# Patient Record
Sex: Male | Born: 1950 | Race: White | Hispanic: No | Smoking: Former smoker
Health system: Southern US, Community
[De-identification: ages and names within clinical notes are randomized; demographics above are authoritative.]

## PROBLEM LIST (undated history)

## (undated) DIAGNOSIS — F419 Anxiety disorder, unspecified: Secondary | ICD-10-CM

## (undated) DIAGNOSIS — N183 Chronic kidney disease, stage 3 unspecified: Secondary | ICD-10-CM

## (undated) DIAGNOSIS — IMO0001 Reserved for inherently not codable concepts without codable children: Secondary | ICD-10-CM

## (undated) DIAGNOSIS — I739 Peripheral vascular disease, unspecified: Secondary | ICD-10-CM

## (undated) DIAGNOSIS — Z95810 Presence of automatic (implantable) cardiac defibrillator: Secondary | ICD-10-CM

## (undated) DIAGNOSIS — I509 Heart failure, unspecified: Secondary | ICD-10-CM

## (undated) DIAGNOSIS — G629 Polyneuropathy, unspecified: Secondary | ICD-10-CM

## (undated) DIAGNOSIS — I2510 Atherosclerotic heart disease of native coronary artery without angina pectoris: Secondary | ICD-10-CM

## (undated) DIAGNOSIS — Z7901 Long term (current) use of anticoagulants: Secondary | ICD-10-CM

## (undated) DIAGNOSIS — I10 Essential (primary) hypertension: Secondary | ICD-10-CM

## (undated) DIAGNOSIS — E119 Type 2 diabetes mellitus without complications: Secondary | ICD-10-CM

## (undated) DIAGNOSIS — I4821 Permanent atrial fibrillation: Secondary | ICD-10-CM

## (undated) DIAGNOSIS — I447 Left bundle-branch block, unspecified: Secondary | ICD-10-CM

## (undated) DIAGNOSIS — N289 Disorder of kidney and ureter, unspecified: Secondary | ICD-10-CM

## (undated) DIAGNOSIS — I429 Cardiomyopathy, unspecified: Secondary | ICD-10-CM

## (undated) HISTORY — DX: Presence of automatic (implantable) cardiac defibrillator: Z95.810

## (undated) HISTORY — DX: Polyneuropathy, unspecified: G62.9

---

## 1995-02-20 DIAGNOSIS — I739 Peripheral vascular disease, unspecified: Secondary | ICD-10-CM

## 1995-02-20 HISTORY — DX: Peripheral vascular disease, unspecified: I73.9

## 1996-03-02 HISTORY — PX: CORONARY ARTERY BYPASS GRAFT: SHX141

## 1996-06-19 HISTORY — PX: OTHER SURGICAL HISTORY: SHX169

## 1996-06-30 HISTORY — PX: OTHER SURGICAL HISTORY: SHX169

## 1997-12-06 ENCOUNTER — Ambulatory Visit (HOSPITAL_COMMUNITY): Admission: RE | Admit: 1997-12-06 | Discharge: 1997-12-06 | Payer: Self-pay | Admitting: Family Medicine

## 1998-01-10 ENCOUNTER — Ambulatory Visit (HOSPITAL_COMMUNITY): Admission: RE | Admit: 1998-01-10 | Discharge: 1998-01-10 | Payer: Self-pay | Admitting: Family Medicine

## 1998-03-04 ENCOUNTER — Ambulatory Visit (HOSPITAL_COMMUNITY): Admission: RE | Admit: 1998-03-04 | Discharge: 1998-03-04 | Payer: Self-pay | Admitting: Family Medicine

## 1998-04-04 ENCOUNTER — Encounter (HOSPITAL_COMMUNITY): Admission: RE | Admit: 1998-04-04 | Discharge: 1998-07-03 | Payer: Self-pay | Admitting: Family Medicine

## 2002-08-19 ENCOUNTER — Ambulatory Visit (HOSPITAL_COMMUNITY): Admission: RE | Admit: 2002-08-19 | Discharge: 2002-08-19 | Payer: Self-pay | Admitting: *Deleted

## 2004-08-02 ENCOUNTER — Ambulatory Visit: Payer: Self-pay | Admitting: Oncology

## 2004-11-22 ENCOUNTER — Ambulatory Visit: Payer: Self-pay | Admitting: Oncology

## 2005-05-23 ENCOUNTER — Ambulatory Visit: Payer: Self-pay | Admitting: Oncology

## 2006-04-13 ENCOUNTER — Emergency Department (HOSPITAL_COMMUNITY): Admission: EM | Admit: 2006-04-13 | Discharge: 2006-04-14 | Payer: Self-pay | Admitting: Emergency Medicine

## 2007-06-27 ENCOUNTER — Inpatient Hospital Stay (HOSPITAL_COMMUNITY): Admission: AD | Admit: 2007-06-27 | Discharge: 2007-07-02 | Payer: Self-pay | Admitting: Cardiovascular Disease

## 2007-06-30 ENCOUNTER — Encounter (INDEPENDENT_AMBULATORY_CARE_PROVIDER_SITE_OTHER): Payer: Self-pay | Admitting: Cardiovascular Disease

## 2007-07-02 ENCOUNTER — Ambulatory Visit: Payer: Self-pay | Admitting: Psychiatry

## 2007-10-21 ENCOUNTER — Ambulatory Visit (HOSPITAL_COMMUNITY): Admission: RE | Admit: 2007-10-21 | Discharge: 2007-10-21 | Payer: Self-pay | Admitting: Cardiovascular Disease

## 2010-07-04 NOTE — Consult Note (Signed)
NAME:  Maxwell Fitzgerald, Maxwell Fitzgerald                 ACCOUNT NO.:  [id number]   MEDICAL RECORD NO.:  [id number]          PATIENT TYPE:  INP   LOCATION:  3734                         FACILITY:  [facility]   PHYSICIAN:  Anselm Jungling, MD  DATE OF BIRTH:  11-Jun-1950   DATE OF CONSULTATION:  07/02/2007  DATE OF DISCHARGE:  07/02/2007                                 CONSULTATION   IDENTIFYING DATA AND REASON FOR REFERRAL:  The patient is a 60 year old  divorced white male, currently under the care of Dr. Alanda Amass here at  [facility].  He was admitted due to atrial fibrillation with  rapid ventricular response.  Psychiatric consultation is requested  because of concerns about depression and possible suicidal ideation.   HISTORY OF PRESENTING PROBLEMS:  The following information is obtained  from his caregivers here, the [facility] chart and the patient himself,  who appears to be a reliable historian.   The patient has had numerous psychosocial stressors recently in addition  to his medical history, which includes adult onset diabetes mellitus,  coronary artery disease, past coronary artery bypass graft,  hyperlipidemia and the above referenced atrial fibrillation.  He and his  wife recently separated, but then reunited just a short while ago.  She  has had medical problems of her own and requires a significant amount of  caretaking on his part.  Together, they have two adult children and one  grandchild.  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  He has no health  insurance.   His primary care physician recently prescribed Ativan for him to help  him sleep, but at this time the patient is of the opinion that he  probably warrants treatment with an antidepressant medication.  There is  a positive family history of depression.  He has never been treated with  an antidepressant in the past.   With respect to the concerns  about suicidal ideation, the patient  explains, in quite a lot of detail, how 2-3 weeks ago he was feeling  much more down and hopeless about his overall situation, and he did  indeed make some comments at least twice to different persons he is  close to, indicating that he had had thoughts of taking a whole bunch  of pills and ending it all.  He states, however, that that feeling was  short-lived, and since then, he has had more reasons to feel hopeful and  positive about his situation.  He states that yesterday he started to  feel good again physically, and enjoyed walking around the hospital,  and visiting the solarium.  He has had positive contacts on the phone  with his daughter in [place], who is a health care professional and who  has been very supportive.  He states I have always had a positive  outlook on life.  He goes on to quote [employer], a statement  about the measure of a man being best gauged at the times they are  facing adversity.  He also  discusses the dire and adverse consequences  to his children and his wife in the event of suicide, and his love for  his 78-year-old grandchild.  He indicates that he does want to reach out  and get help for the stresses and challenges that he has facing him.   MENTAL STATUS AND OBSERVATIONS:  The patient is a well-nourished,  normally-developed adult male who was awake, alert and fully oriented.  He is very pleasant and an excellent historian.  He is articulate and of  above average intelligence and education.  His thoughts and speech are  normally organized.  There is nothing to suggest any underlying thought  disorder, cognitive or memory impairment.  He does not appear to be  clinically depressed.  His mood appears to be neutral with appropriate  affect.  As referenced above, he denies any suicidal ideation, plan or  intent at this time quite convincingly and sincerely and verbalizes a  strong desire for help.   IMPRESSION:   The patient is a 60 year old separated male with multiple  stressors and health conditions that have been further distressing to  him.  He was having some fleeting thoughts of suicide a couple of weeks  ago but since then he has begun to feel better but still verbalizes a  need for help and recognizes that help is available.   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  AXIS II:  Deferred.  AXIS III:  History of atrial fibrillation, diabetes mellitus, coronary  artery disease, hyperlipidemia.  AXIS IV:  Stressors severe.  AXIS V:  GAF 55.   RECOMMENDATIONS:  The patient and I discussed a trial of antidepressant  medication.  I recommended an SSRI antidepressant and he would like to  have one that he can purchase on a generic basis, since he has no health  insurance in force.  I suggested a trial of generic Zoloft or Prozac.   The patient can approach [facility] regarding the  possibility of psychotherapy services at reduced cost.   He appears to have very good supports.  In fact, as I was getting ready  to leave the pastor from his church came to visit and it appeared that  they were going to have a very good visit.   I have no other specific recommendations at this time.  I do not feel  that the one-to-one sitter is necessary.  Please return to the patient  his personal belongings and allow him to have a phone in his room.   I have no other specific recommendations.  Thank you for involving me in  this patient's care.  Cell phone [phone].      Anselm Jungling, MD  Electronically Signed     SPB/MEDQ  D:  07/02/2007  T:  07/02/2007  Job:  [id number]

## 2010-07-04 NOTE — H&P (Signed)
NAMEDELOIS, TOLBERT                 ACCOUNT NO.:  [id number]   MEDICAL RECORD NO.:  [id number]           PATIENT TYPE:   LOCATION:                                 FACILITY:   PHYSICIAN:  Richard A. Alanda Amass, M.D.  DATE OF BIRTH:   DATE OF ADMISSION:  DATE OF DISCHARGE:                              HISTORY & PHYSICAL   Mr. Oyama is a 60 year old separated and recently back together with his  estranged ill wife, white father of two with one grandchild. He quit  smoking 11 years ago. He has a history of AODM, coronary disease, remote  CABG, PAF, hyperlipidemia, and chronic anxiety and depression.   He is admitted now for symptomatic recurrent atrial fibrillation with  rapid ventricular response.   Joe has had significant stress problems, particularly over the last six  months. He is now in the middle of personal and corporate bankruptcy. He  has no health insurance, and he owes over $380,000. 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 He has been depressed, anxious, and got in touch with  Dr. Blair Heys office, and he was given Ativan to take 1 mg daily and also  started on an antidepressant as an outpatient, but he did not bring  this.   Over the last several weeks, he has had intermittent palpitations,  weakness and fatigue with no syncope or presyncope. He started having  persistent atrial fibrillation probable last night or today with  palpitations and fatigue. He has not had any chest pain or nitroglycerin  use. He is extremely anxious and distraught about his current situation  and need for hospitalization.   He has had a past episode of atrial fibrillation in February of 2008 and  was seen in the emergency room, and he was started with IV Cardizem. He  converted, and he was allowed to go home from there, starting on  Coumadin.   He was last seen by me November 2008 and had been taken off of his  Coumadin  since he had no atrial fibrillation from April 2008 to November  2008. He has not had any cerebral symptoms and nothing to suggest TIA or  CVA.   Gabriel Rung is status post remote CABG in 1998 by Dr. Andrey Campanile. He has not had  recatheterization since that time.   Cardiolite of March 2008 showed a low to intermediate risk scan with  septal and anterior wall suggested ischemia. EF was 41%. We did not  recommend catheterization because he was asymptomatic and doing well on  medical therapy.   He has not had a recent echocardiogram. There has not been any overt CHF  now or in the past.   He has had prior bilateral SVA PTAs February 18, 1996 with good long  term result. He had moderate left common, right external iliac disease  at that time.   He has asymptomatic carotid bruits and last carotid Dopplers were  December 2005 that showed no significant stenosis. He has not had recent  outpatient lower extremity Dopplers  because of cost considerations, but  he has not complained of any claudication.   He has recently moved back in with his estranged wife, and he is helping  to take care of her.   CURRENT MEDICATIONS:  1. Metformin 1 g b.i.d.  2. Actos 22.5 daily.  3. Lopressor 50 b.i.d.  4. Vasotec 30.  5. Zocor 20.  6. Slow Niacin 500 b.i.d.  7. Fish oil.  8. Chondroitin.  9. Zetia 10.  10.Two baby aspirin.  11.Norvasc 5.  12.Amaryl 2 mg b.i.d.  13.Lorazepam 1 mg daily.   The last lab I had available from November 2008 shows an H&H of 11.5/32,  platelet count of 204, PSA 1.3, A1c of 5.8.   BUN and creatinine were 33/1.5 with normal LFTs. Lipids showed  cholesterol 126, LDL 60, and HDL 38.   On exam, he is quite upset and anxious and does not want to come into  the hospital. I have explained to him the risks, benefits, and  alternatives, and I particularly explained the significant risk of  stroke associated with his atrial fibrillation along with current  symptoms and relatively low  blood pressure with atrial fibrillation with  rapid ventricular response.   He has chronic left bundle branch block with secondary ST-T wave  abnormalities.   There is no CHF on exam. Initial blood pressure was 150/70 by the  nurses. By me, it was 105/60. He is atrial fibrillation with rapid  ventricular response at 110 to 130 per minute.  The chest showed diminished breath sounds but no wheezes, rubs, or  rhonchi. PMI is in the left 6th ICS in the MCL, poorly sustained. There  is a tight paradoxical splitting of the second heart sound, a short 2/6  systolic murmur at the left sternal border. There is no diastolic murmur  or rubs. There is soft, bilateral carotid bruits. Thyroid is not  enlarged.  Liver, spleen and kidneys are not palpable.  Femorals are intact plus 2 on the left, plus 1 on the right. DP and PT  are palpable. There are no lower extremity ulcerations and no calf  tenderness. There are no pathologic reflexes or tremor. No active  arthritis or rash. He is fair skinned. Bearded.   EKG shows left bundle branch block with secondary ST-T changes, atrial  fibrillation with rapid ventricular response.   The patient is admitted to the hospital for rate control,  anticoagulation, and ancillary therapy. Fortunately, he is not having  any angina. He has associated comorbid conditions as outlined. He will  probably be agitating to go home very soon from the hospital, and he  might be able to put on Lovenox until his Coumadin is therapeutic. Since  he is not in hemodynamic problem except for borderline blood pressure at  present, I believe rate control will be adequate at present, and we can  consider possibility of cardioversion in the future. I would anticipate  keeping him in the hospital at least over the weekend or slightly  longer. However, once again, I am sure he will agitate to go home sooner  because of anxiety and cost considerations. He will need to be on  antianxiety  agents in the hospital as well, and we need to check a 2-D  echocardiogram in the hospital along with thyroid functions and his  diabetes.   ADMISSION DIAGNOSES:  1. Recurrent atrial fibrillation with rapid ventricular response -      symptomatic with low output symptoms. No chest  pain.  2. Left bundle branch block - chronic.  3. Coronary artery disease with coronary artery bypass grafting in      1998 - Dr. Andrey Campanile.  4. Mild ischemia on Cardiolite March 2008 without angina - low to      intermittent risk, treated medically.  5. Decreased ejection fraction 41% on last Cardiolite. No overt      congestive heart failure.  6. Remote bilateral percutaneous transluminal angioplasty May 1998.  7. Adult-onset diabetes mellitus.  8. Hyperlipidemia.  9. Severe anxiety and depression.  10.Hyperlipidemia.      Richard A. Alanda Amass, M.D.  Electronically Signed     RAW/MEDQ  D:  06/27/2007  T:  06/27/2007  Job:  [id number]

## 2010-07-04 NOTE — Discharge Summary (Signed)
Maxwell Fitzgerald, Maxwell Fitzgerald                 ACCOUNT NO.:  [id number]   MEDICAL RECORD NO.:  [id number]          PATIENT TYPE:  INP   LOCATION:  3734                         FACILITY:  [facility]   PHYSICIAN:  Maxwell Fitzgerald, M.D.DATE OF BIRTH:  1950/04/01   DATE OF ADMISSION:  06/27/2007  DATE OF DISCHARGE:  07/02/2007                               DISCHARGE SUMMARY   DISCHARGE DIAGNOSES:  1. Atrial fibrillation, controlled ventricular response.  2. Coumadin, initiated at this admission.  3. Coronary disease, coronary artery bypass grafting in 1998 by Dr.      Andrey Fitzgerald with mild ischemia and Cardiolite on March of 2008, low-to-      intermediate risks to be treated medically.  4. Ejection fraction of 45-50%.  5. Peripheral vascular disease with prior PTA.  6. Non-insulin-dependent diabetes.  7. Treated dyslipidemia.  8. Severe anxiety and depression, the patient was seen by Dr. Electa Fitzgerald      from [facility] in this admission.   HOSPITAL COURSE:  Maxwell Fitzgerald is a 60 year old male with history of  coronary disease, dyslipidemia, and problems with anxiety and depression  as noted above.  He has had a lot of personal stress including a  corporate bankruptcy and divorce.  He has no health insurance and has  huge health bills.  He was seen in the office by Dr. Alanda Fitzgerald on Jun 27, 2007, with intermittent palpitations, weakness, and fatigue.  He was  found to be in atrial fibrillation.  He had previous atrial fibrillation  in February 2008, and converted with Cardizem.  He had been on Coumadin  for the time, but this was later stopped in November 2008, and he did  not have recurrence of atrial fibrillation.  He has not had any symptoms  of TIA or CVA.  He was seen by Dr. Alanda Fitzgerald and admitted to telemetry  for plans for heparin and Coumadin and rate control.  During his  hospitalization, he had apparently complained to his family that he was  at the end of his rope and had mentioned  to them that he may be hoarding  some of his medicines to end it all.  We asked Dr. Electa Fitzgerald to see him.  He was placed on safety precautions overnight until he could be seen.  The patient was interviewed by Dr. Electa Fitzgerald on Jul 02, 2007.  His  dictation is pending, but he did indicate in a note that he felt the  patient could go home.  He suggested Zoloft or Prozac, whichever was  more affordable.  If he continues to have problems, that we may want to  refer him to [facility].  His INR on the 13th is 2.2,  and we feel like he can be discharged.   DISCHARGE MEDICATIONS:  1. Warfarin 5 mg a day with 7.5 on Monday, Wednesday, and Friday.  2,  Diltiazem CD 240 daily.  1. Metoprolol 50 mg b.i.d.  2. Enalapril 20 mg in the morning 10 mg in the p.m.  3. Simvastatin has been increased to 40 mg a day,  and we stopped the      Zetia for cost reasons.  4. Niacin 500 mg twice a day.  5. Actos 22.5 mg once a day.  6. Glucophage 1 g twice a day.  7. Amaryl 2 mg twice a day.  8. Prozac 20 mg a day at night.   LABORATORY DATA:  Echocardiogram in this admission showed the patient's  EF to be 40-50%; white count 6.8, hemoglobin 10.1, hematocrit 29.3, and  platelets 175.  Sodium 137, potassium 4.2, BUN 28, and creatinine 1.5.  CK-MB and troponins were negative.  Cholesterol is 97, LDL 56, and HDL  32.  Iron levels 40 and B12 levels 282.  Free thyroid index 3.4.  TSH  0.86.  Chest x-ray, no acute disease.  INR at discharge is 2.2.   DISPOSITION:  The patient is discharged in stable condition in atrial  fibrillation with controlled ventricular response.  He has a baseline  left bundle-branch block.  We will have him get a protime on Monday and  see Dr. Alanda Fitzgerald in followup.      Abelino Derrick, P.A.      Maxwell Fitzgerald, M.D.  Electronically Signed    LKK/MEDQ  D:  07/02/2007  T:  07/03/2007  Job:  [id number]   cc:   Maxwell Fitzgerald, M.D.  Quita Skye Artis Flock, M.D.

## 2010-07-04 NOTE — Cardiovascular Report (Signed)
NAMEYAPHET, SMETHURST                 ACCOUNT NO.:  [id number]   MEDICAL RECORD NO.:  [id number]          PATIENT TYPE:  OIB   LOCATION:  2899                         FACILITY:  [facility]   PHYSICIAN:  Richard A. Alanda Amass, M.D.DATE OF BIRTH:  07-21-1950   DATE OF PROCEDURE:  DATE OF DISCHARGE:  10/21/2007                            CARDIAC CATHETERIZATION   DIRECT CURRENT (DC) CARDIOVERSION   BRIEF HISTORY:  Mr. Busby is a 60 year old white married father of two  with one grandchild.  He had been separated from his wife but is back  with him now.  There is a significant complex social history involving  debt and bankruptcy and lack of insurance.  We have tried to work with  him.  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   Gabriel Rung has a history of coronary artery disease and remote CABG in 1998 by  Dr. Andrey Campanile.  He has not had any recurrent angina or symptoms of ischemia  although we had a borderline Cardiolite.  A year ago (March 2008), he  was asymptomatic and he declined recatheterization.  He has been stable  on medical therapy.   He has had remote bilateral SFA PTA in 1997 with good long-term result  without recurrence.   The patient has a history of chronic left bundle branch block,  hyperlipidemia, and AODM.  In February 2008, he had an episode of atrial  fibrillation requiring brief hospitalization.  He converted  spontaneously in the hospital and remained on medical therapy including  beta and calcium blockers.  He was treated with Coumadin, however, this  was continued as an outpatient approximately 3 months later when he  maintained sinus rhythm and was stable clinically.  He did well until  May 2009 when he was rehospitalized for atrial fibrillation with rapid  ventricular response that was symptomatic with palpitations and  borderline CHF.  He was admitted to the hospital, anticoagulated with  heparin, and switched  over to Coumadin, and treated with rate control.  He has been followed as an outpatient since that time.  We have  discussed the risks, benefits and alternatives to cardioversion for his  atrial fibrillation and he is elected to proceed with DC cardioversion.  His INRs were therapeutic as an outpatient.  He has chronic mild renal  insufficiency with proteinuria but creatinine is down to 1.4 in October 20, 2007, INRs are therapeutic and electrolytes are normal.  Underlying  rhythm shows atrial fibrillation with ventricular response of 60-70,  left bundle branch block and occasional PVCs.  The patient is on medical  therapy with Vasotec 10 b.i.d., Lopressor 50 b.i.d., Zocor 40, Coumadin,  Amaryl 2 mg b.i.d., Cardizem CD 120, and Lantus insulin 18 units subcu  a.m. under the care of Dr. Artis Flock.   [facility].  Informed consent was obtained to proceed with this.   The patient was stable hemodynamically.  Blood pressure 130-140 and AF  with occasional PVCs.  DC  cardioversion was done under IV Pentothal  anesthesia administered by Dr. Randa Evens.  He received a total of 350 mg  IV Pentothal for the procedure.  Biphasic DC countershock was done with  AP paddles at a 100 joules, 200 joules which resulted in transient sinus  rhythm and then back into atrial fibrillation.  A third cardioversion of  200 joules was then done which again resulted in transient sinus rhythm  and reversion back to atrial fibrillation in less than 1-2 minutes.   A fourth shock was done with newly positioned anterior patches.  This  was unsuccessful in converting him to sinus rhythm.  He tolerated the  procedure well, was amnesic for the shocks and blood pressure was  stable.   We considered the possibility of attempting ibutilide cardioversion in  the CCU.  However, the patient has moderately prolonged QTC interval of  493 and QT interval of 430.   This was probably not appropriate at this  time because of risk of pro-arrhythmia in this setting.  We felt it was  best to leave him in atrial fibrillation with rate control and  anticoagulation and we would see him back as an outpatient.  I did have  a discussion with him about the alternatives to further attempts at DC  or pharmacologic cardioversion.  These include chronic maintenance of  atrial fibrillation with rate control and anticoagulation.  Other  alternatives include consideration for antiarrhythmic therapy.  Because  of underlying coronary artery disease, we would not want to use 1C  agents.  Sotalol would be a possibility, however, with some renal  insufficiency and mildly prolonged QT, there was some risk of  proarrhythmia.  Class II agents such as amiodarone or possibly  dronedarone if available could be considered but his age one would have  to consider long-term toxicity particularly of amiodarone although  possibly less with dronedarone.  The other possibility would be  consideration for AF ablation based on his clinical status and symptoms.   We will follow him very closely for any symptomatic coronary artery  disease and we have low threshold for re-catheterization should symptoms  occur.   We will plan to discharge him later today to make sure he does not  convert spontaneously post DC CV and we will plan to discharge him today  and see him back in followup.        Richard A. Alanda Amass, M.D.  Electronically Signed     RAW/MEDQ  D:  10/21/2007  T:  10/22/2007  Job:  [id number]   cc:   Quita Skye. Artis Flock, M.D.  Dyke Maes, M.D.

## 2010-07-07 NOTE — Op Note (Signed)
   NAME:  Maxwell Fitzgerald, Maxwell Fitzgerald                        ACCOUNT NO.:  [id number]   MEDICAL RECORD NO.:  [id number]                   PATIENT TYPE:  AMB   LOCATION:  ENDO                                 FACILITY:  [facility]   PHYSICIAN:  Georgiana Spinner, M.D.                 DATE OF BIRTH:  1950/05/15   DATE OF PROCEDURE:  DATE OF DISCHARGE:                                 OPERATIVE REPORT   PROCEDURE:  Colonoscopy.   ENDOSCOPIST:  Georgiana Spinner, M.D.   INDICATIONS:  Colon cancer screening.   ANESTHESIA:  Demerol 70, Versed 8 mg.   DESCRIPTION OF PROCEDURE:  With the patient mildly sedated in the left  lateral decubitus position, the Olympus videoscopic coloscope was inserted  into the rectum and a normal rectal exam was performed.  It passed easily  under direct vision into the cecum, identified by the ileocecal valve and  appendiceal orifice both of which were photographed.   From this point the colonoscope was slowly withdrawn taking circumferential  views of the clonic mucosa stopping only, then, in the rectum which appeared  normal on direct and retroflex view.  The endoscope was straightened and  withdrawn.  The patient's vital signs and pulse oximetry remained stable.  The patient tolerated the procedure well without apparent complications.   FINDINGS:  Occasional diverticula in the sigmoid colon, otherwise an  unremarkable colonoscopic examination.   PLAN:  Repeat examination in 5-10 years.                                               Georgiana Spinner, M.D.    GMO/MEDQ  D:  08/19/2002  T:  08/19/2002  Job:  [id number]   cc:   Quita Skye. Artis Flock, M.D.  [address], [address]  [place]  [place] [address]  Fax: [phone]

## 2012-03-19 ENCOUNTER — Other Ambulatory Visit (HOSPITAL_COMMUNITY): Payer: Self-pay | Admitting: Cardiovascular Disease

## 2012-03-19 DIAGNOSIS — I509 Heart failure, unspecified: Secondary | ICD-10-CM

## 2012-03-19 DIAGNOSIS — R0989 Other specified symptoms and signs involving the circulatory and respiratory systems: Secondary | ICD-10-CM

## 2012-03-27 ENCOUNTER — Ambulatory Visit (HOSPITAL_COMMUNITY): Payer: Self-pay

## 2012-03-27 ENCOUNTER — Encounter (HOSPITAL_COMMUNITY): Payer: Self-pay

## 2012-04-02 ENCOUNTER — Ambulatory Visit (HOSPITAL_COMMUNITY)
Admission: RE | Admit: 2012-04-02 | Discharge: 2012-04-02 | Disposition: A | Discharge status: Home / Self Care | Payer: BC Managed Care – PPO | Source: Ambulatory Visit | Attending: Cardiovascular Disease | Admitting: Cardiovascular Disease

## 2012-04-02 DIAGNOSIS — I509 Heart failure, unspecified: Secondary | ICD-10-CM | POA: Insufficient documentation

## 2012-04-02 DIAGNOSIS — I4891 Unspecified atrial fibrillation: Secondary | ICD-10-CM | POA: Insufficient documentation

## 2012-04-02 DIAGNOSIS — I379 Nonrheumatic pulmonary valve disorder, unspecified: Secondary | ICD-10-CM | POA: Insufficient documentation

## 2012-04-02 DIAGNOSIS — R0989 Other specified symptoms and signs involving the circulatory and respiratory systems: Secondary | ICD-10-CM

## 2012-04-02 DIAGNOSIS — I369 Nonrheumatic tricuspid valve disorder, unspecified: Secondary | ICD-10-CM | POA: Insufficient documentation

## 2012-04-02 DIAGNOSIS — I059 Rheumatic mitral valve disease, unspecified: Secondary | ICD-10-CM | POA: Insufficient documentation

## 2012-04-02 DIAGNOSIS — E119 Type 2 diabetes mellitus without complications: Secondary | ICD-10-CM | POA: Insufficient documentation

## 2012-04-02 DIAGNOSIS — I739 Peripheral vascular disease, unspecified: Secondary | ICD-10-CM | POA: Insufficient documentation

## 2012-04-02 NOTE — Progress Notes (Signed)
Carotid duplex complete Zaidy Absher 

## 2012-04-02 NOTE — Progress Notes (Signed)
Maxwell Fitzgerald   2D echo completed 04/02/2012.   Cindy Denetra Formoso, RDCS  

## 2012-04-30 ENCOUNTER — Other Ambulatory Visit (HOSPITAL_COMMUNITY): Payer: Self-pay | Admitting: Cardiovascular Disease

## 2012-04-30 DIAGNOSIS — I4891 Unspecified atrial fibrillation: Secondary | ICD-10-CM

## 2012-05-19 ENCOUNTER — Emergency Department (HOSPITAL_COMMUNITY): Payer: BC Managed Care – PPO

## 2012-05-19 ENCOUNTER — Inpatient Hospital Stay (HOSPITAL_COMMUNITY)
Admission: EM | Admit: 2012-05-19 | Discharge: 2012-05-21 | DRG: 544 | Disposition: A | Discharge status: Home / Self Care | Payer: BC Managed Care – PPO | Attending: Internal Medicine | Admitting: Internal Medicine

## 2012-05-19 ENCOUNTER — Encounter (HOSPITAL_COMMUNITY): Payer: Self-pay | Admitting: Emergency Medicine

## 2012-05-19 DIAGNOSIS — I50811 Acute right heart failure: Secondary | ICD-10-CM

## 2012-05-19 DIAGNOSIS — I2789 Other specified pulmonary heart diseases: Secondary | ICD-10-CM | POA: Diagnosis present

## 2012-05-19 DIAGNOSIS — I5042 Chronic combined systolic (congestive) and diastolic (congestive) heart failure: Secondary | ICD-10-CM | POA: Diagnosis present

## 2012-05-19 DIAGNOSIS — D509 Iron deficiency anemia, unspecified: Secondary | ICD-10-CM | POA: Diagnosis present

## 2012-05-19 DIAGNOSIS — R188 Other ascites: Secondary | ICD-10-CM | POA: Diagnosis present

## 2012-05-19 DIAGNOSIS — I130 Hypertensive heart and chronic kidney disease with heart failure and stage 1 through stage 4 chronic kidney disease, or unspecified chronic kidney disease: Principal | ICD-10-CM | POA: Diagnosis present

## 2012-05-19 DIAGNOSIS — E669 Obesity, unspecified: Secondary | ICD-10-CM | POA: Diagnosis present

## 2012-05-19 DIAGNOSIS — N289 Disorder of kidney and ureter, unspecified: Secondary | ICD-10-CM

## 2012-05-19 DIAGNOSIS — I4891 Unspecified atrial fibrillation: Secondary | ICD-10-CM

## 2012-05-19 DIAGNOSIS — I2589 Other forms of chronic ischemic heart disease: Secondary | ICD-10-CM | POA: Diagnosis present

## 2012-05-19 DIAGNOSIS — R0602 Shortness of breath: Secondary | ICD-10-CM

## 2012-05-19 DIAGNOSIS — Z79899 Other long term (current) drug therapy: Secondary | ICD-10-CM

## 2012-05-19 DIAGNOSIS — I4949 Other premature depolarization: Secondary | ICD-10-CM | POA: Diagnosis present

## 2012-05-19 DIAGNOSIS — Z87891 Personal history of nicotine dependence: Secondary | ICD-10-CM

## 2012-05-19 DIAGNOSIS — D696 Thrombocytopenia, unspecified: Secondary | ICD-10-CM | POA: Diagnosis present

## 2012-05-19 DIAGNOSIS — Z794 Long term (current) use of insulin: Secondary | ICD-10-CM

## 2012-05-19 DIAGNOSIS — E875 Hyperkalemia: Secondary | ICD-10-CM

## 2012-05-19 DIAGNOSIS — Z951 Presence of aortocoronary bypass graft: Secondary | ICD-10-CM

## 2012-05-19 DIAGNOSIS — J4489 Other specified chronic obstructive pulmonary disease: Secondary | ICD-10-CM | POA: Diagnosis present

## 2012-05-19 DIAGNOSIS — I1310 Hypertensive heart and chronic kidney disease without heart failure, with stage 1 through stage 4 chronic kidney disease, or unspecified chronic kidney disease: Secondary | ICD-10-CM

## 2012-05-19 DIAGNOSIS — I2510 Atherosclerotic heart disease of native coronary artery without angina pectoris: Secondary | ICD-10-CM

## 2012-05-19 DIAGNOSIS — E119 Type 2 diabetes mellitus without complications: Secondary | ICD-10-CM

## 2012-05-19 DIAGNOSIS — I5043 Acute on chronic combined systolic (congestive) and diastolic (congestive) heart failure: Secondary | ICD-10-CM

## 2012-05-19 DIAGNOSIS — Z7901 Long term (current) use of anticoagulants: Secondary | ICD-10-CM

## 2012-05-19 DIAGNOSIS — R197 Diarrhea, unspecified: Secondary | ICD-10-CM | POA: Diagnosis present

## 2012-05-19 DIAGNOSIS — N189 Chronic kidney disease, unspecified: Secondary | ICD-10-CM

## 2012-05-19 DIAGNOSIS — I429 Cardiomyopathy, unspecified: Secondary | ICD-10-CM

## 2012-05-19 DIAGNOSIS — I447 Left bundle-branch block, unspecified: Secondary | ICD-10-CM

## 2012-05-19 DIAGNOSIS — I739 Peripheral vascular disease, unspecified: Secondary | ICD-10-CM

## 2012-05-19 DIAGNOSIS — I509 Heart failure, unspecified: Secondary | ICD-10-CM

## 2012-05-19 DIAGNOSIS — J449 Chronic obstructive pulmonary disease, unspecified: Secondary | ICD-10-CM | POA: Diagnosis present

## 2012-05-19 DIAGNOSIS — Z7982 Long term (current) use of aspirin: Secondary | ICD-10-CM

## 2012-05-19 DIAGNOSIS — N179 Acute kidney failure, unspecified: Secondary | ICD-10-CM

## 2012-05-19 DIAGNOSIS — N19 Unspecified kidney failure: Secondary | ICD-10-CM

## 2012-05-19 DIAGNOSIS — E785 Hyperlipidemia, unspecified: Secondary | ICD-10-CM

## 2012-05-19 DIAGNOSIS — N183 Chronic kidney disease, stage 3 unspecified: Secondary | ICD-10-CM

## 2012-05-19 DIAGNOSIS — E1129 Type 2 diabetes mellitus with other diabetic kidney complication: Secondary | ICD-10-CM | POA: Diagnosis present

## 2012-05-19 DIAGNOSIS — I10 Essential (primary) hypertension: Secondary | ICD-10-CM

## 2012-05-19 HISTORY — DX: Cardiomyopathy, unspecified: I42.9

## 2012-05-19 HISTORY — DX: Long term (current) use of anticoagulants: Z79.01

## 2012-05-19 HISTORY — DX: Heart failure, unspecified: I50.9

## 2012-05-19 HISTORY — DX: Type 2 diabetes mellitus without complications: E11.9

## 2012-05-19 HISTORY — DX: Left bundle-branch block, unspecified: I44.7

## 2012-05-19 HISTORY — DX: Chronic kidney disease, stage 3 (moderate): N18.3

## 2012-05-19 HISTORY — DX: Chronic kidney disease, stage 3 unspecified: N18.30

## 2012-05-19 HISTORY — DX: Permanent atrial fibrillation: I48.21

## 2012-05-19 HISTORY — DX: Disorder of kidney and ureter, unspecified: N28.9

## 2012-05-19 HISTORY — DX: Peripheral vascular disease, unspecified: I73.9

## 2012-05-19 HISTORY — DX: Atherosclerotic heart disease of native coronary artery without angina pectoris: I25.10

## 2012-05-19 HISTORY — DX: Essential (primary) hypertension: I10

## 2012-05-19 LAB — BASIC METABOLIC PANEL
BUN: 70 mg/dL — ABNORMAL HIGH (ref 6–23)
Calcium: 8.6 mg/dL (ref 8.4–10.5)
Calcium: 9.7 mg/dL (ref 8.4–10.5)
Creatinine, Ser: 2.58 mg/dL — ABNORMAL HIGH (ref 0.50–1.35)
Creatinine, Ser: 2.43 mg/dL — ABNORMAL HIGH (ref 0.50–1.35)
GFR calc non Af Amer: 25 mL/min — ABNORMAL LOW (ref >90)
GFR calc non Af Amer: 27 mL/min — ABNORMAL LOW (ref >90)
Glucose, Bld: 106 mg/dL — ABNORMAL HIGH (ref 70–99)
Glucose, Bld: 59 mg/dL — ABNORMAL LOW (ref 70–99)
Sodium: 140 mEq/L (ref 135–145)
Sodium: 144 mEq/L (ref 135–145)

## 2012-05-19 LAB — URINALYSIS, MICROSCOPIC ONLY
Bilirubin Urine: NEGATIVE
Nitrite: NEGATIVE
Protein, ur: NEGATIVE mg/dL
Specific Gravity, Urine: 1.012 (ref 1.005–1.030)
Urobilinogen, UA: 0.2 mg/dL (ref 0.0–1.0)

## 2012-05-19 LAB — HEPATIC FUNCTION PANEL
ALT: 17 U/L (ref 0–53)
AST: 28 U/L (ref 0–37)
Albumin: 3.6 g/dL (ref 3.5–5.2)
Bilirubin, Direct: 0.1 mg/dL (ref 0.0–0.3)

## 2012-05-19 LAB — CK TOTAL AND CKMB (NOT AT ARMC)
CK, MB: 4 ng/mL (ref 0.3–4.0)
Total CK: 142 U/L (ref 7–232)

## 2012-05-19 LAB — TROPONIN I: Troponin I: <0.3 ng/mL (ref <0.30)

## 2012-05-19 LAB — POTASSIUM: Potassium: 5 mEq/L (ref 3.5–5.1)

## 2012-05-19 LAB — CBC
MCH: 31.1 pg (ref 26.0–34.0)
MCHC: 30.7 g/dL (ref 30.0–36.0)
Platelets: 139 10*3/uL — ABNORMAL LOW (ref 150–400)
RBC: 3.5 MIL/uL — ABNORMAL LOW (ref 4.22–5.81)
RDW: 15.6 % — ABNORMAL HIGH (ref 11.5–15.5)

## 2012-05-19 LAB — HEMOGLOBIN A1C: Mean Plasma Glucose: 157 mg/dL — ABNORMAL HIGH (ref <117)

## 2012-05-19 LAB — TSH: TSH: 1.23 u[IU]/mL (ref 0.350–4.500)

## 2012-05-19 LAB — PROTIME-INR: Prothrombin Time: 31.9 seconds — ABNORMAL HIGH (ref 11.6–15.2)

## 2012-05-19 MED ORDER — HEPARIN SODIUM (PORCINE) 5000 UNIT/ML IJ SOLN
5000.0000 [IU] | Freq: Three times a day (TID) | INTRAMUSCULAR | Status: DC
  Filled 2012-05-19 (×2): qty 1

## 2012-05-19 MED ORDER — SIMVASTATIN 40 MG PO TABS
40.0000 mg | ORAL_TABLET | Freq: Every evening | ORAL | Status: DC
  Filled 2012-05-19: qty 1

## 2012-05-19 MED ORDER — INSULIN ASPART 100 UNIT/ML ~~LOC~~ SOLN
0.0000 [IU] | Freq: Three times a day (TID) | SUBCUTANEOUS | Status: DC
Start: 2012-05-19 — End: 2012-05-21
  Administered 2012-05-20: 1 [IU] via SUBCUTANEOUS
  Administered 2012-05-20 – 2012-05-21 (×2): 2 [IU] via SUBCUTANEOUS

## 2012-05-19 MED ORDER — HYDROCODONE-ACETAMINOPHEN 5-325 MG PO TABS: 1.0000 | ORAL_TABLET | ORAL | Status: DC | PRN

## 2012-05-19 MED ORDER — GLIMEPIRIDE 1 MG PO TABS
1.0000 mg | ORAL_TABLET | Freq: Two times a day (BID) | ORAL | Status: DC
  Administered 2012-05-19 – 2012-05-20 (×2): 1 mg via ORAL
  Filled 2012-05-19 (×3): qty 1

## 2012-05-19 MED ORDER — ATORVASTATIN CALCIUM 20 MG PO TABS
20.0000 mg | ORAL_TABLET | Freq: Every day | ORAL | Status: DC
  Administered 2012-05-19 – 2012-05-20 (×2): 20 mg via ORAL
  Filled 2012-05-19 (×3): qty 1

## 2012-05-19 MED ORDER — SODIUM POLYSTYRENE SULFONATE 15 GM/60ML PO SUSP
15.0000 g | Freq: Once | ORAL | Status: DC
  Filled 2012-05-19: qty 60

## 2012-05-19 MED ORDER — SODIUM POLYSTYRENE SULFONATE 15 GM/60ML PO SUSP
30.0000 g | Freq: Once | ORAL | Status: AC
  Administered 2012-05-19: 30 g via ORAL
  Filled 2012-05-19: qty 60

## 2012-05-19 MED ORDER — ISOSORB DINITRATE-HYDRALAZINE 20-37.5 MG PO TABS
1.0000 | ORAL_TABLET | Freq: Three times a day (TID) | ORAL | Status: DC
  Administered 2012-05-19 – 2012-05-21 (×6): 1 via ORAL
  Filled 2012-05-19 (×7): qty 1

## 2012-05-19 MED ORDER — GUAIFENESIN-DM 100-10 MG/5ML PO SYRP: 5.0000 mL | ORAL_SOLUTION | ORAL | Status: DC | PRN

## 2012-05-19 MED ORDER — METOPROLOL TARTRATE 50 MG PO TABS
50.0000 mg | ORAL_TABLET | Freq: Two times a day (BID) | ORAL | Status: DC
  Filled 2012-05-19: qty 1

## 2012-05-19 MED ORDER — FUROSEMIDE 10 MG/ML IJ SOLN
40.0000 mg | Freq: Once | INTRAMUSCULAR | Status: AC
  Administered 2012-05-19: 40 mg via INTRAVENOUS
  Filled 2012-05-19: qty 4

## 2012-05-19 MED ORDER — ONDANSETRON HCL 4 MG/2ML IJ SOLN: 4.0000 mg | Freq: Four times a day (QID) | INTRAMUSCULAR | Status: DC | PRN

## 2012-05-19 MED ORDER — ALBUTEROL SULFATE (5 MG/ML) 0.5% IN NEBU: 2.5000 mg | INHALATION_SOLUTION | RESPIRATORY_TRACT | Status: DC | PRN

## 2012-05-19 MED ORDER — NITROGLYCERIN 2 % TD OINT
0.5000 [in_us] | TOPICAL_OINTMENT | Freq: Four times a day (QID) | TRANSDERMAL | Status: DC
  Administered 2012-05-19 – 2012-05-21 (×7): 0.5 [in_us] via TOPICAL
  Filled 2012-05-19: qty 30

## 2012-05-19 MED ORDER — POLYETHYLENE GLYCOL 3350 17 G PO PACK
17.0000 g | PACK | Freq: Every day | ORAL | Status: DC | PRN
  Filled 2012-05-19: qty 1

## 2012-05-19 MED ORDER — ONDANSETRON HCL 4 MG PO TABS: 4.0000 mg | ORAL_TABLET | Freq: Four times a day (QID) | ORAL | Status: DC | PRN

## 2012-05-19 MED ORDER — INSULIN REGULAR HUMAN 100 UNIT/ML IJ SOLN: 5.0000 [IU] | Freq: Once | INTRAMUSCULAR | Status: DC

## 2012-05-19 MED ORDER — METOPROLOL TARTRATE 25 MG PO TABS
37.5000 mg | ORAL_TABLET | Freq: Three times a day (TID) | ORAL | Status: DC
  Administered 2012-05-19 – 2012-05-21 (×6): 37.5 mg via ORAL
  Filled 2012-05-19 (×7): qty 1

## 2012-05-19 MED ORDER — ALBUTEROL SULFATE (5 MG/ML) 0.5% IN NEBU
2.5000 mg | INHALATION_SOLUTION | Freq: Once | RESPIRATORY_TRACT | Status: AC
  Administered 2012-05-19: 2.5 mg via RESPIRATORY_TRACT
  Filled 2012-05-19: qty 0.5

## 2012-05-19 MED ORDER — FUROSEMIDE 10 MG/ML IJ SOLN
40.0000 mg | Freq: Two times a day (BID) | INTRAMUSCULAR | Status: DC
  Administered 2012-05-19 – 2012-05-20 (×2): 40 mg via INTRAVENOUS
  Filled 2012-05-19 (×5): qty 4

## 2012-05-19 MED ORDER — ASPIRIN EC 81 MG PO TBEC
81.0000 mg | DELAYED_RELEASE_TABLET | Freq: Every day | ORAL | Status: DC
  Administered 2012-05-19 – 2012-05-20 (×2): 81 mg via ORAL
  Filled 2012-05-19 (×3): qty 1

## 2012-05-19 MED ORDER — DILTIAZEM HCL 60 MG PO TABS
60.0000 mg | ORAL_TABLET | Freq: Two times a day (BID) | ORAL | Status: DC
  Administered 2012-05-19 – 2012-05-21 (×4): 60 mg via ORAL
  Filled 2012-05-19 (×5): qty 1

## 2012-05-19 MED ORDER — DEXTROSE 50 % IV SOLN
25.0000 mL | Freq: Once | INTRAVENOUS | Status: AC
  Administered 2012-05-19: 25 mL via INTRAVENOUS
  Filled 2012-05-19: qty 50

## 2012-05-19 MED ORDER — SODIUM CHLORIDE 0.9 % IJ SOLN
3.0000 mL | Freq: Two times a day (BID) | INTRAMUSCULAR | Status: DC
  Administered 2012-05-19 – 2012-05-21 (×4): 3 mL via INTRAVENOUS

## 2012-05-19 MED ORDER — INSULIN ASPART 100 UNIT/ML ~~LOC~~ SOLN
5.0000 [IU] | Freq: Once | SUBCUTANEOUS | Status: AC
  Administered 2012-05-19: 5 [IU] via INTRAVENOUS
  Filled 2012-05-19: qty 1

## 2012-05-19 MED ORDER — METOPROLOL TARTRATE 1 MG/ML IV SOLN
5.0000 mg | Freq: Once | INTRAVENOUS | Status: AC
  Administered 2012-05-19: 5 mg via INTRAVENOUS
  Filled 2012-05-19: qty 5

## 2012-05-19 MED ORDER — SODIUM CHLORIDE 0.9 % IV SOLN
1.0000 g | Freq: Once | INTRAVENOUS | Status: AC
  Administered 2012-05-19: 1 g via INTRAVENOUS
  Filled 2012-05-19: qty 10

## 2012-05-19 MED ORDER — LORAZEPAM 0.5 MG PO TABS
0.5000 mg | ORAL_TABLET | Freq: Once | ORAL | Status: AC
  Administered 2012-05-19: 0.5 mg via ORAL
  Filled 2012-05-19: qty 1

## 2012-05-19 MED ORDER — INSULIN GLARGINE 100 UNIT/ML ~~LOC~~ SOLN
8.0000 [IU] | Freq: Every day | SUBCUTANEOUS | Status: DC
  Filled 2012-05-19: qty 0.08

## 2012-05-19 NOTE — Progress Notes (Signed)
Pt confirms pcp is Elias Else  EPIC updated

## 2012-05-19 NOTE — Progress Notes (Signed)
ANTICOAGULATION CONSULT NOTE - Initial Consult  Pharmacy Consult for:  Coumadin and IV Heparin if INR < 2 Indication:  Permanent atrial fibrillation  No Known Allergies  Patient Measurements: 3/31 - Ht. 70 inches, Wt. 96.7 kg   Vital Signs: Temp: 97.8 F (36.6 C) (03/31 1719) Temp src: Oral (03/31 1719) BP: 129/67 mmHg (03/31 1719) Pulse Rate: 102 (03/31 1719)  Labs:  Recent Labs  05/19/12 1220 05/19/12 1230  HGB 10.9*  --   HCT 35.5*  --   PLT 139*  --   LABPROT  --  31.9*  INR  --  3.33*  CREATININE 2.58*  --    Estimated Creatinine Clearance is 36.3 ml/min.  Medical History: Past Medical History  Diagnosis Date  . CHF (congestive heart failure)   . Renal disorder   . Type 2 diabetes mellitus   . COPD (chronic obstructive pulmonary disease)   . Coronary artery disease     s/p CABG 1998; Myoview 2008 mild septal ischemia -no cath,  . Hypertension   . Atrial fibrillation, permanent     on warfarin; recurred after DCCV  . PAD (peripheral artery disease) 1997    Bilateral SFA PTA in 1997  . Chronic anticoagulation     on coumadin  . LBBB (left bundle branch block)     chronic  . Cardiomyopathy, EF 30-35% by echo 03/2012 05/19/2012    Decreased from 2009 Echo, 40-45%  . CKD (chronic kidney disease) stage 3, GFR 30-59 ml/min     Baseline BUN/Cr 09/2011 - 36/1.44    Medications:  Scheduled:  . [COMPLETED] albuterol  2.5 mg Nebulization Once  . aspirin EC  81 mg Oral QHS  . atorvastatin  20 mg Oral q1800  . [COMPLETED] calcium gluconate  1 g Intravenous Once  . [COMPLETED] dextrose  25 mL Intravenous Once  . diltiazem  60 mg Oral Q12H  . [COMPLETED] furosemide  40 mg Intravenous Once  . furosemide  40 mg Intravenous BID  . glimepiride  1 mg Oral BID AC  . heparin  5,000 Units Subcutaneous Q8H  . insulin aspart  0-9 Units Subcutaneous TID WC  . [COMPLETED] insulin aspart  5 Units Intravenous Once  . [START ON 05/20/2012] insulin glargine  8 Units  Subcutaneous Daily  . metoprolol  50 mg Oral BID  . nitroGLYCERIN  0.5 inch Topical Q6H  . sodium chloride  3 mL Intravenous Q12H  . [COMPLETED] sodium polystyrene  30 g Oral Once  . [DISCONTINUED] insulin regular  5 Units Intravenous Once  . [DISCONTINUED] simvastatin  40 mg Oral QPM  . [DISCONTINUED] sodium polystyrene  15 g Oral Once    Assessment:  Asked to assist with anticoagulation management for this 62 year-old male, who is on chronic Coumadin therapy for atrial fibrillation.  The usual home dose is documented as 5 mg on Mon-Wed-Fri and 7.5 mg on all other days of the week.  The dose for today, 3/31, has already been taken.  The admission INR is supratherapeutic at 3.33.  The Cardiology note indicates that Coumadin has been placed on hold.  IV Heparin is to be started only if the INR falls below 2.  Goals of Therapy:  INR 2-3 Monitor platelets by anticoagulation protocol: Yes   Plan:   Hold Coumadin as requested per Cardiology note of 05/19/12.  Await orders to resume Coumadin when indicated.  Follow INR daily  Start IV Heparin if INR  < 2.  Polo Riley R.Ph. 05/19/2012  7:21 PM

## 2012-05-19 NOTE — ED Provider Notes (Signed)
History     CSN: [id number]  Arrival date & time 05/19/12  1137   None    Chief Complaint  Patient presents with  . Shortness of Breath  . Congestive Heart Failure  . Cough   (Consider location/radiation/quality/duration/timing/severity/associated sxs/prior treatment) Patient is a 62 y.o. male presenting with shortness of breath, CHF, and cough.  Shortness of Breath Associated symptoms: cough   Congestive Heart Failure Associated symptoms include coughing.  Cough Associated symptoms: shortness of breath     62 y/o m with PMHx significant for CAD s/p CABG, Afib on Coumadin, HTN, DM, CKD and recently diagnosed CHF presents with increase of baseline weight and SOB.  Pt was diagnosed with CHF in Feb/2014 and since then he has been on Lasix 40 mg and Kcl. He went to a wedding last weekend in [place] and since then he has noticed that his weight has increased not proportional to his oral intake. He reports his dry weight is 196 and today was 218. He also describes mild shortness of breath, non productive cough, orthopnea and swelling of his legs as well as his abdomen.  Pt denies palpitation, chest or abdominal pain, fever, chills, nausea or vomiting.  Last ECHO 03/2012 EF 30-35%  Past Medical History  Diagnosis Date  . CHF (congestive heart failure)   . Renal disorder   . Type 2 diabetes mellitus   . COPD (chronic obstructive pulmonary disease)   . Coronary artery disease   . Hypertension   . Atrial fibrillation     Past Surgical History  Procedure Laterality Date  . Coronary artery bypass graft      History reviewed. No pertinent family history.  History  Substance Use Topics  . Smoking status: Never Smoker   . Smokeless tobacco: Not on file  . Alcohol Use: Yes     Comment: 2 drinks/week    Review of Systems  Constitutional: Positive for unexpected weight change.  HENT: Negative.   Eyes: Negative.   Respiratory: Positive for cough and shortness of breath.    Cardiovascular: Positive for leg swelling.  Gastrointestinal: Positive for abdominal distention.  Genitourinary: Negative.   Musculoskeletal: Negative.   Skin: Negative.   Neurological: Negative.   Psychiatric/Behavioral: Negative.      Allergies  Review of patient's allergies indicates not on file.  Home Medications   Current Outpatient Rx  Name  Route  Sig  Dispense  Refill  . aspirin EC 81 MG tablet   Oral   Take 81 mg by mouth at bedtime.         Marland Kitchen diltiazem (CARDIZEM) 120 MG tablet   Oral   Take 60 mg by mouth 2 (two) times daily.         . enalapril (VASOTEC) 20 MG tablet   Oral   Take 20 mg by mouth 2 (two) times daily.         . furosemide (LASIX) 40 MG tablet   Oral   Take 40 mg by mouth daily with breakfast.         . glimepiride (AMARYL) 2 MG tablet   Oral   Take 2 mg by mouth 2 (two) times daily before lunch and supper.         Marland Kitchen glucosamine-chondroitin 500-400 MG tablet   Oral   Take 3 tablets by mouth daily.         . insulin glargine (LANTUS) 100 UNIT/ML injection   Subcutaneous   Inject 8 Units into the skin  daily.         . metoprolol (LOPRESSOR) 50 MG tablet   Oral   Take 50 mg by mouth 2 (two) times daily.         . pioglitazone (ACTOS) 45 MG tablet   Oral   Take 45 mg by mouth daily.         . potassium chloride SA (K-DUR,KLOR-CON) 20 MEQ tablet   Oral   Take 20 mEq by mouth daily with breakfast.         . simvastatin (ZOCOR) 40 MG tablet   Oral   Take 40 mg by mouth every evening.           BP 120/67  Pulse 78  Temp(Src) 98 F (36.7 C) (Oral)  Resp 21  SpO2 100%  Physical Exam  Constitutional: He is oriented to person, place, and time. No distress.  HENT:  Mouth/Throat: No oropharyngeal exudate.  Eyes: Conjunctivae and EOM are normal. Pupils are equal, round, and reactive to light.  Neck: Normal range of motion. Neck supple. JVD present.  Cardiovascular: Normal rate and normal heart sounds.  Exam  reveals no gallop.   No murmur heard. Irregularly irregular.  Pulmonary/Chest: He has no wheezes. He has rales.  Abdominal: Soft. Bowel sounds are normal. There is no tenderness.  Non tender hepatomegaly of 1 cm below R Costal border.   Musculoskeletal: He exhibits edema.  3+ pitting edema up to knees bilaterally.   Lymphadenopathy:    He has no cervical adenopathy.  Neurological: He is alert and oriented to person, place, and time.  Skin: Skin is warm and dry.  Psychiatric: He has a normal mood and affect.    ED Course  Procedures (including critical care time)  Labs Reviewed  PRO B NATRIURETIC PEPTIDE - Abnormal; Notable for the following:    Pro B Natriuretic peptide (BNP) 20098.0 (*)    All other components within normal limits  BASIC METABOLIC PANEL - Abnormal; Notable for the following:    Potassium 7.0 (*)    Glucose, Bld 106 (*)    BUN 70 (*)    Creatinine, Ser 2.58 (*)    GFR calc non Af Amer 25 (*)    GFR calc Af Amer 29 (*)    All other components within normal limits  CBC - Abnormal; Notable for the following:    RBC 3.50 (*)    Hemoglobin 10.9 (*)    HCT 35.5 (*)    MCV 101.4 (*)    RDW 15.6 (*)    Platelets 139 (*)    All other components within normal limits  PROTIME-INR   Dg Chest Port 1 View  05/19/2012  *RADIOLOGY REPORT*  Clinical Data: CHF, shortness of breath and cough.  PORTABLE CHEST - 1 VIEW  Comparison: 06/28/2007.  Findings: Trachea is midline.  Heart size is accentuated by AP technique.  There may be tiny bilateral pleural effusions.  Lungs are otherwise clear.  IMPRESSION: Question tiny bilateral pleural effusions.   Original Report Authenticated By: Leanna Battles, M.D.    No diagnosis found.  MDM  Pt clinically and labs showed CHF exacerbation (fluid oveload). Pt reports Cr baseline 1.5 and today is 2.68 and K 7.0 with no EKG changes. Pt has been taking Kcl at home since he started taking furosemide (about 3 weeks ago) Consulted with Dr.  Briant Cedar who is pt's Nephrologist and he recommends medical management here at [facility], stop Enalapril and Kcl and start Kayexalate and proper  diuresis.  We will discuss with [facility] for admission.          Other Atienza Piloto de Criselda Peaches, MD 05/19/12 1414

## 2012-05-19 NOTE — H&P (Addendum)
[facility]                                                                                    Patient Demographics  Maxwell Fitzgerald, is a 62 y.o. male  CSN: [id number]  MRN: [id number]  DOB - 03-22-1950  Admit Date - 05/19/2012  Outpatient Primary MD for the patient is Lolita Patella, MD Cardiologist [facility] Nephrologist Dr. Briant Cedar   With History of -  Past Medical History  Diagnosis Date  . CHF (congestive heart failure)   . Renal disorder   . Type 2 diabetes mellitus   . COPD (chronic obstructive pulmonary disease)   . Coronary artery disease   . Hypertension   . Atrial fibrillation       Past Surgical History  Procedure Laterality Date  . Coronary artery bypass graft      in for   Chief Complaint  Patient presents with  . Shortness of Breath  . Congestive Heart Failure  . Cough     HPI  Maxwell Fitzgerald  is a 62 y.o. male, with history of CAD status post CABG many years ago follows with [place] cardiology, chronic kidney disease stage III and for his renal problems with Dr. Briant Cedar, type 2 diabetes mellitus insulin-dependent, CHF which is systolic last EF 78% one month ago on echogram, history of atrial fibrillation on Coumadin, who has been experiencing about 28 pounds of weight gain for the last 6 weeks and he says this is the main reason why he came to the ER, also complains of some shortness of breath which has been present for the last 6 weeks, controlled 2 orthopnea, shortness of breath is worse on exertion better on rest and sitting up, no chest pain, no cough, also says that he has had profuse diarrhea since he was started on potassium supplementation 5 weeks ago, he tolerated oral Lasix well for 2 days and thereafter once potassium was added he developed profuse diarrhea.    Presented to the ER mainly for his 20 pound weight gain, in the ER he had evidence of some fluid overload, acute on chronic renal failure, severe  hyperkalemia, case was discussed with Dr. Briant Cedar patient's nephrologist and also the nephrologist on call by the ER physician who requested hospitalist admission.      Review of Systems    In addition to the HPI above,  No Fever-chills, No Headache, No changes with Vision or hearing, No problems swallowing food or Liquids, No Chest pain, no productive Cough , does have shortness of breath which is worse on lying flat and exertion however this has been present for 6 weeks and not much changed   No Abdominal pain, No Nausea or Vommitting, Bowel movements are regular, No Blood in stool or Urine, No dysuria, No new skin rashes or bruises, No new joints pains-aches,  No new weakness, tingling, numbness in any extremity, No recent weight   loss, but says has gained about 20 pounds of weight in the last 2 months No polyuria, polydypsia or polyphagia, No significant Mental Stressors.  A full 10 point Review of Systems was done, except as stated above, all  other Review of Systems were negative.   Social History History  Substance Use Topics  . Smoking status: Former Smoker    Quit date: 05/20/1995  . Smokeless tobacco: Never Used  . Alcohol Use: Yes     Comment: 2 drinks/week      Family History No history of CAD at young age and family members  Prior to Admission medications   Medication Sig Start Date End Date Taking? Authorizing Provider  aspirin EC 81 MG tablet Take 81 mg by mouth at bedtime.   Yes Historical Provider, MD  diltiazem (CARDIZEM) 120 MG tablet Take 60 mg by mouth 2 (two) times daily.   Yes Historical Provider, MD  enalapril (VASOTEC) 20 MG tablet Take 20 mg by mouth 2 (two) times daily.   Yes Historical Provider, MD  furosemide (LASIX) 40 MG tablet Take 40 mg by mouth daily with breakfast.   Yes Historical Provider, MD  glimepiride (AMARYL) 2 MG tablet Take 2 mg by mouth 2 (two) times daily before lunch and supper.   Yes Historical Provider, MD   glucosamine-chondroitin 500-400 MG tablet Take 3 tablets by mouth daily.   Yes Historical Provider, MD  insulin glargine (LANTUS) 100 UNIT/ML injection Inject 8 Units into the skin daily.   Yes Historical Provider, MD  metoprolol (LOPRESSOR) 50 MG tablet Take 50 mg by mouth 2 (two) times daily.   Yes Historical Provider, MD  pioglitazone (ACTOS) 45 MG tablet Take 45 mg by mouth daily.   Yes Historical Provider, MD  potassium chloride SA (K-DUR,KLOR-CON) 20 MEQ tablet Take 20 mEq by mouth daily with breakfast.   Yes Historical Provider, MD  simvastatin (ZOCOR) 40 MG tablet Take 40 mg by mouth every evening.   Yes Historical Provider, MD    No Known Allergies  Physical Exam  Vitals  Blood pressure 139/82, pulse 94, temperature 98 F (36.7 C), temperature source Oral, resp. rate 12, SpO2 100.00%.   1. General elderly white male lying in bed in NAD,    2. is somewhat irritated affect , intact insight, Not Suicidal or Homicidal, Awake Alert, Oriented X 3.  3. No F.N deficits, ALL C.Nerves Intact, Strength 5/5 all 4 extremities, Sensation intact all 4 extremities, Plantars down going.  4. Ears and Eyes appear Normal, Conjunctivae clear, PERRLA. Moist Oral Mucosa.  5. Supple Neck, + JVD, No cervical lymphadenopathy appriciated, No Carotid Bruits.  6. Symmetrical Chest wall movement, Good air movement bilaterally, few basilar rales  7. RRR, No Gallops, Rubs or Murmurs, No Parasternal Heave.  8. Positive Bowel Sounds, Abdomen Soft, Non tender, No organomegaly appriciated,No rebound -guarding or rigidity.  9.  No Cyanosis, Normal Skin Turgor, No Skin Rash or Bruise.1+ edema   10. Good muscle tone,  joints appear normal , no effusions, Normal ROM.  11. No Palpable Lymph Nodes in Neck or Axillae     Data Review  CBC  Recent Labs Lab 05/19/12 1220  WBC 6.4  HGB 10.9*  HCT 35.5*  PLT 139*  MCV 101.4*  MCH 31.1  MCHC 30.7  RDW 15.6*    ------------------------------------------------------------------------------------------------------------------  Chemistries   Recent Labs Lab 05/19/12 1220  NA 140  K 7.0*  CL 107  CO2 25  GLUCOSE 106*  BUN 70*  CREATININE 2.58*  CALCIUM 8.6   ------------------------------------------------------------------------------------------------------------------ CrCl is unknown because there is no height on file for the current visit. ------------------------------------------------------------------------------------------------------------------ No results found for this basename: TSH, T4TOTAL, FREET3, T3FREE, THYROIDAB,  in the last 72 hours  Coagulation profile  Recent Labs Lab 05/19/12 1230  INR 3.33*   ------------------------------------------------------------------------------------------------------------------- No results found for this basename: DDIMER,  in the last 72 hours -------------------------------------------------------------------------------------------------------------------  Cardiac Enzymes No results found for this basename: CK, CKMB, TROPONINI, MYOGLOBIN,  in the last 168 hours ------------------------------------------------------------------------------------------------------------------ No components found with this basename: POCBNP,    ---------------------------------------------------------------------------------------------------------------  Urinalysis No results found for this basename: colorurine, appearanceur, labspec, phurine, glucoseu, hgbur, bilirubinur, ketonesur, proteinur, urobilinogen, nitrite, leukocytesur    ----------------------------------------------------------------------------------------------------------------  Imaging results:   Dg Chest Port 1 View  05/19/2012  *RADIOLOGY REPORT*  Clinical Data: CHF, shortness of breath and cough.  PORTABLE CHEST - 1 VIEW  Comparison: 06/28/2007.  Findings: Trachea is  midline.  Heart size is accentuated by AP technique.  There may be tiny bilateral pleural effusions.  Lungs are otherwise clear.  IMPRESSION: Question tiny bilateral pleural effusions.   Original Report Authenticated By: Leanna Battles, M.D.     My personal review of EKG: Rhythm Afib, Rate  100s /min, intraventricular conduction delay     Echo Feb 2014  - AF with controlled VR. LBBB with moderate intra-ventricdular dyssynchrony. EF approximately 30-35%. The right ventricular systolic pressure was increased consistent with moderate pulmonary hypertension.     Assessment & Plan   1. 28 pound weight gain over 5 weeks, acute on chronic renal failure with hyperkalemia, diarrhea with potassium supplementation ongoing for 5 weeks also - plan is to admit the patient telemetry bed, hyperkalemia protocol has been followed including IV Lasix, Kayexalate, IV calcium gluconate, insulin and D50, we will monitor the patient on telemetry and repeat potassium. Renal diet with fluid and potassium restriction.   We'll also check renal ultrasound, urine sodium and creatinine, UA. He have discussed his case with Dr. Briant Cedar who agrees with the present plan and will see the patient soon.      2. Acute on chronic systolic CHF 35% on February 2014 echogram. Lungs are relatively clear, he does have JVD, I seriously think that there is a large component of right-sided heart failure to his weight gain edema and JVD, he does have pulmonary hypertension on his recent echo gram, we'll request cardiology to evaluate and comment on this. certainly at this time A/P RB will be held due to acute renal failure and hyperkalemia. Blockers to be continued. Will apply gentle nitro paste. Fluid restriction.      3.DM-2. A1c, continue home dose Lantus, Will hold Actos as could worsen his heart failure, reduce Amaryl does as he is in acute renal failure, sliding scale low dose NovoLog insulin with meals.     4. Atrial  fibrillation goal would be rate control, home dose rate controlling agents will be continued, Coumadin per pharmacy.     5. Hypertension stable home medications to continue except his ACE/ARB.     6.H/O CAD - post CABG pain-free no acute issue.      7. History of diarrhea since he started potassium supplementation. Potassium supplementation will be stopped, patient denies any antibiotic exposure in the last 6 months. If diarrhea does not resolve we will do further stool studies.     DVT Prophylaxis Heparin   AM Labs Ordered, also please review Full Orders  Family Communication: Admission, patients condition and plan of care including tests being ordered have been discussed with the patient  who indicates understanding and agree with the plan and Code Status.  Code Status Full  Likely DC to  Home  Time spent in minutes :  45  Condition Marinell Blight K M.D on 05/19/2012 at 3:20 PM  Between 7am to 7pm - Pager - [phone]  After 7pm go to [email or web address] - password [email or web address]  And look for the night coverage person covering me after hours  [facility] Office  [phone]

## 2012-05-19 NOTE — Consult Note (Addendum)
Reason for Consult: CHF, afib   Referring Physician: [facility]  Cardiologist: Dr. Alanda Amass PCP: Dr. Elias Else   Chief Complaint: increasing Edema    ATTENDING ATTESTATION:  I have seen and examined the patient along with Nada Boozer (PA, NP).  I have reviewed the chart, notes and new data.  I agree with her's findings, examination & recommendations as noted above.  Brief Description: Very poor insight in this gentleman with long standing CAD-CABG, DM & CKD along with Chronic Afib.  Was seen by Dr. Alanda Amass (RAW) + in Jan for increased Wgt & SOB --> started on PO Lasix + K+ --> Echo in Feb showed decreased EF to 30-35% from 40-45%, with no further adjustment that I could see, or the pt noted (despite regaining ~1/2 of the wgt that he lost with Lasix).  Over the past ~2 weeks, he thinks he has gained ~1lb a day.     Key new complaints: edema, increased abdominal girth, DOE, but no PND/orthopnea. No CP.  Key examination changes: JVP unable to be evaluated due to body habitus, but pulsatile waves appear to be up to the earlobes (~18 cm H20) with HJR & pulsatile liver; notably increased abdominal girth with ascites & sacral/abdominal wall edema (~borderline anasarca - with ~1-2+ L>R LE); lungs are remarkably clear; tachycardic Irregularly Irregular rhythm, too fast to hear R/G - soft SM that may well be TR, slight laterally displaced PMI.  Key new findings / data: Gross lab abnormalities reviewed.  ECG with Afib & borderline rate response, IVDC in LBBB type pattern (similar to OP ECG).  I suspect that his presentation is c/w indolent ~acute on chronic Systolic/Diastolic HF with a predominant feature of Right Sides HF in the absence of PND/Orhtopnea.  His Acute on Chronic Renal Failure is likely a combination of decreased Cardiac output & congestive nephropathy = Cardiorenal syndrome. He misunderstands that his diarrhea currently is more related to Kayexalate.  Principal Problem:   ARF (acute  renal failure) Active Problems:   Acute on chronic combined systolic and diastolic congestive heart failure, NYHA class 3   Hyperkalemia   Cardiorenal syndrome with renal failure - acute on chronic renal failure with worsening HF.   Atrial fibrillation with rapid ventricular response   Acute on chronic right-sided HF (heart failure)   Type 2 diabetes mellitus   Coronary artery disease, hx of CABG 1998, LIMA-LAD;VG-OM1-OM2-OM3; VG-PDA/PLA   Atrial fibrillation, permanent   Hypertension   Cardiomyopathy, EF 30-35% by echo 03/2012  PLAN:  I agree with IV Diuresis (which will also help K+); expect renal function to normalize as we get him back on the Starling curve.  Hold ACE-I until renal function improves -- will use hydralazine/nitrates for afterload reduction in the interim.  No PO K+  The plan per Dr. Cloyde Reams was OP Myoview to evaluate for an ischemic or infarct etiology for reduced EF.  At this rate I think that he would ultimately benefit from R&LHC with grafts & native angiography.    To make things worse, his chronic Afib is proving quite difficult to rate control -- will give IV Lopressor x 1 now & change to 37.5 mg tid PO Lopressor.  Not usually in favor of adjusting BB in the setting of decompensated HF, but we are   Agree with holding warfarin as INR up. Check LFTs with next set of chemistry to assess for congestive hepatopathy.  Marykay Lex, M.D., M.S. [facility] [address]  [place], [place]  [address]  [phone] _____________________________  HPI: 62 year old Fitzgerald, father of 2 with history of CAD status post CABG 1998 follows with Dr. Alanda Amass, chronic kidney disease stage III followed by Dr. Briant Cedar, type 2 diabetes mellitus insulin-dependent, CHF which is systolic last EF 78% one month ago on echogram, history of permanent atrial fibrillation on Coumadin, who has been experiencing about 28 pounds of weight gain for the last  6 weeks and he says this is the main reason why he came to the ER.  Also complains of some shortness of breath which has been present for the last 6 weeks,  orthopnea, shortness of breath is worse on exertion better on rest and sitting up, no chest pain, no cough, also says that he has had profuse diarrhea since he was started on potassium supplementation 5 weeks ago, he tolerated oral Lasix well for 2 days and thereafter once potassium was added he developed profuse diarrhea. In the ER he had evidence of some fluid overload, acute on chronic renal failure, severe hyperkalemia, case was discussed with Dr. Edd Arbour group.     K+ 7.0, Cr 2.58 when in 09/2011 his BUN was 36 and Cr. Of 1.44.  Pro BNP was 20,098. plts 139, INR 3.33.   Pt was scheduled for a lexiscan myoview for 05/28/12 to eval for ischemia with decreasing EF.  Last nuc 2008 3/08 with EF 41% with moderate LV dilatation with probable inf. scar from base to apex.  Could not exclude septal ischemia or mild anterolateral ischemia. Test was done to eval grafts and at that time pt. could not afford a cath and was not symptomatic.  He has done well until recently.  In Jan he complained of swelling and DOE, he was started on Lasix 40 mg daily and K+ 20 meq daily.  He reported improvement on last office visit. But since 04/29/12 his swelling returned and increased while in [place] at his daughter's wedding.  His diarrhea began there, and he had 2 episodes of dizziness during the festivities, with the last spell he passed out but also with explosive diarrhea.  Continued to swell since return this week.  Other history includes remote bilateral SFA PTA in 1997 with good long-term result without recurrence. Chronic LBBB -chronic a fib since failed cardioversion 2009 on chronic anticoagulation with coumadin.    [facility] was consulted to assist in management of Acute on Chronic combined Systolic/Diastolic HF, Right Heart Failure with cardiorenal syndrome & permanent  Afib, now with RVR.   Past Medical History  Diagnosis Date  . CHF (congestive heart failure)   . Renal disorder   . Type 2 diabetes mellitus   . COPD (chronic obstructive pulmonary disease)   . Coronary artery disease     s/p CABG 1998; Myoview 2008 mild septal ischemia -no cath,  . Hypertension   . Atrial fibrillation, permanent     on warfarin; recurred after DCCV  . PAD (peripheral artery disease) 1997    Bilateral SFA PTA in 1997  . Chronic anticoagulation     on coumadin  . LBBB (left bundle branch block)     chronic  . Cardiomyopathy, EF 30-35% by echo 03/2012 05/19/2012    Decreased from 2009 Echo, 40-45%  . CKD (chronic kidney disease) stage 3, GFR 30-59 ml/min     Baseline BUN/Cr 09/2011 - 36/1.44    Past Surgical History  Procedure Laterality Date  . Coronary artery bypass graft  1998     LIMA-LAD;VG-OM1-OM2-OM3;  VG-PDA/PLA  . Peripheral vascular intervention  1997    Bilateral SFA PTA    Family History  Problem Relation Age of Onset  . CAD Mother   . Diabetes Mother   . CAD Father   . Diabetes Father   . CAD Brother    Social History:  reports that he quit smoking about 17 years ago. He has never used smokeless tobacco. He reports that  drinks alcohol. He reports that he does not use illicit drugs.  Allergies: No Known Allergies  Medications Prior to Admission  Medication Sig Dispense Refill  . aspirin EC 81 MG tablet Take 81 mg by mouth at bedtime.      Marland Kitchen diltiazem (CARDIZEM) 120 MG tablet Take 60 mg by mouth 2 (two) times daily.      . enalapril (VASOTEC) 20 MG tablet Take 20 mg by mouth 2 (two) times daily.      . furosemide (LASIX) 40 MG tablet Take 40 mg by mouth daily with breakfast.      . glimepiride (AMARYL) 2 MG tablet Take 2 mg by mouth 2 (two) times daily before lunch and supper.      Marland Kitchen glucosamine-chondroitin 500-400 MG tablet Take 3 tablets by mouth daily.      . insulin glargine (LANTUS) 100 UNIT/ML injection Inject 8 Units into the skin  daily.      . metoprolol (LOPRESSOR) 50 MG tablet Take 50 mg by mouth 2 (two) times daily.      . pioglitazone (ACTOS) 45 MG tablet Take 45 mg by mouth daily.      . potassium chloride SA (K-DUR,KLOR-CON) 20 MEQ tablet Take 20 mEq by mouth daily with breakfast.      . simvastatin (ZOCOR) 40 MG tablet Take 40 mg by mouth every evening.      . warfarin (COUMADIN) 5 MG tablet Take 5-7.5 mg by mouth daily. Take 5 mg on Monday, Wednesday, and Friday.  Take 7.5 mg on Tuesday, Thursday, Saturday, and Sunday.       Coumadin  Results for orders placed during the hospital encounter of 05/19/12 (from the past 48 hour(s))  PRO B NATRIURETIC PEPTIDE     Status: Abnormal   Collection Time    05/19/12 12:20 PM      Result Value Range   Pro B Natriuretic peptide (BNP) 20098.0 (*) 0 - 125 pg/mL  BASIC METABOLIC PANEL     Status: Abnormal   Collection Time    05/19/12 12:20 PM      Result Value Range   Sodium 140  135 - 145 mEq/L   Potassium 7.0 (*) 3.5 - 5.1 mEq/L   Comment: NO VISIBLE HEMOLYSIS     CRITICAL RESULT CALLED TO, READ BACK BY AND VERIFIED WITH:     [facility]. AT 1337 ON [id number] BY LOVE,T.    Chloride 107  96 - 112 mEq/L   CO2 25  19 - 32 mEq/L   Glucose, Bld 106 (*) 70 - 99 mg/dL   BUN 70 (*) 6 - 23 mg/dL   Creatinine, Ser 9.14 (*) 0.50 - 1.35 mg/dL   Calcium 8.6  8.4 - 78.2 mg/dL   GFR calc non Af Amer 25 (*) >90 mL/min   GFR calc Af Amer 29 (*) >90 mL/min   Comment:            The eGFR has been calculated     using the CKD EPI equation.     This calculation has not  been     validated in all clinical     situations.     eGFR's persistently     <90 mL/min signify     possible Chronic Kidney Disease.  CBC     Status: Abnormal   Collection Time    05/19/12 12:20 PM      Result Value Range   WBC 6.4  4.0 - 10.5 K/uL   RBC 3.50 (*) 4.22 - 5.81 MIL/uL   Hemoglobin 10.9 (*) 13.0 - 17.0 g/dL   HCT 16.1 (*) 09.6 - 04.5 %   MCV 101.4 (*) 78.0 - 100.0 fL   MCH 31.1  26.0 - 34.0 pg    MCHC 30.7  30.0 - 36.0 g/dL   RDW 40.9 (*) 81.1 - 91.4 %   Platelets 139 (*) 150 - 400 K/uL  PROTIME-INR     Status: Abnormal   Collection Time    05/19/12 12:30 PM      Result Value Range   Prothrombin Time 31.9 (*) 11.6 - 15.2 seconds   INR 3.33 (*) 0.00 - 1.49  GLUCOSE, CAPILLARY     Status: None   Collection Time    05/19/12  3:12 PM      Result Value Range   Glucose-Capillary 75  70 - 99 mg/dL  POTASSIUM     Status: None   Collection Time    05/19/12  4:08 PM      Result Value Range   Potassium 5.0  3.5 - 5.1 mEq/L   Comment: DELTA CHECK NOTED     RESULTS VERIFIED VIA RECOLLECT  TROPONIN I     Status: None   Collection Time    05/19/12  6:12 PM      Result Value Range   Troponin I <0.30  <0.30 ng/mL   Comment:            Due to the release kinetics of cTnI,     a negative result within the first hours     of the onset of symptoms does not rule out     myocardial infarction with certainty.     If myocardial infarction is still suspected,     repeat the test at appropriate intervals.  BASIC METABOLIC PANEL     Status: Abnormal   Collection Time    05/19/12  6:12 PM      Result Value Range   Sodium 144  135 - 145 mEq/L   Potassium 5.6 (*) 3.5 - 5.1 mEq/L   Chloride 107  96 - 112 mEq/L   CO2 26  19 - 32 mEq/L   Glucose, Bld 59 (*) 70 - 99 mg/dL   BUN Maxwell (*) 6 - 23 mg/dL   Creatinine, Ser 7.82 (*) 0.50 - 1.35 mg/dL   Calcium 9.7  8.4 - 95.6 mg/dL   GFR calc non Af Amer 27 (*) >90 mL/min   GFR calc Af Amer 31 (*) >90 mL/min   Comment:            The eGFR has been calculated     using the CKD EPI equation.     This calculation has not been     validated in all clinical     situations.     eGFR's persistently     <90 mL/min signify     possible Chronic Kidney Disease.  CK TOTAL AND CKMB     Status: Abnormal   Collection Time    05/19/12  6:12 PM  Result Value Range   Total CK 142  7 - 232 U/L   CK, MB 4.0  0.3 - 4.0 ng/mL   Relative Index 2.8 (*) 0.0  - 2.5   Dg Chest Port 1 View  05/19/2012  *RADIOLOGY REPORT*  Clinical Data: CHF, shortness of breath and cough.  PORTABLE CHEST - 1 VIEW  Comparison: 06/28/2007.  Findings: Trachea is midline.  Heart size is accentuated by AP technique.  There may be tiny bilateral pleural effusions.  Lungs are otherwise clear.  IMPRESSION: Question tiny bilateral pleural effusions.   Original Report Authenticated By: Leanna Battles, M.D.     ROS: General:no colds or fevers in last month, viral illness in Jan.of this year, ++ weight gain especially last 2 weeks Skin:no rashes or ulcers HEENT:no blurred vision, no congestion CV:see HPI PUL:see HPI, no SOB exceptly mildly walking into the hospital today GI:+ diarrhea, no constipation or melena, no indigestion GU:no hematuria, no dysuria MS:no joint pain, no claudication Neuro:+ syncope with dizziness and explosive diarrhea Endo:+ diabetes on actos and insulin, no thyroid disease  PHYSICAL EXAM:   MD findings in bold italics. Blood pressure 129/67, pulse 102, temperature 97.8 F (36.6 C), temperature source Oral, resp. rate 16, height 5\' 10"  (1.778 m), weight 96.7 kg (213 lb 3 oz), SpO2 98.00%. General:alert and oriented, pleasant affect, NAD Skin:warm and dry, brisk capillary refill HEENT:normocephalic sclera clear, mucus membranes moist, glasses in place. Neck:supple, 1 cm JVD -- not well seen. Soft carotid bruits Heart:irreg irreg though controlled, soft, systolic murmur Lungs:fairly clear, no rales wheezes or rhonchi, though did receive albuterol treatment earlier. ZOX:WRUEA, soft, non tender, + BS. No masses palpated. Ascites, pitting edema of abdominal wall & sacral edema Ext:+ 1-2+ pitting edema more post. From ankle to mid back., weak pedal pulses Neuro:alert and oriented X 3, MAE, follows commands, stable gait    Assessment/Plan Principal Problem:   ARF (acute renal failure) Active Problems:   Acute on chronic combined systolic and diastolic  congestive heart failure, NYHA class 3   Hyperkalemia   Cardiorenal syndrome with renal failure - acute on chronic renal failure with worsening HF.   Atrial fibrillation with rapid ventricular response   Acute on chronic right-sided HF (heart failure)   Type 2 diabetes mellitus   Coronary artery disease, hx of CABG 1998, LIMA-LAD;VG-OM1-OM2-OM3; VG-PDA/PLA   Atrial fibrillation, permanent   Hypertension   Cardiomyopathy, EF 30-35% by echo 03/2012  PLAN: in ER rec'd calcium gluconate, D50, insulin, and kayexalate. Re'c lasix IV 40 mg today, voiding freq now.  NTG has been added.  Once kidney function improves would do lexiscan myoview here.  EF low and has been on enalapril and lopressor at home for some time.  If nuc negative we may need to proceed with ICD or BiV with hx of LBBB.    Coumadin on hold, once INR < 2.0 would begin Heparin or if no plan for procedure we may be able to resume coumadin in next few days.  Well check troponin and CKMB as well.    Nada Boozer, NP 05/19/2012, 4:52 PM  05/19/2012 7:40 PM

## 2012-05-19 NOTE — ED Provider Notes (Signed)
I saw and evaluated the patient, reviewed the resident's note and I agree with the findings and plan.   .Face to face Exam:  General:  Awake HEENT:  Atraumatic Resp:  Normal effort Abd:  Nondistended Neuro:No focal weakness    CRITICAL CARE Performed by: Nelva Nay L CRITICAL CARE Performed by: Nelva Nay L   Total critical care time: 30 min Critical care time was exclusive of separately billable procedures and treating other patients.  Critical care was necessary to treat or prevent imminent or life-threatening deterioration.  Critical care was time spent personally by me on the following activities: development of treatment plan with patient and/or surrogate as well as nursing, discussions with consultants, evaluation of patient's response to treatment, examination of patient, obtaining history from patient or surrogate, ordering and performing treatments and interventions, ordering and review of laboratory studies, ordering and review of radiographic studies, pulse oximetry and re-evaluation of patient's condition.        Nelia Shi, MD 05/19/12 [phone]

## 2012-05-19 NOTE — ED Notes (Signed)
Pt states he has sob.  Treated for chf for past 4 months.  Pt states he has been progressively gaining weight and medication has not worked.  Pt denies any acute deterioration in past few days

## 2012-05-19 NOTE — ED Notes (Signed)
X-ray at bedside

## 2012-05-20 ENCOUNTER — Other Ambulatory Visit: Payer: Self-pay

## 2012-05-20 ENCOUNTER — Inpatient Hospital Stay (HOSPITAL_COMMUNITY): Payer: BC Managed Care – PPO

## 2012-05-20 DIAGNOSIS — I5043 Acute on chronic combined systolic (congestive) and diastolic (congestive) heart failure: Secondary | ICD-10-CM

## 2012-05-20 DIAGNOSIS — I447 Left bundle-branch block, unspecified: Secondary | ICD-10-CM | POA: Diagnosis present

## 2012-05-20 DIAGNOSIS — E785 Hyperlipidemia, unspecified: Secondary | ICD-10-CM | POA: Diagnosis present

## 2012-05-20 DIAGNOSIS — I739 Peripheral vascular disease, unspecified: Secondary | ICD-10-CM | POA: Diagnosis present

## 2012-05-20 DIAGNOSIS — Z7901 Long term (current) use of anticoagulants: Secondary | ICD-10-CM

## 2012-05-20 LAB — CBC
HCT: 34.9 % — ABNORMAL LOW (ref 39.0–52.0)
Hemoglobin: 10.9 g/dL — ABNORMAL LOW (ref 13.0–17.0)
RBC: 3.51 MIL/uL — ABNORMAL LOW (ref 4.22–5.81)
WBC: 5.1 10*3/uL (ref 4.0–10.5)

## 2012-05-20 LAB — SODIUM, URINE, RANDOM: Sodium, Ur: 118 mEq/L

## 2012-05-20 LAB — BASIC METABOLIC PANEL
BUN: 63 mg/dL — ABNORMAL HIGH (ref 6–23)
Chloride: 107 mEq/L (ref 96–112)
GFR calc Af Amer: 33 mL/min — ABNORMAL LOW (ref >90)
Potassium: 4.3 mEq/L (ref 3.5–5.1)
Sodium: 144 mEq/L (ref 135–145)

## 2012-05-20 LAB — IRON AND TIBC
Iron: 46 ug/dL (ref 42–135)
Saturation Ratios: 14 % — ABNORMAL LOW (ref 20–55)
UIBC: 294 ug/dL (ref 125–400)

## 2012-05-20 LAB — TROPONIN I: Troponin I: <0.3 ng/mL (ref <0.30)

## 2012-05-20 LAB — PROTIME-INR: INR: 3.4 — ABNORMAL HIGH (ref 0.00–1.49)

## 2012-05-20 LAB — CK TOTAL AND CKMB (NOT AT ARMC): Relative Index: 2.7 — ABNORMAL HIGH (ref 0.0–2.5)

## 2012-05-20 LAB — GLUCOSE, CAPILLARY: Glucose-Capillary: 130 mg/dL — ABNORMAL HIGH (ref 70–99)

## 2012-05-20 LAB — FERRITIN: Ferritin: 50 ng/mL (ref 22–322)

## 2012-05-20 MED ORDER — FUROSEMIDE 80 MG PO TABS
80.0000 mg | ORAL_TABLET | Freq: Two times a day (BID) | ORAL | Status: DC
  Administered 2012-05-20 – 2012-05-21 (×2): 80 mg via ORAL
  Filled 2012-05-20 (×4): qty 1

## 2012-05-20 MED ORDER — LORAZEPAM 1 MG PO TABS
1.0000 mg | ORAL_TABLET | Freq: Every day | ORAL | Status: DC
  Administered 2012-05-20: 1 mg via ORAL
  Filled 2012-05-20: qty 1

## 2012-05-20 NOTE — Progress Notes (Addendum)
Pharmacy Anticoag Note (Brief)   Pharmacy Consult for: Coumadin and IV Heparin if INR < 2  Indication: Permanent atrial fibrillation  4/1 INR=3.4  INR remains >3. No heparin or warfarin today.  Plan: 1) No warfarin or heparin today 2) F/U INR tomorrow 3) Plan to resume warfarin dosing once INR is <3, unless "warfarin per pharmacy" consult is d/c'd. MD, please d/c consult if you do NOT want warfarin to be resumed once INR <3.  Darrol Angel, PharmD Pager: [phone] 05/20/2012 7:24 AM

## 2012-05-20 NOTE — Progress Notes (Signed)
Hypoglycemic Event    CBG: 37, called in from lab technician [phone]  Treatment: 15 GM carbohydrate snack  Symptoms: None  Follow-up CBG: Time:0700 CBG Result:68  Treatment: 15 GM carbohydrate snack  Follow-up CBG: Time 0715  CBG Result: 86  Possible Reasons for Event: Unknown  Comments/MD notified:Rolanda Jay  Remember to initiate Hypoglycemia Order Set & complete

## 2012-05-20 NOTE — Consult Note (Signed)
Maxwell Fitzgerald is an 62 y.o. male referred by Dr Maxwell Fitzgerald   Chief Complaint: CKD 3/4, hyperkalemia HPI: [name] with hx of CKD3 sec to DM/HTN who presented to ER yest with SOB and Weight gain.  Scr in 1/14 was 1.7 and on admission was 2.5 with K of 7.  K now at 4.3 and Scr 2.3.  Was on ACE inhibitor and recently placed on K supplement by his cardiologist.  Renal US unremarkable.  UA shows no protein though he had a pr/Cr of 2021 in 10/13.    Past Medical History  Diagnosis Date  . CHF (congestive heart failure)   . Renal disorder   . Type 2 diabetes mellitus   . COPD (chronic obstructive pulmonary disease)   . Coronary artery disease     s/p CABG 1998; Myoview 2008 mild septal ischemia -no cath,  . Hypertension   . Atrial fibrillation, permanent     on warfarin; recurred after DCCV  . PAD (peripheral artery disease) 1997    Bilateral SFA PTA in 1997  . Chronic anticoagulation     on coumadin  . LBBB (left bundle branch block)     chronic  . Cardiomyopathy, EF 30-35% by echo 03/2012 05/19/2012    Decreased from 2009 Echo, 40-45%  . CKD (chronic kidney disease) stage 3, GFR 30-59 ml/min     Baseline BUN/Cr 09/2011 - 36/1.44    Past Surgical History  Procedure Laterality Date  . Coronary artery bypass graft  1998     LIMA-LAD;VG-OM1-OM2-OM3; VG-PDA/PLA  . Peripheral vascular intervention  1997    Bilateral SFA PTA    Family History  Problem Relation Age of Onset  . CAD Mother   . Diabetes Mother   . CAD Father   . Diabetes Father   . CAD Brother    Social History:  reports that he quit smoking about 17 years ago. He has never used smokeless tobacco. He reports that  drinks alcohol. He reports that he does not use illicit drugs.  Allergies: No Known Allergies  Medications Prior to Admission  Medication Sig Dispense Refill  . aspirin EC 81 MG tablet Take 81 mg by mouth at bedtime.      Marland Kitchen diltiazem (CARDIZEM) 120 MG tablet Take 60 mg by mouth 2 (two) times daily.      .  enalapril (VASOTEC) 20 MG tablet Take 20 mg by mouth 2 (two) times daily.      . furosemide (LASIX) 40 MG tablet Take 40 mg by mouth daily with breakfast.      . glimepiride (AMARYL) 2 MG tablet Take 2 mg by mouth 2 (two) times daily before lunch and supper.      Marland Kitchen glucosamine-chondroitin 500-400 MG tablet Take 3 tablets by mouth daily.      . insulin glargine (LANTUS) 100 UNIT/ML injection Inject 8 Units into the skin daily.      . metoprolol (LOPRESSOR) 50 MG tablet Take 50 mg by mouth 2 (two) times daily.      . pioglitazone (ACTOS) 45 MG tablet Take 45 mg by mouth daily.      . potassium chloride SA (K-DUR,KLOR-CON) 20 MEQ tablet Take 20 mEq by mouth daily with breakfast.      . simvastatin (ZOCOR) 40 MG tablet Take 40 mg by mouth every evening.      . warfarin (COUMADIN) 5 MG tablet Take 5-7.5 mg by mouth daily. Take 5 mg on Monday, Wednesday, and Friday.  Take  7.5 mg on Tuesday, Thursday, Saturday, and Sunday.         Lab Results: UA: No protein  Micro neg   Recent Labs  05/19/12 1220 05/20/12 0535  WBC 6.4 5.1  HGB 10.9* 10.9*  HCT 35.5* 34.9*  PLT 139* 139*   BMET  Recent Labs  05/19/12 1220 05/19/12 1608 05/19/12 1812 05/20/12 0535  NA 140  --  144 144  K 7.0* 5.0 5.6* 4.3  CL 107  --  107 107  CO2 25  --  26 29  GLUCOSE 106*  --  59* 37*  BUN 70*  --  68* 63*  CREATININE 2.58*  --  2.43* 2.32*  CALCIUM 8.6  --  9.7 8.9   LFT  Recent Labs  05/19/12 1812  PROT 7.1  ALBUMIN 3.6  AST 28  ALT 17  ALKPHOS 71  BILITOT 0.3  BILIDIR 0.1  IBILI 0.2*   US Renal  05/20/2012  *RADIOLOGY REPORT*  Clinical Data: Acute renal failure  RENAL/URINARY TRACT ULTRASOUND COMPLETE  Comparison:  None.  Findings:  Right Kidney:  12.5 cm.  Mild renal parenchymal thinning. No hydronephrosis or renal mass.  Left Kidney:  11.6 cm.  Mild renal parenchymal thinning. No hydronephrosis or renal mass.  Bladder:  Post void.  Ascites.  IMPRESSION: No hydronephrosis.  Renal parenchymal  thinning bilaterally.  Ascites.  This is a call report.   Original Report Authenticated By: Lacy Duverney, M.D.    Dg Chest Port 1 View  05/19/2012  *RADIOLOGY REPORT*  Clinical Data: CHF, shortness of breath and cough.  PORTABLE CHEST - 1 VIEW  Comparison: 06/28/2007.  Findings: Trachea is midline.  Heart size is accentuated by AP technique.  There may be tiny bilateral pleural effusions.  Lungs are otherwise clear.  IMPRESSION: Question tiny bilateral pleural effusions.   Original Report Authenticated By: Leanna Battles, M.D.     ROS: No change in vision No CP + DOE No abd pain,  Diarrhea from kayexalate No claudication No neuropathic sxs No new arthritic CO  PHYSICAL EXAM: Blood pressure 109/63, pulse 88, temperature 98.1 F (36.7 C), temperature source Oral, resp. rate 18, height 5\' 10"  (1.778 m), weight 96.6 kg (212 lb 15.4 oz), SpO2 96.00%. HEENT: PERRLA EOMI NECK:mild JVD LUNGS:scattered exp wheezes CARDIAC:Irreg irreg, no rub or gallop ABD:+ BS NTND no HSM EXT:1+ edema NEURO:CNI  M&SI  No asterixis  Assessment: 1. Acute on CKD3 vs progression of underlying CKD 2. Hyperkalemia, improved 3. Volume overload 4. Chronic A. Fib 5. DM 6. Cardiomyopathy, most likely ischemic PLAN: 1. DC KCL 2.  hold enalapril for now 3. Could switch his lasix to PO 4. I think he will need ht cath.  I discussed risks of ht cath with him.  30-40% chance of Scr worsening but then improving without sequelae.  <10% chance of requiring HD and even if he did, I would anticipate it would only be temporary 5. Daily Scr 6. 6. Check pth 7. Check iron studies   Maxwell Fitzgerald 05/20/2012, 9:50 AM

## 2012-05-20 NOTE — Care Management Note (Signed)
    Page 1 of 1   05/20/2012     3:14:26 PM   CARE MANAGEMENT NOTE 05/20/2012  Patient:  Maxwell Fitzgerald, Maxwell Fitzgerald   Account Number:  [id number]  Date Initiated:  05/20/2012  Documentation initiated by:  Lanier Clam  Subjective/Objective Assessment:   ADMITTED W/SOB.CHF,CKD.     Action/Plan:   FROM HOME.HAS PCP,PHARMACY.   Anticipated DC Date:  05/23/2012   Anticipated DC Plan:  HOME/SELF CARE      DC Planning Services  CM consult      Choice offered to / List presented to:             Status of service:  In process, will continue to follow Medicare Important Message given?   (If response is "NO", the following Medicare IM given date fields will be blank) Date Medicare IM given:   Date Additional Medicare IM given:    Discharge Disposition:    Per UR Regulation:  Reviewed for med. necessity/level of care/duration of stay  If discussed at Long Length of Stay Meetings, dates discussed:    Comments:  05/20/12 Joni Norrod RN,BSN NCM [phone] CARDIO/RENAL FOLLOWING.

## 2012-05-20 NOTE — Progress Notes (Signed)
Subjective:  Better, less SOB. Diarrhea improved.  Objective:  Vital Signs in the last 24 hours: Temp:  [97.8 F (36.6 C)-98.1 F (36.7 C)] 98.1 F (36.7 C) (04/01 0606) Pulse Rate:  [68-102] 88 (04/01 0606) Resp:  [12-21] 18 (04/01 0606) BP: (109-149)/(63-82) 109/63 mmHg (04/01 0606) SpO2:  [94 %-100 %] 96 % (04/01 0606) Weight:  [96.6 kg (212 lb 15.4 oz)-96.7 kg (213 lb 3 oz)] 96.6 kg (212 lb 15.4 oz) (04/01 0606)  Intake/Output from previous day:  Intake/Output Summary (Last 24 hours) at 05/20/12 0830 Last data filed at 05/20/12 0500  Gross per 24 hour  Intake    240 ml  Output   1450 ml  Net  -1210 ml    Physical Exam: General appearance: alert, cooperative, no distress and moderately obese Lungs: clear to auscultation bilaterally Heart: irregularly irregular rhythm Abdomen: obese Extremities: !+ bilat edema   Rate: 70-110  Rhythm: atrial fibrillation and LBBB, PVCs  Lab Results:  Recent Labs  05/19/12 1220 05/20/12 0535  WBC 6.4 5.1  HGB 10.9* 10.9*  PLT 139* 139*    Recent Labs  05/19/12 1812 05/20/12 0535  NA 144 144  K 5.6* 4.3  CL 107 107  CO2 26 29  GLUCOSE 59* 37*  BUN 68* 63*  CREATININE 2.43* 2.32*    Recent Labs  05/19/12 2336 05/20/12 0535  TROPONINI <0.30 <0.30   Hepatic Function Panel  Recent Labs  05/19/12 1812  PROT 7.1  ALBUMIN 3.6  AST 28  ALT 17  ALKPHOS 71  BILITOT 0.3  BILIDIR 0.1  IBILI 0.2*   No results found for this basename: CHOL,  in the last 72 hours  Recent Labs  05/20/12 0535  INR 3.40*    Imaging: Imaging results have been reviewed  Cardiac Studies:  Assessment/Plan:   Principal Problem:   Acute on chronic combined systolic and diastolic congestive heart failure, NYHA class 3  Active Problems:   ARF (acute renal failure)   Cardiorenal syndrome with renal failure - acute on chronic renal failure with worsening HF.   Acute on chronic right-sided HF (heart failure)   Type 2 diabetes  mellitus   Coronary artery disease, hx of CABG 1998, LIMA-LAD;VG-OM1-OM2-OM3; VG-PDA/PLA   Atrial fibrillation, permanent   Hypertension   Cardiomyopathy, EF 30-35% by echo 03/2012   CKD (chronic kidney disease) stage 3, GFR 30-59 ml/min   Atrial fibrillation with rapid ventricular response on admission   Chronic anticoagulation, INR elevated on admission   Hyperkalemia   LBBB (left bundle branch block)   PAD (peripheral artery disease)   Dyslipidemia   Plan- Continue to diurese. He says his wgt at baseline is 190, he is 212lbs today. Echo recently done in the office, and he has had renal artery dopplers in the past, I will get these results to the chart. MD to see later today.   Corine Shelter PA-C 05/20/2012, 8:30 AM  Agree with note written by Corine Shelter PAC  Pt with ISCM, mod LV dysfunction, CAF on coumadin A/C. Admitted with volume overload/CHF. Electrolyte abnormalities`s resolved. SCr slightly improved but still significantly above baseline. Getting diuresed. Dr. Briant Cedar following as well. Meds adjusted including BB for rate control. Would continue current Rx. Follow renal function, weight, BNP etc... I would not cath at this point without objective evidence of ischemia (i.e + myoview) in light of his renal insufficiency and possible RCN. Agree that he may ultimately be a candidate for CRT. Will follow with you. Continue  coumadin AC for now.   Runell Gess 05/20/2012 4:46 PM

## 2012-05-20 NOTE — Progress Notes (Signed)
[facility] PROGRESS NOTE  Maxwell Fitzgerald WUJ:[id number] DOB: Jul 28, 1950 DOA: 05/19/2012 PCP: Lolita Patella, MD  Assessment/Plan:  #1 acute on chronic CHF exacerbation Questionable etiology. Patient on admission stated he gained 28 pounds. Cardiac enzymes negative x3. I/O. equal -1210. Clinical improvement. Continue diuretics with Lasix. Continue aspirin, Lipitor , diltiazem, bidil, Lopressor. Cardiology following and appreciate input and recommendations. Will discontinue Amaryl.  #2 acute on chronic kidney disease stage III versus progression of underlying chronic kidney disease Likely secondary to problem #1. KCl has been discontinued. ACE inhibitor on hold. Lasix have been changed to oral per nephrology. PT is pending. Renal is following and appreciate input and recommendations.  #3 hyperkalemia Resolved. Diet has been changed to a renal diet. Follow.  #4 atrial fibrillation Continue Lopressor and diltiazem for rate control. Coumadin for anticoagulations. Coumadin on hold secondary to supratherapeutic INR. Once INR is less than 3 will resume Coumadin. Per cardiology patient will likely not undergo cardiac catheterization during this hospitalization and okay to resume Coumadin.  #5 hypertension Stable. Continue Lasix, Lopressor, diltiazem.  #6 history of coronary artery disease Stable. See problem #1. Per cardiology. Continue current regimen.  #7 type 2 diabetes Hemoglobin A1c 7.1. CBGs are range from 37-75. Will discontinue Amaryl and Lantus. Continue sliding scale insulin.  #8 diarrhea Resolved after stopping potassium supplementation. Follow.  #9 prophylaxis Coumadin for DVT prophylaxis.   Code Status: Full Family Communication: The patient no family at bedside Disposition Plan: Home when medically stable   Consultants:  Nephrology: Dr. Briant Cedar 05/20/2012  Cardiology: Dr. Herbie Baltimore 05/19/2012  Procedures:  CXR 05/19/12  Renal US  05/20/12  Antibiotics:  None  HPI/Subjective: Patient states he is feeling better. Patient states diarrhea has resolved.  Objective: Filed Vitals:   05/19/12 1719 05/19/12 1922 05/19/12 2135 05/20/12 0606  BP: 129/67  149/76 109/63  Pulse: 102  68 88  Temp: 97.8 F (36.6 C)  98.1 F (36.7 C) 98.1 F (36.7 C)  TempSrc: Oral  Oral Oral  Resp: 16  18 18   Height:  5\' 10"  (1.778 m)    Weight:  96.7 kg (213 lb 3 oz)  96.6 kg (212 lb 15.4 oz)  SpO2: 98%  98% 96%    Intake/Output Summary (Last 24 hours) at 05/20/12 1254 Last data filed at 05/20/12 0500  Gross per 24 hour  Intake    240 ml  Output   1450 ml  Net  -1210 ml   Filed Weights   05/19/12 1922 05/20/12 0606  Weight: 96.7 kg (213 lb 3 oz) 96.6 kg (212 lb 15.4 oz)    Exam:   General:  NAD  Cardiovascular: IRREGULARLY IRREG jvd  Respiratory: ctab  Abdomen: OBESE, SOFT/nt/nd/+bs  Extremities: No c/c. 1 + BLE edema  Data Reviewed: Basic Metabolic Panel:  Recent Labs Lab 05/19/12 1220 05/19/12 1608 05/19/12 1812 05/20/12 0535  NA 140  --  144 144  K 7.0* 5.0 5.6* 4.3  CL 107  --  107 107  CO2 25  --  26 29  GLUCOSE 106*  --  59* 37*  BUN 70*  --  68* 63*  CREATININE 2.58*  --  2.43* 2.32*  CALCIUM 8.6  --  9.7 8.9   Liver Function Tests:  Recent Labs Lab 05/19/12 1812  AST 28  ALT 17  ALKPHOS 71  BILITOT 0.3  PROT 7.1  ALBUMIN 3.6   No results found for this basename: LIPASE, AMYLASE,  in the last 168 hours No results found  for this basename: AMMONIA,  in the last 168 hours CBC:  Recent Labs Lab 05/19/12 1220 05/20/12 0535  WBC 6.4 5.1  HGB 10.9* 10.9*  HCT 35.5* 34.9*  MCV 101.4* 99.4  PLT 139* 139*   Cardiac Enzymes:  Recent Labs Lab 05/19/12 1812 05/19/12 2336 05/20/12 0535  CKTOTAL 142 118  --   CKMB 4.0 3.2  --   TROPONINI <0.30 <0.30 <0.30   BNP (last 3 results)  Recent Labs  05/19/12 1220  PROBNP 20098.0*   CBG:  Recent Labs Lab 05/19/12 1512  GLUCAP  75    No results found for this or any previous visit (from the past 240 hour(s)).   Studies: US Renal  05/20/2012  *RADIOLOGY REPORT*  Clinical Data: Acute renal failure  RENAL/URINARY TRACT ULTRASOUND COMPLETE  Comparison:  None.  Findings:  Right Kidney:  12.5 cm.  Mild renal parenchymal thinning. No hydronephrosis or renal mass.  Left Kidney:  11.6 cm.  Mild renal parenchymal thinning. No hydronephrosis or renal mass.  Bladder:  Post void.  Ascites.  IMPRESSION: No hydronephrosis.  Renal parenchymal thinning bilaterally.  Ascites.  This is a call report.   Original Report Authenticated By: Lacy Duverney, M.D.    Dg Chest Port 1 View  05/19/2012  *RADIOLOGY REPORT*  Clinical Data: CHF, shortness of breath and cough.  PORTABLE CHEST - 1 VIEW  Comparison: 06/28/2007.  Findings: Trachea is midline.  Heart size is accentuated by AP technique.  There may be tiny bilateral pleural effusions.  Lungs are otherwise clear.  IMPRESSION: Question tiny bilateral pleural effusions.   Original Report Authenticated By: Leanna Battles, M.D.     Scheduled Meds: . aspirin EC  81 mg Oral QHS  . atorvastatin  20 mg Oral q1800  . diltiazem  60 mg Oral Q12H  . furosemide  80 mg Oral BID  . glimepiride  1 mg Oral BID AC  . insulin aspart  0-9 Units Subcutaneous TID WC  . isosorbide-hydrALAZINE  1 tablet Oral TID  . metoprolol  37.5 mg Oral TID  . nitroGLYCERIN  0.5 inch Topical Q6H  . sodium chloride  3 mL Intravenous Q12H   Continuous Infusions:   Principal Problem:   Acute on chronic combined systolic and diastolic congestive heart failure, NYHA class 3 Active Problems:   Type 2 diabetes mellitus   Coronary artery disease, hx of CABG 1998, LIMA-LAD;VG-OM1-OM2-OM3; VG-PDA/PLA   Atrial fibrillation, permanent   Hypertension   ARF (acute renal failure)   Hyperkalemia   Cardiomyopathy, EF 30-35% by echo 03/2012   CKD (chronic kidney disease) stage 3, GFR 30-59 ml/min   Cardiorenal syndrome with renal  failure - acute on chronic renal failure with worsening HF.   Atrial fibrillation with rapid ventricular response   Acute on chronic right-sided HF (heart failure)   LBBB (left bundle branch block)   PAD (peripheral artery disease)   Chronic anticoagulation   Dyslipidemia    Time spent: > 35 mins    [facility]  [facility] Pager [phone]. If 7PM-7AM, please contact night-coverage at [email or web address], password [facility] 05/20/2012, 12:54 PM  LOS: 1 day

## 2012-05-21 ENCOUNTER — Other Ambulatory Visit (HOSPITAL_COMMUNITY): Payer: Self-pay | Admitting: Cardiology

## 2012-05-21 DIAGNOSIS — I255 Ischemic cardiomyopathy: Secondary | ICD-10-CM

## 2012-05-21 DIAGNOSIS — N179 Acute kidney failure, unspecified: Secondary | ICD-10-CM | POA: Diagnosis not present

## 2012-05-21 DIAGNOSIS — N189 Chronic kidney disease, unspecified: Secondary | ICD-10-CM

## 2012-05-21 DIAGNOSIS — E119 Type 2 diabetes mellitus without complications: Secondary | ICD-10-CM

## 2012-05-21 LAB — CBC
HCT: 33.8 % — ABNORMAL LOW (ref 39.0–52.0)
Hemoglobin: 10.6 g/dL — ABNORMAL LOW (ref 13.0–17.0)
MCH: 31 pg (ref 26.0–34.0)
MCHC: 31.4 g/dL (ref 30.0–36.0)
MCV: 98.8 fL (ref 78.0–100.0)
RBC: 3.42 MIL/uL — ABNORMAL LOW (ref 4.22–5.81)

## 2012-05-21 LAB — RENAL FUNCTION PANEL
BUN: 61 mg/dL — ABNORMAL HIGH (ref 6–23)
CO2: 33 mEq/L — ABNORMAL HIGH (ref 19–32)
Calcium: 9 mg/dL (ref 8.4–10.5)
Chloride: 104 mEq/L (ref 96–112)
Creatinine, Ser: 2.09 mg/dL — ABNORMAL HIGH (ref 0.50–1.35)
GFR calc non Af Amer: 32 mL/min — ABNORMAL LOW (ref >90)
Glucose, Bld: 94 mg/dL (ref 70–99)

## 2012-05-21 LAB — PROTIME-INR: INR: 2.38 — ABNORMAL HIGH (ref 0.00–1.49)

## 2012-05-21 LAB — GLUCOSE, CAPILLARY
Glucose-Capillary: 68 mg/dL — ABNORMAL LOW (ref 70–99)
Glucose-Capillary: 123 mg/dL — ABNORMAL HIGH (ref 70–99)
Glucose-Capillary: 142 mg/dL — ABNORMAL HIGH (ref 70–99)
Glucose-Capillary: 44 mg/dL — CL (ref 70–99)
Glucose-Capillary: 191 mg/dL — ABNORMAL HIGH (ref 70–99)

## 2012-05-21 MED ORDER — ISOSORB DINITRATE-HYDRALAZINE 20-37.5 MG PO TABS: 1.0000 | ORAL_TABLET | Freq: Three times a day (TID) | ORAL | Status: DC

## 2012-05-21 MED ORDER — ATORVASTATIN CALCIUM 20 MG PO TABS: 20.0000 mg | ORAL_TABLET | Freq: Every day | ORAL | Status: AC

## 2012-05-21 MED ORDER — SODIUM CHLORIDE 0.9 % IV SOLN
1020.0000 mg | Freq: Once | INTRAVENOUS | Status: AC
  Administered 2012-05-21: 1020 mg via INTRAVENOUS
  Filled 2012-05-21: qty 34

## 2012-05-21 MED ORDER — FUROSEMIDE 80 MG PO TABS: 80.0000 mg | ORAL_TABLET | Freq: Two times a day (BID) | ORAL | Status: DC

## 2012-05-21 MED ORDER — WARFARIN SODIUM 5 MG PO TABS: 5.0000 mg | ORAL_TABLET | Freq: Every day | ORAL | Status: DC

## 2012-05-21 MED ORDER — PNEUMOCOCCAL VAC POLYVALENT 25 MCG/0.5ML IJ INJ
0.5000 mL | INJECTION | Freq: Once | INTRAMUSCULAR | Status: AC
  Administered 2012-05-21: 0.5 mL via INTRAMUSCULAR
  Filled 2012-05-21: qty 0.5

## 2012-05-21 MED ORDER — METOPROLOL TARTRATE 25 MG PO TABS: 37.5000 mg | ORAL_TABLET | Freq: Three times a day (TID) | ORAL | Status: DC

## 2012-05-21 MED ORDER — PNEUMOCOCCAL VAC POLYVALENT 25 MCG/0.5ML IJ INJ: 0.5000 mL | INJECTION | INTRAMUSCULAR | Status: DC

## 2012-05-21 NOTE — Discharge Summary (Signed)
Physician Discharge Summary  Maxwell Fitzgerald [id number]:[id number] DOB: 1950/12/29 DOA: 05/19/2012  PCP: Lolita Patella, MD  Admit date: 05/19/2012 Discharge date: 05/21/2012  Time spent: Greater than 30 minutes  Recommendations for Outpatient Follow-up:  1. With Dr. Susa Griffins, Cardiologist. MD's office will call with appt. 2. With [facility] on 05/23/12 for labs (PT, INR & BMP) and Coumadin dose adjustment. 3. With Dr. Primitivo Gauze, Nephrology. MD's office will call for appointment to be seen in 2-3 weeks. 4. With Dr. Elias Else, PCP  Discharge Diagnoses:  Principal Problem:   Acute on chronic combined systolic and diastolic congestive heart failure, NYHA class 3 Active Problems:   Type 2 diabetes mellitus   Coronary artery disease, hx of CABG 1998, LIMA-LAD;VG-OM1-OM2-OM3; VG-PDA/PLA   Atrial fibrillation, permanent   Hypertension   ARF (acute renal failure)   Hyperkalemia   Cardiomyopathy, EF 30-35% by echo 03/2012   CKD (chronic kidney disease) stage 3, GFR 30-59 ml/min   Cardiorenal syndrome with renal failure - acute on chronic renal failure with worsening HF.   Atrial fibrillation with rapid ventricular response   Acute on chronic right-sided HF (heart failure)   LBBB (left bundle branch block)   PAD (peripheral artery disease)   Chronic anticoagulation   Dyslipidemia   Discharge Condition: Improved & Stable  Diet recommendation: Heart Healthy & diabetic diet.  Filed Weights   05/19/12 1922 05/20/12 0606 05/21/12 0442  Weight: 96.7 kg (213 lb 3 oz) 96.6 kg (212 lb 15.4 oz) 93.8 kg (206 lb 12.7 oz)    History of present illness:  Maxwell Fitzgerald is a 62 y.o. male, with history of CAD status post CABG, DM 2, HTN, ischemic cardiomyopathy, chronic kidney disease stage III (creatinine 1.7 on 03/04/12), type 2 diabetes mellitus insulin-dependent, CHF chronic combined, last EF 35% one month, atrial fibrillation on Coumadin, who is admitted with 28 pound weight gain  over 6 weeks PTA with associated dyspnea, orthopnea but without chest pain or cough. He had profuse diarrhea since he was started on potassium supplementation 5 weeks ago, he tolerated oral Lasix well for 2 days and thereafter once potassium was added he developed profuse diarrhea.   In the ER he had evidence of volume overload, acute on chronic renal failure, severe hyperkalemia, case was discussed with Dr. Briant Cedar patient's nephrologist and also the nephrologist on call by the ER physician who requested hospitalist admission.   Hospital Course:  1. Acute on chronic combined CHF/ischemic cardiomyopathy: Admitted to hospital. Cardiology consulted. Diuresed with IV Lasix. Due to higher than usual baseline creatinine, nephrology was consulted and assisted with diuretic management. Clinically improved. Continue Lasix and Bidil. Cardiology and nephrology cleared patient for discharge with close outpatient followup by both teams. 2. Acute on stage III chronic kidney disease versus progression of underlying chronic kidney disease: Nephrology consulted. Potassium supplements were discontinued. Enalapril has been held at this time. Lasix was changed to by mouth. Creatinine continued to improve. Nephrology will follow patient closely as outpatient with repeat labs. 3. Hyperkalemia: Secondary to potassium supplements in the context of acute on chronic kidney disease. Resolved after IV Lasix diuresis. 4. Chronic A. fib on Coumadin: Controlled ventricular rate. Coumadin had been held on admission for possible cath. This will be resumed on discharge. Cardiology service will arrange for outpatient appointment for Coumadin testing and followup. 5. Type II DM: Oral hypoglycemics held due to potential for fluid retention. Continue insulins. Hemoglobin A1c: 7.1 6. CAD status post CABG/ischemic cardiomyopathy: Cardiology consulted. Patient wished  to have Myoview as outpatient versus inpatient. Cardiology cleared him for  discharge home. Continue aspirin and Lopressor. 7. Dyslipidemia: Status would change to Lipitor secondary to potential interaction with diltiazem. 8. Hypertension: Reasonably controlled. 9. Diarrhea: Resolved 10. Anemia and mild thrombocytopenia:? Iron deficiency. Stable. Outpatient followup.  Procedures:  None   Consultations:  Nephrology  Cardiology  Discharge Exam:  Complaints: On day of discharge, patient indicated that he was feeling much better with significant reduction in body weight/swelling, no dyspnea or chest pain.  Filed Vitals:   05/21/12 0059 05/21/12 0442 05/21/12 0954 05/21/12 1426  BP: 115/62 111/62 143/79 155/63  Pulse: 90 86 95 91  Temp:  98.4 F (36.9 C) 97.9 F (36.6 C) 98.8 F (37.1 C)  TempSrc:  Oral Oral Oral  Resp: 17 18 20 18   Height:      Weight:  93.8 kg (206 lb 12.7 oz)    SpO2:  98% 97% 96%     General exam: Comfortable.  Respiratory system: Clear. No increased work of breathing.  Cardiovascular system: S1 and S2 heard, RRR. No JVD, murmurs. Trace bilateral leg edema. Telemetry shows A. fib with controlled ventricular rate and occasional PVCs.  Gastrointestinal system: Abdomen is nondistended, soft and nontender. Normal bowel sounds heard.  Central nervous system: Alert and oriented. No focal neurological deficits.  Extremities: Symmetric 5 x 5 power.  Discharge Instructions      Discharge Orders   Future Appointments Provider Department Dept Phone   05/28/2012 10:15 AM [facility] [facility] [address] [phone]   Future Orders Complete By Expires     (HEART FAILURE PATIENTS) Call MD:  Anytime you have any of the following symptoms: 1) 3 pound weight gain in 24 hours or 5 pounds in 1 week 2) shortness of breath, with or without a dry hacking cough 3) swelling in the hands, feet or stomach 4) if you have to sleep on extra pillows at night in order to breathe.  As directed     Call MD for:   difficulty breathing, headache or visual disturbances  As directed     Diet - low sodium heart healthy  As directed     Diet Carb Modified  As directed     Increase activity slowly  As directed         Medication List    STOP taking these medications       enalapril 20 MG tablet  Commonly known as:  VASOTEC     glimepiride 2 MG tablet  Commonly known as:  AMARYL     pioglitazone 45 MG tablet  Commonly known as:  ACTOS     potassium chloride SA 20 MEQ tablet  Commonly known as:  K-DUR,KLOR-CON     simvastatin 40 MG tablet  Commonly known as:  ZOCOR      TAKE these medications       aspirin EC 81 MG tablet  Take 81 mg by mouth at bedtime.     atorvastatin 20 MG tablet  Commonly known as:  LIPITOR  Take 1 tablet (20 mg total) by mouth daily at 6 PM.     diltiazem 120 MG tablet  Commonly known as:  CARDIZEM  Take 60 mg by mouth 2 (two) times daily.     furosemide 80 MG tablet  Commonly known as:  LASIX  Take 1 tablet (80 mg total) by mouth 2 (two) times daily.     glucosamine-chondroitin 500-400 MG tablet  Take 3 tablets by mouth daily.     insulin glargine 100 UNIT/ML injection  Commonly known as:  LANTUS  Inject 8 Units into the skin daily.     isosorbide-hydrALAZINE 20-37.5 MG per tablet  Commonly known as:  BIDIL  Take 1 tablet by mouth 3 (three) times daily.     metoprolol tartrate 25 MG tablet  Commonly known as:  LOPRESSOR  Take 1.5 tablets (37.5 mg total) by mouth 3 (three) times daily.     warfarin 5 MG tablet  Commonly known as:  COUMADIN  Take 1 tablet (5 mg total) by mouth daily.       Follow-up Information   Follow up with Governor Rooks, MD. (office will call)    Contact information:   [address] [place] [address] [phone]       Follow up with [facility] at [facility] On 05/23/2012. (To be seen for blood tests (PT, INR & BMP) & Coumadin management.)       Follow up with  Dyke Maes, MD. (MDs office will call for appointment to be seen in 2-3 weeks.)    Contact information:   [address] [place] [address] [phone]       Schedule an appointment as soon as possible for a visit with Lolita Patella, MD.   Contact information:   [facility] [address] [place] [address] [phone]        The results of significant diagnostics from this hospitalization (including imaging, microbiology, ancillary and laboratory) are listed below for reference.    Significant Diagnostic Studies: US Renal  05/20/2012  *RADIOLOGY REPORT*  Clinical Data: Acute renal failure  RENAL/URINARY TRACT ULTRASOUND COMPLETE  Comparison:  None.  Findings:  Right Kidney:  12.5 cm.  Mild renal parenchymal thinning. No hydronephrosis or renal mass.  Left Kidney:  11.6 cm.  Mild renal parenchymal thinning. No hydronephrosis or renal mass.  Bladder:  Post void.  Ascites.  IMPRESSION: No hydronephrosis.  Renal parenchymal thinning bilaterally.  Ascites.  This is a call report.   Original Report Authenticated By: Lacy Duverney, M.D.    Dg Chest Port 1 View  05/19/2012  *RADIOLOGY REPORT*  Clinical Data: CHF, shortness of breath and cough.  PORTABLE CHEST - 1 VIEW  Comparison: 06/28/2007.  Findings: Trachea is midline.  Heart size is accentuated by AP technique.  There may be tiny bilateral pleural effusions.  Lungs are otherwise clear.  IMPRESSION: Question tiny bilateral pleural effusions.   Original Report Authenticated By: Leanna Battles, M.D.     Microbiology: No results found for this or any previous visit (from the past 240 hour(s)).   Labs: Basic Metabolic Panel:  Recent Labs Lab 05/19/12 1220 05/19/12 1608 05/19/12 1812 05/20/12 0535 05/21/12 0430  NA 140  --  144 144 141  K 7.0* 5.0 5.6* 4.3 4.5  CL 107  --  107 107 104  CO2 25  --  26 29 33*  GLUCOSE 106*  --  59* 37* 94  BUN 70*  --  68* 63* 61*  CREATININE  2.58*  --  2.43* 2.32* 2.09*  CALCIUM 8.6  --  9.7 8.9 9.0  PHOS  --   --   --   --  3.5   Liver Function Tests:  Recent Labs Lab 05/19/12 1812 05/21/12 0430  AST 28  --   ALT 17  --   ALKPHOS 71  --  BILITOT 0.3  --   PROT 7.1  --   ALBUMIN 3.6 3.1*   No results found for this basename: LIPASE, AMYLASE,  in the last 168 hours No results found for this basename: AMMONIA,  in the last 168 hours CBC:  Recent Labs Lab 05/19/12 1220 05/20/12 0535 05/21/12 0430  WBC 6.4 5.1 5.6  HGB 10.9* 10.9* 10.6*  HCT 35.5* 34.9* 33.8*  MCV 101.4* 99.4 98.8  PLT 139* 139* 133*   Cardiac Enzymes:  Recent Labs Lab 05/19/12 1812 05/19/12 2336 05/20/12 0535  CKTOTAL 142 118  --   CKMB 4.0 3.2  --   TROPONINI <0.30 <0.30 <0.30   BNP: BNP (last 3 results)  Recent Labs  05/19/12 1220  PROBNP 20098.0*   CBG:  Recent Labs Lab 05/20/12 1120 05/20/12 1612 05/20/12 2228 05/21/12 0734 05/21/12 1203  GLUCAP 191* 142* 130* 84 197*    Additional labs:    Signed:  Emaly Boschert  [facility] 05/21/2012, 4:49 PM

## 2012-05-21 NOTE — Progress Notes (Signed)
ANTICOAGULATION CONSULT NOTE - Follow Up  Pharmacy Consult for:  IV Heparin if INR < 2 Indication:  Permanent A.fib (bridge while off warfarin) No Known Allergies  Labs:  Recent Labs  05/19/12 1220 05/19/12 1230 05/19/12 1812 05/19/12 2336 05/20/12 0535 05/21/12 0430  HGB 10.9*  --   --   --  10.9* 10.6*  HCT 35.5*  --   --   --  34.9* 33.8*  PLT 139*  --   --   --  139* 133*  LABPROT  --  31.9*  --   --  32.4* 24.9*  INR  --  3.33*  --   --  3.40* 2.38*  CREATININE 2.58*  --  2.43*  --  2.32* 2.09*  CKTOTAL  --   --  142 118  --   --   CKMB  --   --  4.0 3.2  --   --   TROPONINI  --   --  <0.30 <0.30 <0.30  --    Estimated Creatinine Clearance is 36.3 ml/min.  Medications:  Scheduled:  . aspirin EC  81 mg Oral QHS  . atorvastatin  20 mg Oral q1800  . diltiazem  60 mg Oral Q12H  . furosemide  80 mg Oral BID  . insulin aspart  0-9 Units Subcutaneous TID WC  . isosorbide-hydrALAZINE  1 tablet Oral TID  . LORazepam  1 mg Oral QHS  . metoprolol  37.5 mg Oral TID  . nitroGLYCERIN  0.5 inch Topical Q6H  . sodium chloride  3 mL Intravenous Q12H  . [DISCONTINUED] furosemide  40 mg Intravenous BID  . [DISCONTINUED] glimepiride  1 mg Oral BID AC    Assessment: 62 year-old male on chronic Coumadin therapy for atrial fibrillation, admitted 3/31 with fluid overload, AoCRF, and diarrhea. The usual home warfarin dose is documented as 5 mg on Mon-Wed-Fri and 7.5 mg on all other days of the week (last taken 3/31). TAdmit INR was supratherapeutic at 3.33.  After clarification with Dr. Bryan Lemma yesterday, plan is no couamdin for now (Coumadin per Rx consult has since been d/c'd) for possible cath pending renal function.  Currently, waiting until INR <2 to start IV heparin as a bridge while off warfarin  Today's INR = 2.38, therefore will not start IV heparin today  Goals of Therapy:  INR 2-3 Monitor platelets by anticoagulation protocol: Yes   Plan:   No IV heparin  today  Coumadin on hold (has not had any in hospital)  Follow INR daily  Plan to start IV Heparin once INR  < 2  Darrol Angel, PharmD Pager: [phone] 05/21/2012 8:09 AM

## 2012-05-21 NOTE — Progress Notes (Signed)
S: Feels better.  Says he is up walking without SOB O:BP 143/79  Pulse 95  Temp(Src) 97.9 F (36.6 C) (Oral)  Resp 20  Ht 5\' 10"  (1.778 m)  Wt 93.8 kg (206 lb 12.7 oz)  BMI 29.67 kg/m2  SpO2 97%  Intake/Output Summary (Last 24 hours) at 05/21/12 1135 Last data filed at 05/21/12 1000  Gross per 24 hour  Intake   1200 ml  Output   1950 ml  Net   -750 ml   Weight change: -2.9 kg (-6 lb 6.3 oz) JYN:WGNFA and alert OZH:YQMVH,QIONG Resp:rare exp wheeze Abd:+ BS NTND Ext:tr-1+ edema NEURO:Ox3 CNI no asterixis   . aspirin EC  81 mg Oral QHS  . atorvastatin  20 mg Oral q1800  . diltiazem  60 mg Oral Q12H  . furosemide  80 mg Oral BID  . insulin aspart  0-9 Units Subcutaneous TID WC  . isosorbide-hydrALAZINE  1 tablet Oral TID  . LORazepam  1 mg Oral QHS  . metoprolol  37.5 mg Oral TID  . nitroGLYCERIN  0.5 inch Topical Q6H  . [START ON 05/22/2012] pneumococcal 23 valent vaccine  0.5 mL Intramuscular Tomorrow-1000  . sodium chloride  3 mL Intravenous Q12H   US Renal  05/20/2012  *RADIOLOGY REPORT*  Clinical Data: Acute renal failure  RENAL/URINARY TRACT ULTRASOUND COMPLETE  Comparison:  None.  Findings:  Right Kidney:  12.5 cm.  Mild renal parenchymal thinning. No hydronephrosis or renal mass.  Left Kidney:  11.6 cm.  Mild renal parenchymal thinning. No hydronephrosis or renal mass.  Bladder:  Post void.  Ascites.  IMPRESSION: No hydronephrosis.  Renal parenchymal thinning bilaterally.  Ascites.  This is a call report.   Original Report Authenticated By: Lacy Duverney, M.D.    Dg Chest Port 1 View  05/19/2012  *RADIOLOGY REPORT*  Clinical Data: CHF, shortness of breath and cough.  PORTABLE CHEST - 1 VIEW  Comparison: 06/28/2007.  Findings: Trachea is midline.  Heart size is accentuated by AP technique.  There may be tiny bilateral pleural effusions.  Lungs are otherwise clear.  IMPRESSION: Question tiny bilateral pleural effusions.   Original Report Authenticated By: Leanna Battles, M.D.     BMET    Component Value Date/Time   NA 141 05/21/2012 0430   K 4.5 05/21/2012 0430   CL 104 05/21/2012 0430   CO2 33* 05/21/2012 0430   GLUCOSE 94 05/21/2012 0430   BUN 61* 05/21/2012 0430   CREATININE 2.09* 05/21/2012 0430   CALCIUM 9.0 05/21/2012 0430   GFRNONAA 32* 05/21/2012 0430   GFRAA 37* 05/21/2012 0430   CBC    Component Value Date/Time   WBC 5.6 05/21/2012 0430   RBC 3.42* 05/21/2012 0430   HGB 10.6* 05/21/2012 0430   HCT 33.8* 05/21/2012 0430   PLT 133* 05/21/2012 0430   MCV 98.8 05/21/2012 0430   MCH 31.0 05/21/2012 0430   MCHC 31.4 05/21/2012 0430   RDW 15.6* 05/21/2012 0430     Assessment: 1.Acute on CKD3, improving 2. Hyperkalemia, resolved 3. A. Fib 4. Volume overload improving 5. Fe def  Plan: 1. Cont off ACE 2.  Cont with diuretics 3.  I would opt for myoview while here rather than outpt 4.  He has some bronchospasm and will most likely need bronchodilator at DC 5. IV Iron   Keymora Grillot T

## 2012-05-21 NOTE — Progress Notes (Signed)
Subjective:  No CP/SOB  Objective:  Temp:  [97.9 F (36.6 C)-98.8 F (37.1 C)] 98.8 F (37.1 C) (04/02 1426) Pulse Rate:  [86-96] 91 (04/02 1426) Resp:  [17-20] 18 (04/02 1426) BP: (111-155)/(62-79) 155/63 mmHg (04/02 1426) SpO2:  [96 %-100 %] 96 % (04/02 1426) Weight:  [93.8 kg (206 lb 12.7 oz)] 93.8 kg (206 lb 12.7 oz) (04/02 0442) Weight change: -2.9 kg (-6 lb 6.3 oz)  Intake/Output from previous day: 04/01 0701 - 04/02 0700 In: 1140 [P.O.:1140] Out: 2300 [Urine:2300]  Intake/Output from this shift: Total I/O In: 300 [P.O.:300] Out: 650 [Urine:650]  Physical Exam: General appearance: alert and no distress Neck: no adenopathy, no carotid bruit, no JVD, supple, symmetrical, trachea midline and thyroid not enlarged, symmetric, no tenderness/mass/nodules Lungs: clear to auscultation bilaterally Heart: irregularly irregular rhythm Extremities: extremities normal, atraumatic, no cyanosis or edema  Lab Results: Results for orders placed during the hospital encounter of 05/19/12 (from the past 48 hour(s))  POTASSIUM     Status: None   Collection Time    05/19/12  4:08 PM      Result Value Range   Potassium 5.0  3.5 - 5.1 mEq/L   Comment: DELTA CHECK NOTED     RESULTS VERIFIED VIA RECOLLECT  HEMOGLOBIN A1C     Status: Abnormal   Collection Time    05/19/12  4:30 PM      Result Value Range   Hemoglobin A1C 7.1 (*) <5.7 %   Comment: (NOTE)                                                                               According to the ADA Clinical Practice Recommendations for 2011, when     HbA1c is used as a screening test:      >=6.5%   Diagnostic of Diabetes Mellitus               (if abnormal result is confirmed)     5.7-6.4%   Increased risk of developing Diabetes Mellitus     References:Diagnosis and Classification of Diabetes Mellitus,Diabetes     Care,2011,34(Suppl 1):S62-S69 and Standards of Medical Care in             Diabetes - 2011,Diabetes Care,2011,34  (Suppl 1):S11-S61.   Mean Plasma Glucose 157 (*) <117 mg/dL  TSH     Status: None   Collection Time    05/19/12  4:30 PM      Result Value Range   TSH 1.230  0.350 - 4.500 uIU/mL  TROPONIN I     Status: None   Collection Time    05/19/12  6:12 PM      Result Value Range   Troponin I <0.30  <0.30 ng/mL   Comment:            Due to the release kinetics of cTnI,     a negative result within the first hours     of the onset of symptoms does not rule out     myocardial infarction with certainty.     If myocardial infarction is still suspected,     repeat the test at appropriate intervals.  BASIC METABOLIC PANEL  Status: Abnormal   Collection Time    05/19/12  6:12 PM      Result Value Range   Sodium 144  135 - 145 mEq/L   Potassium 5.6 (*) 3.5 - 5.1 mEq/L   Chloride 107  96 - 112 mEq/L   CO2 26  19 - 32 mEq/L   Glucose, Bld 59 (*) 70 - 99 mg/dL   BUN 68 (*) 6 - 23 mg/dL   Creatinine, Ser 9.60 (*) 0.50 - 1.35 mg/dL   Calcium 9.7  8.4 - 45.4 mg/dL   GFR calc non Af Amer 27 (*) >90 mL/min   GFR calc Af Amer 31 (*) >90 mL/min   Comment:            The eGFR has been calculated     using the CKD EPI equation.     This calculation has not been     validated in all clinical     situations.     eGFR's persistently     <90 mL/min signify     possible Chronic Kidney Disease.  CK TOTAL AND CKMB     Status: Abnormal   Collection Time    05/19/12  6:12 PM      Result Value Range   Total CK 142  7 - 232 U/L   CK, MB 4.0  0.3 - 4.0 ng/mL   Relative Index 2.8 (*) 0.0 - 2.5  HEPATIC FUNCTION PANEL     Status: Abnormal   Collection Time    05/19/12  6:12 PM      Result Value Range   Total Protein 7.1  6.0 - 8.3 g/dL   Albumin 3.6  3.5 - 5.2 g/dL   AST 28  0 - 37 U/L   ALT 17  0 - 53 U/L   Alkaline Phosphatase 71  39 - 117 U/L   Total Bilirubin 0.3  0.3 - 1.2 mg/dL   Bilirubin, Direct 0.1  0.0 - 0.3 mg/dL   Indirect Bilirubin 0.2 (*) 0.3 - 0.9 mg/dL  GLUCOSE, CAPILLARY      Status: Abnormal   Collection Time    05/19/12  6:32 PM      Result Value Range   Glucose-Capillary 50 (*) 70 - 99 mg/dL  GLUCOSE, CAPILLARY     Status: Abnormal   Collection Time    05/19/12  7:13 PM      Result Value Range   Glucose-Capillary 68 (*) 70 - 99 mg/dL  GLUCOSE, CAPILLARY     Status: Abnormal   Collection Time    05/19/12  7:40 PM      Result Value Range   Glucose-Capillary 123 (*) 70 - 99 mg/dL  GLUCOSE, CAPILLARY     Status: Abnormal   Collection Time    05/19/12  9:34 PM      Result Value Range   Glucose-Capillary 142 (*) 70 - 99 mg/dL  URINALYSIS, MICROSCOPIC ONLY     Status: None   Collection Time    05/19/12  9:38 PM      Result Value Range   Color, Urine YELLOW  YELLOW   APPearance CLEAR  CLEAR   Specific Gravity, Urine 1.012  1.005 - 1.030   pH 5.0  5.0 - 8.0   Glucose, UA NEGATIVE  NEGATIVE mg/dL   Hgb urine dipstick NEGATIVE  NEGATIVE   Bilirubin Urine NEGATIVE  NEGATIVE   Ketones, ur NEGATIVE  NEGATIVE mg/dL   Protein, ur NEGATIVE  NEGATIVE mg/dL  Urobilinogen, UA 0.2  0.0 - 1.0 mg/dL   Nitrite NEGATIVE  NEGATIVE   Leukocytes, UA NEGATIVE  NEGATIVE   Urine-Other       Value: NO FORMED ELEMENTS SEEN ON URINE MICROSCOPIC EXAMINATION  SODIUM, URINE, RANDOM     Status: None   Collection Time    05/19/12  9:38 PM      Result Value Range   Sodium, Ur 118    CREATININE, URINE, RANDOM     Status: None   Collection Time    05/19/12  9:38 PM      Result Value Range   Creatinine, Urine 23.9    TROPONIN I     Status: None   Collection Time    05/19/12 11:36 PM      Result Value Range   Troponin I <0.30  <0.30 ng/mL   Comment:            Due to the release kinetics of cTnI,     a negative result within the first hours     of the onset of symptoms does not rule out     myocardial infarction with certainty.     If myocardial infarction is still suspected,     repeat the test at appropriate intervals.  CK TOTAL AND CKMB     Status: Abnormal    Collection Time    05/19/12 11:36 PM      Result Value Range   Total CK 118  7 - 232 U/L   CK, MB 3.2  0.3 - 4.0 ng/mL   Relative Index 2.7 (*) 0.0 - 2.5  BASIC METABOLIC PANEL     Status: Abnormal   Collection Time    05/20/12  5:35 AM      Result Value Range   Sodium 144  135 - 145 mEq/L   Potassium 4.3  3.5 - 5.1 mEq/L   Comment: RESULT REPEATED AND VERIFIED     DELTA CHECK NOTED   Chloride 107  96 - 112 mEq/L   CO2 29  19 - 32 mEq/L   Glucose, Bld 37 (*) 70 - 99 mg/dL   Comment: REPEATED TO VERIFY     CRITICAL RESULT CALLED TO, READ BACK BY AND VERIFIED WITH:     J ZAPPIA AT 0637 ON 04.01.2014 BY NBROOKS   BUN 63 (*) 6 - 23 mg/dL   Creatinine, Ser 4.09 (*) 0.50 - 1.35 mg/dL   Calcium 8.9  8.4 - 81.1 mg/dL   GFR calc non Af Amer 28 (*) >90 mL/min   GFR calc Af Amer 33 (*) >90 mL/min   Comment:            The eGFR has been calculated     using the CKD EPI equation.     This calculation has not been     validated in all clinical     situations.     eGFR's persistently     <90 mL/min signify     possible Chronic Kidney Disease.  CBC     Status: Abnormal   Collection Time    05/20/12  5:35 AM      Result Value Range   WBC 5.1  4.0 - 10.5 K/uL   RBC 3.51 (*) 4.22 - 5.81 MIL/uL   Hemoglobin 10.9 (*) 13.0 - 17.0 g/dL   HCT 91.4 (*) 78.2 - 95.6 %   MCV 99.4  78.0 - 100.0 fL   MCH 31.1  26.0 - 34.0 pg  MCHC 31.2  30.0 - 36.0 g/dL   RDW 16.1 (*) 09.6 - 04.5 %   Platelets 139 (*) 150 - 400 K/uL  TROPONIN I     Status: None   Collection Time    05/20/12  5:35 AM      Result Value Range   Troponin I <0.30  <0.30 ng/mL   Comment:            Due to the release kinetics of cTnI,     a negative result within the first hours     of the onset of symptoms does not rule out     myocardial infarction with certainty.     If myocardial infarction is still suspected,     repeat the test at appropriate intervals.  PROTIME-INR     Status: Abnormal   Collection Time    05/20/12   5:35 AM      Result Value Range   Prothrombin Time 32.4 (*) 11.6 - 15.2 seconds   INR 3.40 (*) 0.00 - 1.49  GLUCOSE, CAPILLARY     Status: Abnormal   Collection Time    05/20/12  6:55 AM      Result Value Range   Glucose-Capillary 44 (*) 70 - 99 mg/dL   Comment 1 Documented in Chart     Comment 2 Notify RN    GLUCOSE, CAPILLARY     Status: Abnormal   Collection Time    05/20/12  7:14 AM      Result Value Range   Glucose-Capillary 68 (*) 70 - 99 mg/dL  GLUCOSE, CAPILLARY     Status: None   Collection Time    05/20/12  7:30 AM      Result Value Range   Glucose-Capillary 86  70 - 99 mg/dL  IRON AND TIBC     Status: Abnormal   Collection Time    05/20/12 11:10 AM      Result Value Range   Iron 46  42 - 135 ug/dL   TIBC 409  811 - 914 ug/dL   Saturation Ratios 14 (*) 20 - 55 %   UIBC 294  125 - 400 ug/dL  FERRITIN     Status: None   Collection Time    05/20/12 11:10 AM      Result Value Range   Ferritin 50  22 - 322 ng/mL  PARATHYROID HORMONE, INTACT (NO CA)     Status: Abnormal   Collection Time    05/20/12 11:10 AM      Result Value Range   PTH 94.6 (*) 14.0 - 72.0 pg/mL  GLUCOSE, CAPILLARY     Status: Abnormal   Collection Time    05/20/12 11:20 AM      Result Value Range   Glucose-Capillary 191 (*) 70 - 99 mg/dL  GLUCOSE, CAPILLARY     Status: Abnormal   Collection Time    05/20/12  4:12 PM      Result Value Range   Glucose-Capillary 142 (*) 70 - 99 mg/dL  GLUCOSE, CAPILLARY     Status: Abnormal   Collection Time    05/20/12 10:28 PM      Result Value Range   Glucose-Capillary 130 (*) 70 - 99 mg/dL  PROTIME-INR     Status: Abnormal   Collection Time    05/21/12  4:30 AM      Result Value Range   Prothrombin Time 24.9 (*) 11.6 - 15.2 seconds   INR 2.38 (*) 0.00 -  1.49  RENAL FUNCTION PANEL     Status: Abnormal   Collection Time    05/21/12  4:30 AM      Result Value Range   Sodium 141  135 - 145 mEq/L   Potassium 4.5  3.5 - 5.1 mEq/L   Chloride 104  96  - 112 mEq/L   CO2 33 (*) 19 - 32 mEq/L   Glucose, Bld 94  70 - 99 mg/dL   BUN 61 (*) 6 - 23 mg/dL   Creatinine, Ser 1.61 (*) 0.50 - 1.35 mg/dL   Calcium 9.0  8.4 - 09.6 mg/dL   Phosphorus 3.5  2.3 - 4.6 mg/dL   Albumin 3.1 (*) 3.5 - 5.2 g/dL   GFR calc non Af Amer 32 (*) >90 mL/min   GFR calc Af Amer 37 (*) >90 mL/min   Comment:            The eGFR has been calculated     using the CKD EPI equation.     This calculation has not been     validated in all clinical     situations.     eGFR's persistently     <90 mL/min signify     possible Chronic Kidney Disease.  CBC     Status: Abnormal   Collection Time    05/21/12  4:30 AM      Result Value Range   WBC 5.6  4.0 - 10.5 K/uL   RBC 3.42 (*) 4.22 - 5.81 MIL/uL   Hemoglobin 10.6 (*) 13.0 - 17.0 g/dL   HCT 04.5 (*) 40.9 - 81.1 %   MCV 98.8  78.0 - 100.0 fL   MCH 31.0  26.0 - 34.0 pg   MCHC 31.4  30.0 - 36.0 g/dL   RDW 91.4 (*) 78.2 - 95.6 %   Platelets 133 (*) 150 - 400 K/uL  GLUCOSE, CAPILLARY     Status: None   Collection Time    05/21/12  7:34 AM      Result Value Range   Glucose-Capillary 84  70 - 99 mg/dL  GLUCOSE, CAPILLARY     Status: Abnormal   Collection Time    05/21/12 12:03 PM      Result Value Range   Glucose-Capillary 197 (*) 70 - 99 mg/dL    Imaging: Imaging results have been reviewed  Assessment/Plan:   1. Principal Problem: 2.   Acute on chronic combined systolic and diastolic congestive heart failure, NYHA class 3 3. Active Problems: 4.   Type 2 diabetes mellitus 5.   Coronary artery disease, hx of CABG 1998, LIMA-LAD;VG-OM1-OM2-OM3; VG-PDA/PLA 6.   Atrial fibrillation, permanent 7.   Hypertension 8.   ARF (acute renal failure) 9.   Hyperkalemia 10.   Cardiomyopathy, EF 30-35% by echo 03/2012 11.   CKD (chronic kidney disease) stage 3, GFR 30-59 ml/min 12.   Cardiorenal syndrome with renal failure - acute on chronic renal failure with worsening HF. 13.   Atrial fibrillation with rapid  ventricular response 14.   Acute on chronic right-sided HF (heart failure) 15.   LBBB (left bundle branch block) 16.   PAD (peripheral artery disease) 17.   Chronic anticoagulation 18.   Dyslipidemia 19.   Time Spent Directly with Patient:  20 minutes  Length of Stay:  LOS: 2 days   I think pt is on the right trajectory. Wgt is decreasing, Scr is heading towards baseline. CAF with VR controlled on BB/CCB. Exam benign. Lungs clear,  Coumadin on hold secondary to supra therapeutic INR. Pt wishes to have myoview as an OP at our office which I think is reasonable, No active angina. I would convert topical nitrate to long acting oral nitrate (i.e imdur 30 mg). OK to D/C from our point of view with close office follow up.  Runell Gess 05/21/2012, 3:42 PM

## 2012-05-27 ENCOUNTER — Encounter (HOSPITAL_COMMUNITY): Payer: BC Managed Care – PPO

## 2012-05-28 ENCOUNTER — Other Ambulatory Visit: Payer: Self-pay

## 2012-05-28 ENCOUNTER — Ambulatory Visit (HOSPITAL_COMMUNITY)
Admission: RE | Admit: 2012-05-28 | Discharge: 2012-05-28 | Disposition: A | Discharge status: Home / Self Care | Payer: BC Managed Care – PPO | Source: Ambulatory Visit | Attending: Cardiovascular Disease | Admitting: Cardiovascular Disease

## 2012-05-28 DIAGNOSIS — Z9861 Coronary angioplasty status: Secondary | ICD-10-CM | POA: Insufficient documentation

## 2012-05-28 DIAGNOSIS — I509 Heart failure, unspecified: Secondary | ICD-10-CM | POA: Insufficient documentation

## 2012-05-28 DIAGNOSIS — E119 Type 2 diabetes mellitus without complications: Secondary | ICD-10-CM | POA: Insufficient documentation

## 2012-05-28 DIAGNOSIS — R0989 Other specified symptoms and signs involving the circulatory and respiratory systems: Secondary | ICD-10-CM | POA: Insufficient documentation

## 2012-05-28 DIAGNOSIS — I255 Ischemic cardiomyopathy: Secondary | ICD-10-CM

## 2012-05-28 DIAGNOSIS — I739 Peripheral vascular disease, unspecified: Secondary | ICD-10-CM | POA: Insufficient documentation

## 2012-05-28 DIAGNOSIS — J4489 Other specified chronic obstructive pulmonary disease: Secondary | ICD-10-CM | POA: Insufficient documentation

## 2012-05-28 DIAGNOSIS — I10 Essential (primary) hypertension: Secondary | ICD-10-CM | POA: Insufficient documentation

## 2012-05-28 DIAGNOSIS — Z87891 Personal history of nicotine dependence: Secondary | ICD-10-CM | POA: Insufficient documentation

## 2012-05-28 DIAGNOSIS — I447 Left bundle-branch block, unspecified: Secondary | ICD-10-CM | POA: Insufficient documentation

## 2012-05-28 DIAGNOSIS — Z8249 Family history of ischemic heart disease and other diseases of the circulatory system: Secondary | ICD-10-CM | POA: Insufficient documentation

## 2012-05-28 DIAGNOSIS — I4891 Unspecified atrial fibrillation: Secondary | ICD-10-CM | POA: Insufficient documentation

## 2012-05-28 DIAGNOSIS — R0609 Other forms of dyspnea: Secondary | ICD-10-CM | POA: Insufficient documentation

## 2012-05-28 DIAGNOSIS — J449 Chronic obstructive pulmonary disease, unspecified: Secondary | ICD-10-CM | POA: Insufficient documentation

## 2012-05-28 DIAGNOSIS — I2510 Atherosclerotic heart disease of native coronary artery without angina pectoris: Secondary | ICD-10-CM | POA: Insufficient documentation

## 2012-05-28 DIAGNOSIS — Z794 Long term (current) use of insulin: Secondary | ICD-10-CM | POA: Insufficient documentation

## 2012-05-28 MED ORDER — REGADENOSON 0.4 MG/5ML IV SOLN
0.4000 mg | Freq: Once | INTRAVENOUS | Status: AC
  Administered 2012-05-28: 0.4 mg via INTRAVENOUS

## 2012-05-28 MED ORDER — AMINOPHYLLINE 25 MG/ML IV SOLN
75.0000 mg | Freq: Once | INTRAVENOUS | Status: AC
  Administered 2012-05-28: 75 mg via INTRAVENOUS

## 2012-05-28 MED ORDER — TECHNETIUM TC 99M SESTAMIBI GENERIC - CARDIOLITE
10.0000 | Freq: Once | INTRAVENOUS | Status: AC | PRN
  Administered 2012-05-28: 10 via INTRAVENOUS

## 2012-05-28 MED ORDER — TECHNETIUM TC 99M SESTAMIBI GENERIC - CARDIOLITE
30.0000 | Freq: Once | INTRAVENOUS | Status: AC | PRN
  Administered 2012-05-28: 30 via INTRAVENOUS

## 2012-05-28 NOTE — Procedures (Addendum)
[facility] [address] [place] [address] [place] [address] [phone]  Cardiology Nuclear Med Study  TIMMEY LAMBA is a 62 y.o. male     MRN : [id number]     DOB: August 20, 1950  Procedure Date: 05/28/2012  Nuclear Med Background Indication for Stress Test:  Graft Patency, PTCA Patency and Post Hospital History:  COPD and CAD;PTCA-10/21/2007;CABG X7--03/02/1996 Cardiac Risk Factors: Family History - CAD, History of Smoking, Hypertension, IDDM Type 2, LBBB, PVD and A-FIB;CHF  Symptoms:  DOE   Nuclear Pre-Procedure Caffeine/Decaff Intake:  10:00pm NPO After: 8:00am   IV Site: R Forearm  IV 0.9% NS with Angio Cath:  22g  Chest Size (in):  44"  IV Started by: Emmit Pomfret, RN  Height: 5\' 10"  (1.778 m)  Cup Size: n/a  BMI:  Body mass index is 27.12 kg/(m^2). Weight:  189 lb (85.73 kg)   Tech Comments:  N/A    Nuclear Med Study 1 or 2 day study: 1 day  Stress Test Type:  Lexiscan  Order Authorizing Provider:  Susa Griffins, MD   Resting Radionuclide: Technetium 23m Sestamibi  Resting Radionuclide Dose: 11.0 mCi   Stress Radionuclide:  Technetium 60m Sestamibi  Stress Radionuclide Dose: 30.9 mCi           Stress Protocol Rest HR: 77 Stress HR:86  Rest BP: 127/87 Stress BP: 97/78  Exercise Time (min): n/a METS: n/a          Dose of Adenosine (mg):  n/a Dose of Lexiscan: 0.4 mg  Dose of Atropine (mg): n/a Dose of Dobutamine: n/a mcg/kg/min (at max HR)  Stress Test Technologist: Ernestene Mention, CCT Nuclear Technologist: Koren Shiver, CNMT   Rest Procedure:  Myocardial perfusion imaging was performed at rest 45 minutes following the intravenous administration of Technetium 66m Sestamibi. Stress Procedure:  The patient received IV Lexiscan 0.4 mg over 15-seconds.  Technetium 21m Sestamibi injected at 30-seconds.  Due to patient's drop in blood pressure, he was given IV Aminophylline 75 mg. Symptom was resolve during recovery.There  were no significant changes with Lexiscan.  Quantitative spect images were obtained after a 45 minute delay.  Transient Ischemic Dilatation (Normal <1.22):  1.01 Lung/Heart Ratio (Normal <0.45):  0.35 QGS EDV:  n/a ml QGS ESV:  n/a ml LV Ejection Fraction: Study not gated  Signed by       Rest ECG: Atrial Fibrilliation and ectopy  Stress ECG: No significant change from baseline ECG  QPS Raw Data Images:  Normal; no motion artifact; normal heart/lung ratio. Stress Images:  There is decreased uptake in the inferior wall. Rest Images:  There is decreased uptake in the inferior wall. Subtraction (SDS):  No evidence of ischemia.  Impression Exercise Capacity:  Lexiscan with no exercise. BP Response:  Normal blood pressure response. Clinical Symptoms:  No significant symptoms noted. ECG Impression:  No significant ST segment change suggestive of ischemia. Comparison with Prior Nuclear Study: No significant change from previous study  Overall Impression:  There was a small area of infero apical scar without ischemia unchanged from the prior study  LV Wall Motion:  Gating was not performed because of afib and ectopy   Runell Gess, MD  05/28/2012 3:13 PM

## 2012-06-07 ENCOUNTER — Encounter: Payer: Self-pay | Admitting: Pharmacist Clinician (PhC)/ Clinical Pharmacy Specialist

## 2012-06-07 DIAGNOSIS — I4891 Unspecified atrial fibrillation: Secondary | ICD-10-CM

## 2012-06-07 DIAGNOSIS — Z7901 Long term (current) use of anticoagulants: Secondary | ICD-10-CM

## 2012-07-30 ENCOUNTER — Other Ambulatory Visit (HOSPITAL_COMMUNITY): Payer: Self-pay | Admitting: Cardiovascular Disease

## 2012-07-30 ENCOUNTER — Telehealth (HOSPITAL_COMMUNITY): Payer: Self-pay | Admitting: Cardiovascular Disease

## 2012-07-30 DIAGNOSIS — I739 Peripheral vascular disease, unspecified: Secondary | ICD-10-CM

## 2012-07-30 DIAGNOSIS — I447 Left bundle-branch block, unspecified: Secondary | ICD-10-CM

## 2012-07-30 NOTE — Telephone Encounter (Signed)
FYI-When calling to schedule the patients Lower Ext Arterial Doppler, he stated that he does not want to have the test because he just came home from the hospital in February and he does not need it. He agreed to the echo and stress test.

## 2012-08-11 ENCOUNTER — Ambulatory Visit (HOSPITAL_COMMUNITY)
Admission: RE | Admit: 2012-08-11 | Discharge: 2012-08-11 | Disposition: A | Discharge status: Home / Self Care | Payer: BC Managed Care – PPO | Source: Ambulatory Visit | Attending: Cardiovascular Disease | Admitting: Cardiovascular Disease

## 2012-08-11 ENCOUNTER — Ambulatory Visit (INDEPENDENT_AMBULATORY_CARE_PROVIDER_SITE_OTHER): Payer: BC Managed Care – PPO | Admitting: Pharmacist Clinician (PhC)/ Clinical Pharmacy Specialist

## 2012-08-11 VITALS — BP 130/62 | HR 68

## 2012-08-11 DIAGNOSIS — I4891 Unspecified atrial fibrillation: Secondary | ICD-10-CM

## 2012-08-11 DIAGNOSIS — Z7901 Long term (current) use of anticoagulants: Secondary | ICD-10-CM

## 2012-08-11 DIAGNOSIS — I509 Heart failure, unspecified: Secondary | ICD-10-CM | POA: Insufficient documentation

## 2012-08-11 DIAGNOSIS — I447 Left bundle-branch block, unspecified: Secondary | ICD-10-CM

## 2012-08-11 NOTE — Progress Notes (Signed)
2D Echo Performed 08/11/2012    Dorothee Napierkowski, RCS  

## 2012-08-12 ENCOUNTER — Ambulatory Visit: Payer: BC Managed Care – PPO | Admitting: Pharmacist Clinician (PhC)/ Clinical Pharmacy Specialist

## 2012-08-26 ENCOUNTER — Telehealth (HOSPITAL_COMMUNITY): Payer: Self-pay | Admitting: Cardiovascular Disease

## 2012-08-26 ENCOUNTER — Encounter (HOSPITAL_COMMUNITY): Payer: BC Managed Care – PPO

## 2012-08-26 NOTE — Telephone Encounter (Signed)
SPOKE WITH PATIENT. HE REFUSES TO HAVE THE LOWER EXT ART DOPPLER DONE. HE STATES THAT IT IS A WASTE OF TIME.

## 2012-08-30 ENCOUNTER — Encounter: Payer: Self-pay | Admitting: Cardiovascular Disease

## 2012-09-03 ENCOUNTER — Encounter (HOSPITAL_COMMUNITY): Payer: BC Managed Care – PPO

## 2012-09-08 ENCOUNTER — Ambulatory Visit (INDEPENDENT_AMBULATORY_CARE_PROVIDER_SITE_OTHER): Payer: BC Managed Care – PPO | Admitting: Pharmacist Clinician (PhC)/ Clinical Pharmacy Specialist

## 2012-09-08 VITALS — BP 152/80 | HR 48

## 2012-09-08 DIAGNOSIS — Z7901 Long term (current) use of anticoagulants: Secondary | ICD-10-CM

## 2012-09-08 DIAGNOSIS — I4891 Unspecified atrial fibrillation: Secondary | ICD-10-CM

## 2012-09-16 ENCOUNTER — Other Ambulatory Visit: Payer: Self-pay

## 2012-09-16 MED ORDER — WARFARIN SODIUM 5 MG PO TABS: 5.0000 mg | ORAL_TABLET | Freq: Every day | ORAL | Status: DC

## 2012-09-16 NOTE — Telephone Encounter (Signed)
Rx was called in to pharmacy. 

## 2012-09-18 ENCOUNTER — Other Ambulatory Visit: Payer: Self-pay | Admitting: Pharmacist

## 2012-09-18 MED ORDER — WARFARIN SODIUM 5 MG PO TABS: 5.0000 mg | ORAL_TABLET | ORAL | Status: DC

## 2012-10-06 ENCOUNTER — Ambulatory Visit (INDEPENDENT_AMBULATORY_CARE_PROVIDER_SITE_OTHER): Payer: BC Managed Care – PPO | Admitting: Pharmacist Clinician (PhC)/ Clinical Pharmacy Specialist

## 2012-10-06 ENCOUNTER — Ambulatory Visit: Payer: BC Managed Care – PPO | Admitting: Pharmacist Clinician (PhC)/ Clinical Pharmacy Specialist

## 2012-10-06 VITALS — BP 140/72 | HR 64

## 2012-10-06 DIAGNOSIS — I4891 Unspecified atrial fibrillation: Secondary | ICD-10-CM

## 2012-10-06 DIAGNOSIS — Z7901 Long term (current) use of anticoagulants: Secondary | ICD-10-CM

## 2012-11-03 ENCOUNTER — Ambulatory Visit (INDEPENDENT_AMBULATORY_CARE_PROVIDER_SITE_OTHER): Payer: BC Managed Care – PPO | Admitting: Pharmacist Clinician (PhC)/ Clinical Pharmacy Specialist

## 2012-11-03 VITALS — BP 140/82 | HR 84

## 2012-11-03 DIAGNOSIS — Z7901 Long term (current) use of anticoagulants: Secondary | ICD-10-CM

## 2012-11-03 DIAGNOSIS — I4891 Unspecified atrial fibrillation: Secondary | ICD-10-CM

## 2012-11-24 ENCOUNTER — Ambulatory Visit (INDEPENDENT_AMBULATORY_CARE_PROVIDER_SITE_OTHER): Payer: BC Managed Care – PPO | Admitting: Cardiovascular Disease

## 2012-11-24 ENCOUNTER — Ambulatory Visit (INDEPENDENT_AMBULATORY_CARE_PROVIDER_SITE_OTHER): Payer: BC Managed Care – PPO | Admitting: Pharmacist Clinician (PhC)/ Clinical Pharmacy Specialist

## 2012-11-24 ENCOUNTER — Encounter: Payer: Self-pay | Admitting: Cardiovascular Disease

## 2012-11-24 VITALS — BP 158/84 | HR 87 | Ht 70.0 in | Wt 180.7 lb

## 2012-11-24 DIAGNOSIS — E785 Hyperlipidemia, unspecified: Secondary | ICD-10-CM

## 2012-11-24 DIAGNOSIS — I4891 Unspecified atrial fibrillation: Secondary | ICD-10-CM

## 2012-11-24 DIAGNOSIS — I739 Peripheral vascular disease, unspecified: Secondary | ICD-10-CM

## 2012-11-24 DIAGNOSIS — I2510 Atherosclerotic heart disease of native coronary artery without angina pectoris: Secondary | ICD-10-CM

## 2012-11-24 DIAGNOSIS — I10 Essential (primary) hypertension: Secondary | ICD-10-CM

## 2012-11-24 DIAGNOSIS — Z7901 Long term (current) use of anticoagulants: Secondary | ICD-10-CM

## 2012-11-24 NOTE — Assessment & Plan Note (Signed)
Status post angiography intervention back in 1997, at 75 by Dr. Susa Griffins. He had SFA disease. His last lower extremity or chill Doppler study was performed 2 years ago that did show a high-frequency signal in his left SFA. He is asymptomatic.

## 2012-11-24 NOTE — Assessment & Plan Note (Signed)
Status post coronary bypass grafting in 1998 the LIMA to his LAD, vein graft to obtuse margin branches 1,2 and 3 and to the PDA and PLA. He has ischemic cardiomyopathy with an ejection fraction in the 30-35% range. He had a low-risk nonischemic Myoview this past summer. He denies chest pain or shortness of breath. Because of his ejection fraction a diaper for him to Dr. Royann Shivers  for consideration of ICD therapy for primary prevention

## 2012-11-24 NOTE — Progress Notes (Signed)
11/24/2012 Radene Gunning   11-29-1950  [id number]  Primary Physician Lolita Patella, MD Primary Cardiologist: Runell Gess MD Roseanne Reno   HPI:  Mr. Frizell is a 62 year old married Caucasian male father of 2 daughters, grandfather and 2 grandchildren he was formally a patient of Dr. Gerlene Burdock Kandis Cocking. 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 He has a history of coronary artery disease status post bypass grafting in 1998 with a LIMA to his LAD, vein to obtuse marginal branches 1, 2, and 3 as well as to the PDA and PLA. 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 Suspect is otherwise are remarkable for treated diabetes, hypertension, hyperlipidemia. He stopped smoking October of 1997. He does have a family history of heart disease with both parents who had bypass surgery. He was hospitalized in March of this year with congestive heart failure and was diuresed. He also has chronic renal insufficiency followed by Dr. Primitivo Gauze. He has chronic atrial fibrillation   Current Outpatient Prescriptions  Medication Sig Dispense Refill  . aspirin EC 81 MG tablet Take 81 mg by mouth at bedtime.      Marland Kitchen atorvastatin (LIPITOR) 20 MG tablet Take 1 tablet (20 mg total) by mouth daily at 6 PM.  30 tablet  0  . diltiazem (CARDIZEM SR) 120 MG 12 hr capsule TAKE 1/2 TABLET BY MOUTH TWICE A DAY      . enalapril (VASOTEC) 20 MG tablet Take 20 mg by mouth daily.      . furosemide (LASIX) 80 MG tablet Take 1/2 tablet by mouth twice a day      . glimepiride (AMARYL) 2 MG tablet Take 2 mg by mouth 2 (two) times daily.      Marland Kitchen glucosamine-chondroitin 500-400 MG tablet Take 3 tablets by mouth daily.      . insulin glargine (LANTUS) 100 UNIT/ML injection Inject 8 Units into the skin daily.      Marland Kitchen LORazepam (ATIVAN) 1 MG  tablet Take 1 mg by mouth at bedtime.      . metoprolol (LOPRESSOR) 25 MG tablet Take 1.5 tablets (37.5 mg total) by mouth 3 (three) times daily.  90 tablet  0  . niacin 500 MG tablet 500 mg. Take 1 tablet by mouth twice a day,OTC      . vardenafil (LEVITRA) 20 MG tablet Take 20 mg by mouth daily as needed for erectile dysfunction.      Marland Kitchen warfarin (COUMADIN) 5 MG tablet Take 1 tablet (5 mg total) by mouth as directed.  135 tablet  1   No current facility-administered medications for this visit.    No Known Allergies  History   Social History  . Marital Status: Married    Spouse Name: N/A    Number of Children: N/A  . Years of Education: N/A   Occupational History  . Not on file.   Social History Main Topics  . Smoking status: Former Smoker    Quit date: 05/20/1995  . Smokeless tobacco: Never Used  . Alcohol Use: Yes     Comment: 2 drinks/week  . Drug Use: No     Comment: quit in 1972  . Sexual Activity: Not on file   Other Topics Concern  . Not on file   Social History Narrative  .  No narrative on file     Review of Systems: General: negative for chills, fever, night sweats or weight changes.  Cardiovascular: negative for chest pain, dyspnea on exertion, edema, orthopnea, palpitations, paroxysmal nocturnal dyspnea or shortness of breath Dermatological: negative for rash Respiratory: negative for cough or wheezing Urologic: negative for hematuria Abdominal: negative for nausea, vomiting, diarrhea, bright red blood per rectum, melena, or hematemesis Neurologic: negative for visual changes, syncope, or dizziness All other systems reviewed and are otherwise negative except as noted above.    Blood pressure 158/84, pulse 87, height 5\' 10"  (1.778 m), weight 180 lb 11.2 oz (81.965 kg).  General appearance: alert and no distress Neck: no adenopathy, no carotid bruit, no JVD, supple, symmetrical, trachea midline and thyroid not enlarged, symmetric, no  tenderness/mass/nodules Lungs: clear to auscultation bilaterally Heart: irregularly irregular rhythm Abdomen: soft, non-tender; bowel sounds normal; no masses,  no organomegaly Extremities: edema diminished pedal pulses and ddiminished pedal pulses  EKG atrial fibrillation with ventricular response of 87 and left bundle-branch block  ASSESSMENT AND PLAN:   Coronary artery disease, hx of CABG 1998, LIMA-LAD;VG-OM1-OM2-OM3; VG-PDA/PLA Status post coronary bypass grafting in 1998 the LIMA to his LAD, vein graft to obtuse margin branches 1,2 and 3 and to the PDA and PLA. He has ischemic cardiomyopathy with an ejection fraction in the 30-35% range. He had a low-risk nonischemic Myoview this past summer. He denies chest pain or shortness of breath. Because of his ejection fraction a diaper for him to Dr. Royann Shivers  for consideration of ICD therapy for primary prevention  Atrial fibrillation, permanent With left bundle branch block, rate controlled on Coumadin and a coagulation  PAD (peripheral artery disease) Status post angiography intervention back in 1997, at 98 by Dr. Susa Griffins. He had SFA disease. His last lower extremity or chill Doppler study was performed 2 years ago that did show a high-frequency signal in his left SFA. He is asymptomatic.  Dyslipidemia On statin therapy. His primary care physician will check a lipid and liver profile.  Hypertension Well-controlled on current medications      Runell Gess MD [facility], [facility] 11/24/2012 9:05 AM

## 2012-11-24 NOTE — Assessment & Plan Note (Signed)
With left bundle branch block, rate controlled on Coumadin and a coagulation

## 2012-11-24 NOTE — Assessment & Plan Note (Signed)
On statin therapy. His primary care physician will check a lipid and liver profile.

## 2012-11-24 NOTE — Assessment & Plan Note (Signed)
Well-controlled on current medications 

## 2012-11-24 NOTE — Patient Instructions (Addendum)
Please have Dr Nicholos Johns do labs-CMP,LIVER PROFILE  You have been referred to Dr Royann Shivers for ICD THERAPY  Your physician wants you to follow-up in 6 MONTH Dr Allyson Sabal.  You will receive a reminder letter in the mail two months in advance. If you don't receive a letter, please call our office to schedule the follow-up appointment.

## 2012-12-01 ENCOUNTER — Ambulatory Visit (INDEPENDENT_AMBULATORY_CARE_PROVIDER_SITE_OTHER): Payer: BC Managed Care – PPO | Admitting: Cardiovascular Disease

## 2012-12-01 ENCOUNTER — Encounter: Payer: Self-pay | Admitting: Cardiovascular Disease

## 2012-12-01 VITALS — BP 138/82 | HR 80 | Ht 70.0 in | Wt 183.3 lb

## 2012-12-01 DIAGNOSIS — I429 Cardiomyopathy, unspecified: Secondary | ICD-10-CM

## 2012-12-01 DIAGNOSIS — I2510 Atherosclerotic heart disease of native coronary artery without angina pectoris: Secondary | ICD-10-CM

## 2012-12-01 DIAGNOSIS — I509 Heart failure, unspecified: Secondary | ICD-10-CM

## 2012-12-01 DIAGNOSIS — I2589 Other forms of chronic ischemic heart disease: Secondary | ICD-10-CM

## 2012-12-01 DIAGNOSIS — N189 Chronic kidney disease, unspecified: Secondary | ICD-10-CM

## 2012-12-01 DIAGNOSIS — I255 Ischemic cardiomyopathy: Secondary | ICD-10-CM

## 2012-12-01 DIAGNOSIS — N179 Acute kidney failure, unspecified: Secondary | ICD-10-CM

## 2012-12-01 DIAGNOSIS — I428 Other cardiomyopathies: Secondary | ICD-10-CM

## 2012-12-01 DIAGNOSIS — I4891 Unspecified atrial fibrillation: Secondary | ICD-10-CM

## 2012-12-01 NOTE — Patient Instructions (Signed)
You will be contacted by Dr. Reece Leader office for consultation for a BIVICD.

## 2012-12-01 NOTE — Assessment & Plan Note (Signed)
His renal function seems to stabilized with a creatinine of 1.7-1.8. I do not think it would be prohibitive to give him a small amount of iodinated contrast for coronary sinus angiogram if this is necessary at the time of CRT-D.

## 2012-12-01 NOTE — Assessment & Plan Note (Signed)
He should remain on long-term anticoagulation. Atrial fibrillation ventricular rate response will have to be greatly improved for him to benefit from cardiac resynchronization therapy.

## 2012-12-01 NOTE — Assessment & Plan Note (Signed)
Mr. Condie has moderately severe ischemic cardiomyopathy with a left ventricular ejection fraction of around 30% and a long-standing left bundle branch block with a QRS duration of about 160 ms.  He meets criteria for primary prevention ICD implantation for ischemic cardiomyopathy (Prior myocardial infarction, left ventricular ejection fraction under 35%, heart failure NYHA class II, on comprehensive medical therapy >3 months). If his atrial fibrillation can be well rate controlled, then he would also benefit from implementation of CRT. He has generally had well-controlled congestive heart failure, but did have an episode of acute exacerbation earlier this year. Consider possible AV node ablation or at least much more aggressive AV blockade with pharmacological means. We discussed the various pros and cons of both defibrillator therapy as well as resynchronization therapy and have recommended that he be evaluated by an electrophysiologist. He is referred to Dr. Sharrell Ku. He is also planning to undergo right knee surgery before the end of the year. The 2 procedures will have to be timed well so that rehabilitation from one procedure does not interfere with the other procedure to

## 2012-12-01 NOTE — Progress Notes (Signed)
Patient ID: Maxwell Fitzgerald, male   DOB: 10-Mar-1950, 62 y.o.   MRN: [id number]      Reason for office visit Discuss ICD   Maxwell Fitzgerald is 62 years old and has moderate to severe ischemic cardiomyopathy and is now roughly 16 years status post bypass surgery. He has had depressed LV systolic function for many years. Had an episode of acute heart failure in February of this year and at that time his left ventricular ejection fraction was 30-35%. Despite being on optimal medical management he still has a similar LVEF.  He has not had any further episodes of acute heart failure and remains well compensated and very active. He does not have angina and a nuclear stress test performed a few months ago showed no evidence of reversible ischemia although there was extensive scar. He is in permanent atrial fibrillation and is on chronic warfarin anticoagulation. He has a left bundle branch block that has waxed and waned in severity, but most recently by ECG he has a QRS duration of 166 ms   Allergies  Allergen Reactions  . Potassium-Containing Compounds     Kidney failure    Current Outpatient Prescriptions  Medication Sig Dispense Refill  . aspirin EC 81 MG tablet Take 81 mg by mouth at bedtime.      Marland Kitchen atorvastatin (LIPITOR) 20 MG tablet Take 1 tablet (20 mg total) by mouth daily at 6 PM.  30 tablet  0  . diltiazem (CARDIZEM SR) 120 MG 12 hr capsule TAKE 1/2 TABLET BY MOUTH TWICE A DAY      . enalapril (VASOTEC) 20 MG tablet Take 20 mg by mouth daily.      . furosemide (LASIX) 80 MG tablet Take 1/2 tablet by mouth twice a day      . glimepiride (AMARYL) 2 MG tablet Take 2 mg by mouth 2 (two) times daily.      Marland Kitchen glucosamine-chondroitin 500-400 MG tablet Take 3 tablets by mouth daily.      . insulin glargine (LANTUS) 100 UNIT/ML injection Inject 8 Units into the skin daily.      Marland Kitchen LORazepam (ATIVAN) 1 MG tablet Take 1 mg by mouth at bedtime.      . metoprolol (LOPRESSOR) 25 MG tablet Take 1.5 tablets (37.5  mg total) by mouth 3 (three) times daily.  90 tablet  0  . niacin 500 MG tablet 500 mg. Take 1 tablet by mouth twice a day,OTC      . vardenafil (LEVITRA) 20 MG tablet Take 20 mg by mouth daily as needed for erectile dysfunction.      Marland Kitchen warfarin (COUMADIN) 5 MG tablet Take 1 tablet (5 mg total) by mouth as directed.  135 tablet  1   No current facility-administered medications for this visit.    Past Medical History  Diagnosis Date  . CHF (congestive heart failure)   . Renal disorder   . Type 2 diabetes mellitus   . COPD (chronic obstructive pulmonary disease)   . Hypertension   . PAD (peripheral artery disease) 1997    Bilateral SFA PTA in 1997  . Chronic anticoagulation     on coumadin  . Cardiomyopathy, EF 30-35% by echo 03/2012 05/19/2012    Decreased from 2009 Echo, 40-45%  . CKD (chronic kidney disease) stage 3, GFR 30-59 ml/min     Baseline BUN/Cr 09/2011 - 36/1.44  . Atrial fibrillation, permanent     on warfarin; recurred after DCCV  . LBBB (left bundle  branch block)     chronic  . Coronary artery disease     s/p CABG 1998; Myoview 2008 mild septal ischemia -no cath,  . Peripheral neuropathy     Past Surgical History  Procedure Laterality Date  . Peripheral vascular intervention  02/18/1996    Bilateral SFA PTA  . Coronary artery bypass graft  03/02/1996    x7, LIMA-LAD, SVG-OM1, SVG-OM2, SVG-OM3, SVG-PDA, SVG-PLA  . Carotid duplex  04/02/2012    MAX R&L ICA stenoses- 0-49% reduction  . Cardioversion  10/21/2007    Unsuccessful after 4 attempts  . Peripheral vascular intervention  06/30/1996    R SFA-high grade 95% focal stenosis crossed w/ a 5x1mm Cordis "Opti-5" balloon dilated at 5atm-56sec, 7atm-90sec, and 5atm-120sec  . Cardiovascular stress test  05/28/2012    Small area of infero apical scar w/o ischemia unchanged from prior  . Transthoracic echocardiogram  08/11/2012    EF 30-35%, moderate-severely reduced systolic function, the LA was moderate-severely dilated,  mild-moderate regurg of the tricuspid valve    Family History  Problem Relation Age of Onset  . CAD Mother   . Diabetes Mother   . CAD Father   . Diabetes Father   . CAD Brother     History   Social History  . Marital Status: Married    Spouse Name: N/A    Number of Children: N/A  . Years of Education: N/A   Occupational History  . Not on file.   Social History Main Topics  . Smoking status: Former Smoker    Quit date: 05/20/1995  . Smokeless tobacco: Never Used  . Alcohol Use: Yes     Comment: 2 drinks/week  . Drug Use: No     Comment: quit in 1972  . Sexual Activity: Not on file   Other Topics Concern  . Not on file   Social History Narrative  . No narrative on file    Review of systems: The patient specifically denies any chest pain at rest or with exertion, dyspnea at rest or with exertion, orthopnea, paroxysmal nocturnal dyspnea, syncope, palpitations, focal neurological deficits, intermittent claudication, lower extremity edema, unexplained weight gain, cough, hemoptysis or wheezing.  The patient also denies abdominal pain, nausea, vomiting, dysphagia, diarrhea, constipation, polyuria, polydipsia, dysuria, hematuria, frequency, urgency, abnormal bleeding or bruising, fever, chills, unexpected weight changes, mood swings, change in skin or hair texture, change in voice quality, auditory or visual problems, allergic reactions or rashes, new musculoskeletal complaints other than usual "aches and pains".   PHYSICAL EXAM BP 138/82  Pulse 80  Ht 5\' 10"  (1.778 m)  Wt 183 lb 4.8 oz (83.144 kg)  BMI 26.3 kg/m2  General: Alert, oriented x3, no distress Head: no evidence of trauma, PERRL, EOMI, no exophtalmos or lid lag, no myxedema, no xanthelasma; normal ears, nose and oropharynx Neck: normal jugular venous pulsations and no hepatojugular reflux; brisk carotid pulses without delay and no carotid bruits Chest: clear to auscultation, no signs of consolidation by  percussion or palpation, normal fremitus, symmetrical and full respiratory excursions, sternotomy scar Cardiovascular: normal position and quality of the apical impulse, irregular rhythm, normal first and paradoxically split second heart sounds, no murmurs, rubs or gallops Abdomen: no tenderness or distention, no masses by palpation, no abnormal pulsatility or arterial bruits, normal bowel sounds, no hepatosplenomegaly Extremities: no clubbing, cyanosis or edema; 2+ radial, ulnar and brachial pulses bilaterally; 2+ right femoral, posterior tibial and dorsalis pedis pulses; 2+ left femoral, posterior tibial and dorsalis pedis  pulses; no subclavian or femoral bruits Neurological: grossly nonfocal  EKG: AF, LBBB  Lipid Panel    BMET    Component Value Date/Time   NA 141 05/21/2012 0430   K 4.5 05/21/2012 0430   CL 104 05/21/2012 0430   CO2 33* 05/21/2012 0430   GLUCOSE 94 05/21/2012 0430   BUN 61* 05/21/2012 0430   CREATININE 2.09* 05/21/2012 0430   CALCIUM 9.0 05/21/2012 0430   GFRNONAA 32* 05/21/2012 0430   GFRAA 37* 05/21/2012 0430     ASSESSMENT AND PLAN Cardiomyopathy, EF 30-35% by echo 03/2012 Maxwell Fitzgerald has moderately severe ischemic cardiomyopathy with a left ventricular ejection fraction of around 30% and a long-standing left bundle branch block with a QRS duration of about 160 ms.  He meets criteria for primary prevention ICD implantation for ischemic cardiomyopathy (Prior myocardial infarction, left ventricular ejection fraction under 35%, heart failure NYHA class II, on comprehensive medical therapy >3 months). If his atrial fibrillation can be well rate controlled, then he would also benefit from implementation of CRT. He has generally had well-controlled congestive heart failure, but did have an episode of acute exacerbation earlier this year. Consider possible AV node ablation or at least much more aggressive AV blockade with pharmacological means. We discussed the various pros and cons of both  defibrillator therapy as well as resynchronization therapy and have recommended that he be evaluated by an electrophysiologist. He is referred to Dr. Sharrell Ku. He is also planning to undergo right knee surgery before the end of the year. The 2 procedures will have to be timed well so that rehabilitation from one procedure does not interfere with the other procedure to   Coronary artery disease, hx of CABG 1998, LIMA-LAD;VG-OM1-OM2-OM3; VG-PDA/PLA He has a very recent nuclear perfusion study that shows no evidence of reversible abnormalities.  Renal failure, acute on chronic His renal function seems to stabilized with a creatinine of 1.7-1.8. I do not think it would be prohibitive to give him a small amount of iodinated contrast for coronary sinus angiogram if this is necessary at the time of CRT-D.  Atrial fibrillation, permanent He should remain on long-term anticoagulation. Atrial fibrillation ventricular rate response will have to be greatly improved for him to benefit from cardiac resynchronization therapy.  Orders Placed This Encounter  Procedures  . Ambulatory referral to Cardiac Electrophysiology   No orders of the defined types were placed in this encounter.    Junious Silk, MD, [facility] [phone] office [phone] pager

## 2012-12-01 NOTE — Assessment & Plan Note (Signed)
He has a very recent nuclear perfusion study that shows no evidence of reversible abnormalities.

## 2012-12-04 ENCOUNTER — Ambulatory Visit: Payer: BC Managed Care – PPO | Admitting: Cardiovascular Disease

## 2012-12-15 ENCOUNTER — Ambulatory Visit (INDEPENDENT_AMBULATORY_CARE_PROVIDER_SITE_OTHER): Payer: BC Managed Care – PPO | Admitting: Pharmacist Clinician (PhC)/ Clinical Pharmacy Specialist

## 2012-12-15 VITALS — BP 136/72 | HR 56

## 2012-12-15 DIAGNOSIS — Z7901 Long term (current) use of anticoagulants: Secondary | ICD-10-CM

## 2012-12-15 DIAGNOSIS — I4891 Unspecified atrial fibrillation: Secondary | ICD-10-CM

## 2012-12-15 LAB — POCT INR: INR: 3.4

## 2012-12-16 ENCOUNTER — Institutional Professional Consult (permissible substitution): Payer: BC Managed Care – PPO | Admitting: Internal Medicine

## 2012-12-18 ENCOUNTER — Encounter: Payer: Self-pay | Admitting: *Deleted

## 2012-12-18 ENCOUNTER — Encounter: Payer: Self-pay | Admitting: Internal Medicine

## 2012-12-18 ENCOUNTER — Ambulatory Visit (INDEPENDENT_AMBULATORY_CARE_PROVIDER_SITE_OTHER): Payer: BC Managed Care – PPO | Admitting: Internal Medicine

## 2012-12-18 VITALS — BP 144/97 | HR 71 | Ht 70.0 in | Wt 185.0 lb

## 2012-12-18 DIAGNOSIS — I5022 Chronic systolic (congestive) heart failure: Secondary | ICD-10-CM

## 2012-12-18 DIAGNOSIS — I4891 Unspecified atrial fibrillation: Secondary | ICD-10-CM

## 2012-12-18 NOTE — Assessment & Plan Note (Signed)
His atrial fibrillation is well controlled. He will continue systemic anticoagulation.

## 2012-12-18 NOTE — Patient Instructions (Signed)
Your physician has recommended that you have a defibrillator inserted. An implantable cardioverter defibrillator (ICD) is a small device that is placed in your chest or, in rare cases, your abdomen. This device uses electrical pulses or shocks to help control life-threatening, irregular heartbeats that could lead the heart to suddenly stop beating (sudden cardiac arrest). Leads are attached to the ICD that goes into your heart. This is done in the hospital and usually requires an overnight stay. Please see the instruction sheet given to you today for more information.  Continue your current medications as directed. Have your Coumadin checked as scheduled on 01/05/13 & your  Pre procedure lab work:  Cbc with differential, bmp,

## 2012-12-18 NOTE — Progress Notes (Signed)
    HPI Maxwell Fitzgerald is referred today by Dr. Croitoru for consideration for BiV ICD implantation. The patient has a long-standing ischemic cardiomyopathy, status post bypass surgery in 2008. He developed atrial fibrillation in 2009 along with long-standing left bundle branch block. He initially underwent cardioversion but developed early return of atrial fibrillation. He has had one hospitalization nearly a year ago for congestive heart failure in conjunction with pneumonia. He is much improved and now has class IIA heart failure symptoms. He has minimal peripheral edema and overall feels well maintaining an active lifestyle. He has never had syncope. He has had a very nicely controlled ventricular rate in atrial fibrillation on medical therapy. He has chronic renal insufficiency. Allergies  Allergen Reactions  . Potassium-Containing Compounds     Kidney failure     Current Outpatient Prescriptions  Medication Sig Dispense Refill  . aspirin EC 81 MG tablet Take 81 mg by mouth at bedtime.      . atorvastatin (LIPITOR) 20 MG tablet Take 1 tablet (20 mg total) by mouth daily at 6 PM.  30 tablet  0  . diltiazem (CARDIZEM SR) 120 MG 12 hr capsule TAKE 1/2 TABLET BY MOUTH TWICE A DAY      . enalapril (VASOTEC) 20 MG tablet Take 20 mg by mouth daily.      . furosemide (LASIX) 80 MG tablet Take 1/2 tablet by mouth twice a day      . glimepiride (AMARYL) 2 MG tablet Take 1 mg by mouth 2 (two) times daily.       . glucosamine-chondroitin 500-400 MG tablet Take 3 tablets by mouth daily.      . insulin glargine (LANTUS) 100 UNIT/ML injection Inject 8 Units into the skin daily.      . LORazepam (ATIVAN) 1 MG tablet Take 1 mg by mouth at bedtime.      . metoprolol (LOPRESSOR) 25 MG tablet Take 1.5 tablets (37.5 mg total) by mouth 3 (three) times daily.  90 tablet  0  . niacin 500 MG tablet 500 mg. Take 1 tablet by mouth twice a day,OTC      . vardenafil (LEVITRA) 20 MG tablet Take 20 mg by mouth daily as  needed for erectile dysfunction.      . warfarin (COUMADIN) 5 MG tablet Take 1 tablet (5 mg total) by mouth as directed.  135 tablet  1   No current facility-administered medications for this visit.     Past Medical History  Diagnosis Date  . CHF (congestive heart failure)   . Renal disorder   . Type 2 diabetes mellitus   . COPD (chronic obstructive pulmonary disease)   . Hypertension   . PAD (peripheral artery disease) 1997    Bilateral SFA PTA in 1997  . Chronic anticoagulation     on coumadin  . Cardiomyopathy, EF 30-35% by echo 03/2012 05/19/2012    Decreased from 2009 Echo, 40-45%  . CKD (chronic kidney disease) stage 3, GFR 30-59 ml/min     Baseline BUN/Cr 09/2011 - 36/1.44  . Atrial fibrillation, permanent     on warfarin; recurred after DCCV  . LBBB (left bundle branch block)     chronic  . Coronary artery disease     s/p CABG 1998; Myoview 2008 mild septal ischemia -no cath,  . Peripheral neuropathy     ROS:   All systems reviewed and negative except as noted in the HPI.   Past Surgical History  Procedure Laterality Date  .   Peripheral vascular intervention  02/18/1996    Bilateral SFA PTA  . Coronary artery bypass graft  03/02/1996    x7, LIMA-LAD, SVG-OM1, SVG-OM2, SVG-OM3, SVG-PDA, SVG-PLA  . Carotid duplex  04/02/2012    MAX R&L ICA stenoses- 0-49% reduction  . Cardioversion  10/21/2007    Unsuccessful after 4 attempts  . Peripheral vascular intervention  06/30/1996    R SFA-high grade 95% focal stenosis crossed w/ a 5x2mm Cordis "Opti-5" balloon dilated at 5atm-56sec, 7atm-90sec, and 5atm-120sec  . Cardiovascular stress test  05/28/2012    Small area of infero apical scar w/o ischemia unchanged from prior  . Transthoracic echocardiogram  08/11/2012    EF 30-35%, moderate-severely reduced systolic function, the LA was moderate-severely dilated, mild-moderate regurg of the tricuspid valve     Family History  Problem Relation Age of Onset  . CAD Mother   .  Diabetes Mother   . CAD Father   . Diabetes Father   . CAD Brother      History   Social History  . Marital Status: Married    Spouse Name: N/A    Number of Children: N/A  . Years of Education: N/A   Occupational History  . Not on file.   Social History Main Topics  . Smoking status: Former Smoker    Quit date: 05/20/1995  . Smokeless tobacco: Never Used  . Alcohol Use: Yes     Comment: 2 drinks/week  . Drug Use: No     Comment: quit in 1972  . Sexual Activity: Not on file   Other Topics Concern  . Not on file   Social History Narrative  . No narrative on file     BP 144/97  Pulse 71  Ht 5' 10" (1.778 m)  Wt 185 lb (83.915 kg)  BMI 26.54 kg/m2  Physical Exam:  Well appearing 62-year-old man,NAD HEENT: Unremarkable Neck:  6 cm JVD, no thyromegally Back:  No CVA tenderness Lungs:  Clear with no wheezes, rales, or rhonchi. HEART:  IRegular rate rhythm, no murmurs, no rubs, no clicks, split S2. Abd:  soft, positive bowel sounds, no organomegally, no rebound, no guarding Ext:  2 plus pulses, trace peripheral edema, no cyanosis, no clubbing Skin:  No rashes no nodules Neuro:  CN II through XII intact, motor grossly intact  EKG - atrial fibrillation with left bundle branch block  Assess/Plan: 

## 2012-12-18 NOTE — Assessment & Plan Note (Signed)
His symptoms are class IIA.he has left bundle branch block and a QRS duration of 165 ms. We discussed the treatment options in detail. The risk, goals, benefits, and expectations of ICD implantation with a biventricular device have been discussed with the patient, and he would like to proceed with this procedure. We will schedule this in the next few weeks.

## 2012-12-25 ENCOUNTER — Other Ambulatory Visit: Payer: Self-pay

## 2012-12-30 ENCOUNTER — Institutional Professional Consult (permissible substitution): Payer: BC Managed Care – PPO | Admitting: Internal Medicine

## 2012-12-31 ENCOUNTER — Telehealth: Payer: Self-pay | Admitting: Internal Medicine

## 2012-12-31 ENCOUNTER — Encounter (HOSPITAL_COMMUNITY): Payer: Self-pay | Admitting: Pharmacy Technician

## 2012-12-31 NOTE — Telephone Encounter (Signed)
Dr Ladona Ridgel has ordered a BMP and CBC  These will be sent to Dr Merrily Brittle per his request

## 2012-12-31 NOTE — Telephone Encounter (Signed)
New message    Have order for labs on 11-17---Dr Merrily Brittle has also requested labs---do not want to duplicate labs----did we order a cmet test because that is what Dr Merrily Brittle wants.  Pls call pt and let him know if cmet was ordered

## 2013-01-05 ENCOUNTER — Telehealth (INDEPENDENT_AMBULATORY_CARE_PROVIDER_SITE_OTHER): Payer: BC Managed Care – PPO | Admitting: *Deleted

## 2013-01-05 ENCOUNTER — Ambulatory Visit (INDEPENDENT_AMBULATORY_CARE_PROVIDER_SITE_OTHER): Payer: BC Managed Care – PPO | Admitting: Pharmacist Clinician (PhC)/ Clinical Pharmacy Specialist

## 2013-01-05 ENCOUNTER — Ambulatory Visit: Payer: BC Managed Care – PPO | Admitting: Pharmacist Clinician (PhC)/ Clinical Pharmacy Specialist

## 2013-01-05 DIAGNOSIS — Z79899 Other long term (current) drug therapy: Secondary | ICD-10-CM

## 2013-01-05 DIAGNOSIS — I4891 Unspecified atrial fibrillation: Secondary | ICD-10-CM

## 2013-01-05 DIAGNOSIS — Z7901 Long term (current) use of anticoagulants: Secondary | ICD-10-CM

## 2013-01-05 LAB — COMPREHENSIVE METABOLIC PANEL
ALT: 36 U/L (ref 0–53)
AST: 36 U/L (ref 0–37)
Albumin: 4.3 g/dL (ref 3.5–5.2)
Alkaline Phosphatase: 55 U/L (ref 39–117)
BUN: 92 mg/dL — ABNORMAL HIGH (ref 6–23)
Chloride: 103 mEq/L (ref 96–112)
Glucose, Bld: 69 mg/dL — ABNORMAL LOW (ref 70–99)
Potassium: 4.8 mEq/L (ref 3.5–5.3)
Sodium: 138 mEq/L (ref 135–145)
Total Bilirubin: 0.3 mg/dL (ref 0.3–1.2)
Total Protein: 7.1 g/dL (ref 6.0–8.3)

## 2013-01-05 LAB — CBC
Hemoglobin: 12.6 g/dL — ABNORMAL LOW (ref 13.0–17.0)
MCH: 34.1 pg — ABNORMAL HIGH (ref 26.0–34.0)
MCHC: 34.9 g/dL (ref 30.0–36.0)
Platelets: 173 10*3/uL (ref 150–400)
RBC: 3.7 MIL/uL — ABNORMAL LOW (ref 4.22–5.81)
RDW: 14.7 % (ref 11.5–15.5)

## 2013-01-05 LAB — PROTIME-INR
INR: 2.13 — ABNORMAL HIGH (ref <1.50)
Prothrombin Time: 23.1 seconds — ABNORMAL HIGH (ref 11.6–15.2)

## 2013-01-05 NOTE — Telephone Encounter (Signed)
Call from lab requesting orders for pt.  Stated pt in lab and said Belenda Cruise knows what pt needs.  Stated Dr. Ladona Ridgel needed some labs too and Belenda Cruise would order them.  Belenda Cruise not available.  RN reviewed chart.  Pt will need PT/INR and per phone note by Dennis Bast, RN, pt need BMP and CBC for Dr. Ladona Ridgel.  Note also stated pt needs CMP for Dr. Briant Cedar.  Chrissy informed labs will be ordered.  Call to Dr. Lubertha Basque office and spoke w/ Tresa Endo, RN.  Informed of situation.  Advised CBC and CMP and she will send results to Dr. Briant Cedar.    Labs ordered.  Belenda Cruise, PharmD, notified to look for PT/INR results as labs ordered in Dr. Lubertha Basque name.

## 2013-01-06 ENCOUNTER — Telehealth: Payer: Self-pay | Admitting: Internal Medicine

## 2013-01-06 NOTE — Telephone Encounter (Addendum)
His INR is ok.  He will not need to hold per Dr Ladona Ridgel

## 2013-01-06 NOTE — Telephone Encounter (Signed)
New problem:  Pt states he is calling to find out how he should take his coumadin prior to his cath procedure. I called coumadin, Jasmine December told me to send msg to the doctor/nurse. Please advise

## 2013-01-07 MED ORDER — SODIUM CHLORIDE 0.9 % IR SOLN
80.0000 mg | Status: DC
  Filled 2013-01-07: qty 2

## 2013-01-07 MED ORDER — CEFAZOLIN SODIUM-DEXTROSE 2-3 GM-% IV SOLR: 2.0000 g | INTRAVENOUS | Status: DC

## 2013-01-07 MED ORDER — CEFAZOLIN SODIUM-DEXTROSE 2-3 GM-% IV SOLR
2.0000 g | INTRAVENOUS | Status: DC
  Filled 2013-01-07: qty 50

## 2013-01-07 MED ORDER — GENTAMICIN SULFATE 40 MG/ML IJ SOLN
80.0000 mg | INTRAMUSCULAR | Status: DC
  Filled 2013-01-07: qty 2

## 2013-01-08 ENCOUNTER — Encounter (HOSPITAL_COMMUNITY)
Admission: RE | Disposition: A | Discharge status: Home / Self Care | Payer: Self-pay | Source: Ambulatory Visit | Attending: Internal Medicine

## 2013-01-08 ENCOUNTER — Ambulatory Visit (HOSPITAL_COMMUNITY)
Admission: RE | Admit: 2013-01-08 | Discharge: 2013-01-09 | Disposition: A | Discharge status: Home / Self Care | Payer: BC Managed Care – PPO | Source: Ambulatory Visit | Attending: Internal Medicine | Admitting: Internal Medicine

## 2013-01-08 ENCOUNTER — Encounter (HOSPITAL_COMMUNITY): Payer: Self-pay | Admitting: General Practice

## 2013-01-08 DIAGNOSIS — J4489 Other specified chronic obstructive pulmonary disease: Secondary | ICD-10-CM | POA: Insufficient documentation

## 2013-01-08 DIAGNOSIS — Z951 Presence of aortocoronary bypass graft: Secondary | ICD-10-CM | POA: Insufficient documentation

## 2013-01-08 DIAGNOSIS — E785 Hyperlipidemia, unspecified: Secondary | ICD-10-CM

## 2013-01-08 DIAGNOSIS — I129 Hypertensive chronic kidney disease with stage 1 through stage 4 chronic kidney disease, or unspecified chronic kidney disease: Secondary | ICD-10-CM | POA: Insufficient documentation

## 2013-01-08 DIAGNOSIS — E119 Type 2 diabetes mellitus without complications: Secondary | ICD-10-CM

## 2013-01-08 DIAGNOSIS — Z7982 Long term (current) use of aspirin: Secondary | ICD-10-CM | POA: Insufficient documentation

## 2013-01-08 DIAGNOSIS — I1310 Hypertensive heart and chronic kidney disease without heart failure, with stage 1 through stage 4 chronic kidney disease, or unspecified chronic kidney disease: Secondary | ICD-10-CM

## 2013-01-08 DIAGNOSIS — I2589 Other forms of chronic ischemic heart disease: Secondary | ICD-10-CM | POA: Insufficient documentation

## 2013-01-08 DIAGNOSIS — I50811 Acute right heart failure: Secondary | ICD-10-CM

## 2013-01-08 DIAGNOSIS — I5043 Acute on chronic combined systolic (congestive) and diastolic (congestive) heart failure: Secondary | ICD-10-CM

## 2013-01-08 DIAGNOSIS — I4891 Unspecified atrial fibrillation: Secondary | ICD-10-CM

## 2013-01-08 DIAGNOSIS — I447 Left bundle-branch block, unspecified: Secondary | ICD-10-CM

## 2013-01-08 DIAGNOSIS — I429 Cardiomyopathy, unspecified: Secondary | ICD-10-CM

## 2013-01-08 DIAGNOSIS — I509 Heart failure, unspecified: Secondary | ICD-10-CM | POA: Insufficient documentation

## 2013-01-08 DIAGNOSIS — I739 Peripheral vascular disease, unspecified: Secondary | ICD-10-CM

## 2013-01-08 DIAGNOSIS — I10 Essential (primary) hypertension: Secondary | ICD-10-CM

## 2013-01-08 DIAGNOSIS — E875 Hyperkalemia: Secondary | ICD-10-CM

## 2013-01-08 DIAGNOSIS — Z7901 Long term (current) use of anticoagulants: Secondary | ICD-10-CM

## 2013-01-08 DIAGNOSIS — N179 Acute kidney failure, unspecified: Secondary | ICD-10-CM

## 2013-01-08 DIAGNOSIS — I5022 Chronic systolic (congestive) heart failure: Secondary | ICD-10-CM

## 2013-01-08 DIAGNOSIS — N183 Chronic kidney disease, stage 3 unspecified: Secondary | ICD-10-CM

## 2013-01-08 DIAGNOSIS — J449 Chronic obstructive pulmonary disease, unspecified: Secondary | ICD-10-CM | POA: Insufficient documentation

## 2013-01-08 DIAGNOSIS — I2510 Atherosclerotic heart disease of native coronary artery without angina pectoris: Secondary | ICD-10-CM

## 2013-01-08 HISTORY — PX: BI-VENTRICULAR IMPLANTABLE CARDIOVERTER DEFIBRILLATOR: SHX5459

## 2013-01-08 HISTORY — PX: BI-VENTRICULAR IMPLANTABLE CARDIOVERTER DEFIBRILLATOR  (CRT-D): SHX5747

## 2013-01-08 LAB — GLUCOSE, CAPILLARY
Glucose-Capillary: 99 mg/dL (ref 70–99)
Glucose-Capillary: 117 mg/dL — ABNORMAL HIGH (ref 70–99)
Glucose-Capillary: 150 mg/dL — ABNORMAL HIGH (ref 70–99)

## 2013-01-08 LAB — SURGICAL PCR SCREEN: Staphylococcus aureus: NEGATIVE

## 2013-01-08 LAB — PROTIME-INR: INR: 1.78 — ABNORMAL HIGH (ref 0.00–1.49)

## 2013-01-08 SURGERY — BI-VENTRICULAR IMPLANTABLE CARDIOVERTER DEFIBRILLATOR  (CRT-D)
Anesthesia: LOCAL

## 2013-01-08 MED ORDER — FUROSEMIDE 40 MG PO TABS
40.0000 mg | ORAL_TABLET | Freq: Two times a day (BID) | ORAL | Status: DC
Start: 2013-01-08 — End: 2013-01-09
  Administered 2013-01-08: 40 mg via ORAL
  Filled 2013-01-08 (×4): qty 1

## 2013-01-08 MED ORDER — FENTANYL CITRATE 0.05 MG/ML IJ SOLN
INTRAMUSCULAR | Status: AC
  Filled 2013-01-08: qty 2

## 2013-01-08 MED ORDER — MUPIROCIN 2 % EX OINT
TOPICAL_OINTMENT | CUTANEOUS | Status: AC
  Filled 2013-01-08: qty 22

## 2013-01-08 MED ORDER — WARFARIN SODIUM 7.5 MG PO TABS
7.5000 mg | ORAL_TABLET | ORAL | Status: DC
  Filled 2013-01-08: qty 1

## 2013-01-08 MED ORDER — ACETAMINOPHEN 325 MG PO TABS
325.0000 mg | ORAL_TABLET | ORAL | Status: DC | PRN
  Administered 2013-01-08 – 2013-01-09 (×3): 650 mg via ORAL
  Filled 2013-01-08 (×3): qty 2

## 2013-01-08 MED ORDER — SODIUM CHLORIDE 0.9 % IV SOLN: INTRAVENOUS | Status: DC

## 2013-01-08 MED ORDER — HEPARIN (PORCINE) IN NACL 2-0.9 UNIT/ML-% IJ SOLN
INTRAMUSCULAR | Status: AC
  Filled 2013-01-08: qty 500

## 2013-01-08 MED ORDER — LORAZEPAM 1 MG PO TABS
1.0000 mg | ORAL_TABLET | Freq: Every day | ORAL | Status: DC
  Administered 2013-01-08: 1 mg via ORAL
  Filled 2013-01-08: qty 1

## 2013-01-08 MED ORDER — MIDAZOLAM HCL 5 MG/5ML IJ SOLN
INTRAMUSCULAR | Status: AC
  Filled 2013-01-08: qty 5

## 2013-01-08 MED ORDER — METOPROLOL TARTRATE 25 MG PO TABS
37.5000 mg | ORAL_TABLET | Freq: Three times a day (TID) | ORAL | Status: DC
  Administered 2013-01-08 (×2): 37.5 mg via ORAL
  Filled 2013-01-08 (×6): qty 1

## 2013-01-08 MED ORDER — INSULIN GLARGINE 100 UNIT/ML ~~LOC~~ SOLN
8.0000 [IU] | Freq: Every day | SUBCUTANEOUS | Status: DC
  Filled 2013-01-08 (×2): qty 0.08

## 2013-01-08 MED ORDER — CHLORHEXIDINE GLUCONATE 4 % EX LIQD: 60.0000 mL | Freq: Once | CUTANEOUS | Status: DC

## 2013-01-08 MED ORDER — GLIMEPIRIDE 2 MG PO TABS
2.0000 mg | ORAL_TABLET | Freq: Two times a day (BID) | ORAL | Status: DC
Start: 2013-01-08 — End: 2013-01-09
  Administered 2013-01-08: 2 mg via ORAL
  Filled 2013-01-08 (×4): qty 1

## 2013-01-08 MED ORDER — MUPIROCIN 2 % EX OINT
TOPICAL_OINTMENT | Freq: Two times a day (BID) | CUTANEOUS | Status: DC
Start: 2013-01-08 — End: 2013-01-08
  Administered 2013-01-08: 07:00:00 via NASAL

## 2013-01-08 MED ORDER — ATORVASTATIN CALCIUM 20 MG PO TABS
20.0000 mg | ORAL_TABLET | Freq: Every day | ORAL | Status: DC
  Administered 2013-01-08: 20 mg via ORAL
  Filled 2013-01-08 (×2): qty 1

## 2013-01-08 MED ORDER — LOSARTAN POTASSIUM 25 MG PO TABS
25.0000 mg | ORAL_TABLET | Freq: Every day | ORAL | Status: DC
  Administered 2013-01-08: 25 mg via ORAL
  Filled 2013-01-08 (×2): qty 1

## 2013-01-08 MED ORDER — NIACIN 500 MG PO TABS
500.0000 mg | ORAL_TABLET | Freq: Two times a day (BID) | ORAL | Status: DC
  Filled 2013-01-08 (×4): qty 1

## 2013-01-08 MED ORDER — ONDANSETRON HCL 4 MG/2ML IJ SOLN: 4.0000 mg | Freq: Four times a day (QID) | INTRAMUSCULAR | Status: DC | PRN

## 2013-01-08 MED ORDER — CEFAZOLIN SODIUM 1-5 GM-% IV SOLN
1.0000 g | Freq: Four times a day (QID) | INTRAVENOUS | Status: AC
  Administered 2013-01-08 – 2013-01-09 (×3): 1 g via INTRAVENOUS
  Filled 2013-01-08 (×3): qty 50

## 2013-01-08 MED ORDER — ASPIRIN EC 81 MG PO TBEC
81.0000 mg | DELAYED_RELEASE_TABLET | Freq: Every day | ORAL | Status: DC
Start: 2013-01-08 — End: 2013-01-09
  Administered 2013-01-08: 81 mg via ORAL
  Filled 2013-01-08 (×2): qty 1

## 2013-01-08 MED ORDER — ASPIRIN 81 MG PO CHEW
CHEWABLE_TABLET | ORAL | Status: AC
  Filled 2013-01-08: qty 1

## 2013-01-08 MED ORDER — WARFARIN SODIUM 5 MG PO TABS: 5.0000 mg | ORAL_TABLET | Freq: Every day | ORAL | Status: DC

## 2013-01-08 MED ORDER — WARFARIN - PHYSICIAN DOSING INPATIENT: Freq: Every day | Status: DC

## 2013-01-08 MED ORDER — WARFARIN SODIUM 5 MG PO TABS
5.0000 mg | ORAL_TABLET | ORAL | Status: DC
  Filled 2013-01-08: qty 1

## 2013-01-08 MED ORDER — LIDOCAINE HCL (PF) 1 % IJ SOLN
INTRAMUSCULAR | Status: AC
  Filled 2013-01-08: qty 60

## 2013-01-08 NOTE — CV Procedure (Signed)
BiV ICD implant via the left subclavian vein without immediate complication. N#[id number]

## 2013-01-08 NOTE — Progress Notes (Signed)
Patient walking in hallway, instructed him he was on bedrest with bathroom privledges and the more he moved around the more chance of his lead wires being displaced.  He states he will be fine and only wanted to walk to the front desk.  Ambulated to front desk and then back to room. Will continue to monitor.  Maxwell Fitzgerald

## 2013-01-08 NOTE — Interval H&P Note (Signed)
History and Physical Interval Note:  01/08/2013 7:01 AM  Maxwell Fitzgerald  has presented today for surgery, with the diagnosis of Afib  The various methods of treatment have been discussed with the patient and family. After consideration of risks, benefits and other options for treatment, the patient has consented to  Procedure(s): BI-VENTRICULAR IMPLANTABLE CARDIOVERTER DEFIBRILLATOR  (CRT-D) (N/A) as a surgical intervention .  The patient's history has been reviewed, patient examined, no change in status, stable for surgery.  I have reviewed the patient's chart and labs.  Questions were answered to the patient's satisfaction.     Leonia Reeves.D.

## 2013-01-08 NOTE — H&P (View-Only) (Signed)
HPI [name] is referred today by Dr. Royann Fitzgerald for consideration for BiV ICD implantation. The patient has a long-standing ischemic cardiomyopathy, status post bypass surgery in 2008. He developed atrial fibrillation in 2009 along with long-standing left bundle branch block. He initially underwent cardioversion but developed early return of atrial fibrillation. He has had one hospitalization nearly a year ago for congestive heart failure in conjunction with pneumonia. He is much improved and now has class IIA heart failure symptoms. He has minimal peripheral edema and overall feels well maintaining an active lifestyle. He has never had syncope. He has had a very nicely controlled ventricular rate in atrial fibrillation on medical therapy. He has chronic renal insufficiency. Allergies  Allergen Reactions  . Potassium-Containing Compounds     Kidney failure     Current Outpatient Prescriptions  Medication Sig Dispense Refill  . aspirin EC 81 MG tablet Take 81 mg by mouth at bedtime.      Marland Kitchen atorvastatin (LIPITOR) 20 MG tablet Take 1 tablet (20 mg total) by mouth daily at 6 PM.  30 tablet  0  . diltiazem (CARDIZEM SR) 120 MG 12 hr capsule TAKE 1/2 TABLET BY MOUTH TWICE A DAY      . enalapril (VASOTEC) 20 MG tablet Take 20 mg by mouth daily.      . furosemide (LASIX) 80 MG tablet Take 1/2 tablet by mouth twice a day      . glimepiride (AMARYL) 2 MG tablet Take 1 mg by mouth 2 (two) times daily.       Marland Kitchen glucosamine-chondroitin 500-400 MG tablet Take 3 tablets by mouth daily.      . insulin glargine (LANTUS) 100 UNIT/ML injection Inject 8 Units into the skin daily.      Marland Kitchen LORazepam (ATIVAN) 1 MG tablet Take 1 mg by mouth at bedtime.      . metoprolol (LOPRESSOR) 25 MG tablet Take 1.5 tablets (37.5 mg total) by mouth 3 (three) times daily.  90 tablet  0  . niacin 500 MG tablet 500 mg. Take 1 tablet by mouth twice a day,OTC      . vardenafil (LEVITRA) 20 MG tablet Take 20 mg by mouth daily as  needed for erectile dysfunction.      Marland Kitchen warfarin (COUMADIN) 5 MG tablet Take 1 tablet (5 mg total) by mouth as directed.  135 tablet  1   No current facility-administered medications for this visit.     Past Medical History  Diagnosis Date  . CHF (congestive heart failure)   . Renal disorder   . Type 2 diabetes mellitus   . COPD (chronic obstructive pulmonary disease)   . Hypertension   . PAD (peripheral artery disease) 1997    Bilateral SFA PTA in 1997  . Chronic anticoagulation     on coumadin  . Cardiomyopathy, EF 30-35% by echo 03/2012 05/19/2012    Decreased from 2009 Echo, 40-45%  . CKD (chronic kidney disease) stage 3, GFR 30-59 ml/min     Baseline BUN/Cr 09/2011 - 36/1.44  . Atrial fibrillation, permanent     on warfarin; recurred after DCCV  . LBBB (left bundle branch block)     chronic  . Coronary artery disease     s/p CABG 1998; Myoview 2008 mild septal ischemia -no cath,  . Peripheral neuropathy     ROS:   All systems reviewed and negative except as noted in the HPI.   Past Surgical History  Procedure Laterality Date  .  Peripheral vascular intervention  02/18/1996    Bilateral SFA PTA  . Coronary artery bypass graft  03/02/1996    x7, LIMA-LAD, SVG-OM1, SVG-OM2, SVG-OM3, SVG-PDA, SVG-PLA  . Carotid duplex  04/02/2012    MAX R&L ICA stenoses- 0-49% reduction  . Cardioversion  10/21/2007    Unsuccessful after 4 attempts  . Peripheral vascular intervention  06/30/1996    R SFA-high grade 95% focal stenosis crossed w/ a 5x70mm Cordis "Opti-5" balloon dilated at 5atm-56sec, 7atm-90sec, and 5atm-120sec  . Cardiovascular stress test  05/28/2012    Small area of infero apical scar w/o ischemia unchanged from prior  . Transthoracic echocardiogram  08/11/2012    EF 30-35%, moderate-severely reduced systolic function, the LA was moderate-severely dilated, mild-moderate regurg of the tricuspid valve     Family History  Problem Relation Age of Onset  . CAD Mother   .  Diabetes Mother   . CAD Father   . Diabetes Father   . CAD Brother      History   Social History  . Marital Status: Married    Spouse Name: N/A    Number of Children: N/A  . Years of Education: N/A   Occupational History  . Not on file.   Social History Main Topics  . Smoking status: Former Smoker    Quit date: 05/20/1995  . Smokeless tobacco: Never Used  . Alcohol Use: Yes     Comment: 2 drinks/week  . Drug Use: No     Comment: quit in 1972  . Sexual Activity: Not on file   Other Topics Concern  . Not on file   Social History Narrative  . No narrative on file     BP 144/97  Pulse 71  Ht 5\' 10"  (1.778 m)  Wt 185 lb (83.915 kg)  BMI 26.54 kg/m2  Physical Exam:  Well appearing 62 year old man,NAD HEENT: Unremarkable Neck:  6 cm JVD, no thyromegally Back:  No CVA tenderness Lungs:  Clear with no wheezes, rales, or rhonchi. HEART:  IRegular rate rhythm, no murmurs, no rubs, no clicks, split S2. Abd:  soft, positive bowel sounds, no organomegally, no rebound, no guarding Ext:  2 plus pulses, trace peripheral edema, no cyanosis, no clubbing Skin:  No rashes no nodules Neuro:  CN II through XII intact, motor grossly intact  EKG - atrial fibrillation with left bundle branch block  Assess/Plan:

## 2013-01-08 NOTE — Interval H&P Note (Signed)
ICD Criteria  Current LVEF (within 6 months):30%  NYHA Functional Classification: Class II  Heart Failure History:  Yes, Duration of heart failure since onset is > 9 months  Non-Ischemic Dilated Cardiomyopathy History:  No.  Atrial Fibrillation/Atrial Flutter:  Yes, A-Fib/A-Flutter type: Permanent (>1 year).  Ventricular Tachycardia History:  No.  Cardiac Arrest History:  No  History of Syndromes with Risk of Sudden Death:  No.  Previous ICD:  No.  Electrophysiology Study: No.  Anticoagulation Therapy:  Patient is on anticoagulation therapy, anticoagulation was held prior to procedure.   Beta Blocker Therapy:  Yes.   Ace Inhibitor/ARB Therapy:  Yes.History and Physical Interval Note:  01/08/2013 9:31 AM  Maxwell Fitzgerald  has presented today for surgery, with the diagnosis of Afib  The various methods of treatment have been discussed with the patient and family. After consideration of risks, benefits and other options for treatment, the patient has consented to  Procedure(s): BI-VENTRICULAR IMPLANTABLE CARDIOVERTER DEFIBRILLATOR  (CRT-D) (N/A) as a surgical intervention .  The patient's history has been reviewed, patient examined, no change in status, stable for surgery.  I have reviewed the patient's chart and labs.  Questions were answered to the patient's satisfaction.     Lewayne Bunting

## 2013-01-09 ENCOUNTER — Telehealth: Payer: Self-pay | Admitting: Internal Medicine

## 2013-01-09 ENCOUNTER — Telehealth: Payer: Self-pay | Admitting: *Deleted

## 2013-01-09 ENCOUNTER — Encounter (HOSPITAL_COMMUNITY): Payer: Self-pay | Admitting: *Deleted

## 2013-01-09 ENCOUNTER — Ambulatory Visit (HOSPITAL_COMMUNITY): Payer: BC Managed Care – PPO

## 2013-01-09 DIAGNOSIS — I10 Essential (primary) hypertension: Secondary | ICD-10-CM

## 2013-01-09 DIAGNOSIS — I2589 Other forms of chronic ischemic heart disease: Secondary | ICD-10-CM

## 2013-01-09 LAB — PROTIME-INR
INR: 2.26 — ABNORMAL HIGH (ref 0.00–1.49)
Prothrombin Time: 24.2 seconds — ABNORMAL HIGH (ref 11.6–15.2)

## 2013-01-09 LAB — GLUCOSE, CAPILLARY: Glucose-Capillary: 104 mg/dL — ABNORMAL HIGH (ref 70–99)

## 2013-01-09 MED ORDER — LOSARTAN POTASSIUM 50 MG PO TABS: ORAL_TABLET | ORAL | Status: DC

## 2013-01-09 NOTE — Telephone Encounter (Signed)
New problem   Pt needs a call back about his medication dosage he got 2 conflicting way to take it.  Please give him a call back.

## 2013-01-09 NOTE — Op Note (Signed)
NAMETRAMELL, PIECHOTA                 ACCOUNT NO.:  [id number]  MEDICAL RECORD NO.:  [id number]  LOCATION:  3W37C                        FACILITY:  [facility]  PHYSICIAN:  Doylene Canning. Ladona Ridgel, MD    DATE OF BIRTH:  25-Feb-1950  DATE OF PROCEDURE:  01/08/2013 DATE OF DISCHARGE:                              OPERATIVE REPORT   PROCEDURE PERFORMED:  Insertion of a biventricular ICD.  INDICATION:  Chronic systolic heart failure class II secondary to an ischemic cardiomyopathy, with longstanding severe left ventricular dysfunction, ejection fraction 30% by echo despite maximal medical therapy with beta-blockers and ACE inhibitors in the setting of left bundle branch block and a QRS duration of 164 milliseconds.  INTRODUCTION:  The patient is a very pleasant 62 year old man who underwent bypass surgery in 2008 with a maze procedure done at that time.  He was initially unable to maintain sinus rhythm.  He has had persistent severe left ventricular dysfunction with an ejection fraction of 30% by echo despite maximal medical therapy.  He has class II heart failure symptoms with left bundle-branch block and a QRS duration of 164 milliseconds, and now presents for insertion of a biventricular ICD.  DESCRIPTION OF PROCEDURE:  After informed consent was obtained, the patient was taken to the diagnostic EP lab in a fasting state.  After usual preparation and draping, intravenous fentanyl and midazolam were given for sedation.  A 30 mL of lidocaine was infiltrated into the left infraclavicular region.  A 7-cm incision was carried out over this region and electrocautery was utilized to dissect down to the fascial plane.  The left subclavian vein was then punctured x3 and the St. Jude Durata model 7122, 65 cm active-fixation defibrillation lead, serial [id number] was advanced to the right ventricle and the St. Jude model 2088T, 52 cm active fixation pacing lead, serial #[id number] was advanced to the right  atrium.  Mapping was first carried out in the right ventricle at the final site on the RV apical septum, the R-waves measured 15 mV and with the lead actively fixed, the pacing impedance was 500 ohms.  The threshold was 0.8 V at 0.5 milliseconds and 10 V pacing did not stimulate the diaphragm.  With the ventricular lead in satisfactory position, attention was then turned to placement of atrial lead, which was placed in anterolateral portion of the right atrium where fibrillation waves measured between 1 and 2 mV.  With the lead actively fixed, the pacing impedance was 426 ohms.  With these satisfactory parameters, attention was then turned to placement of the left ventricular lead.  The coronary sinus guiding catheter was advanced with a 6-French hexapolar EP catheter and used to cannulate the coronary sinus.  The guiding catheter was advanced into the coronary sinus. Venography of the coronary sinus was then carried out demonstrating an acceptable lateral vein which was utilized for left ventricular lead insertion.  An angioplasty 0.014 guidewire was advanced into the lateral vein, and the St. Jude Quartet model 1458Q, 86 cm quadripolar pacing lead, serial #[id number] was advanced over the guidewire and into the lateral vein and the left ventricle.  At this location, pacing was carried out in multiple sites and  the threshold was always satisfactory. At this point, the LV lead was liberated from the guiding catheter in the usual manner and the leads were secured to the subpectoral fascia with a figure-of-eight silk suture.  Sewing sleeve was secured with silk suture.  Electrocautery was then utilized to make a subcutaneous pocket. Electrocautery was utilized to assure hemostasis.  Antibiotic irrigation was utilized to irrigate the pocket and electrocautery was utilized to assure hemostasis.  The St. Jude Quadra Assura biventricular ICD serial [id number] was connected to the atrial RV and LV  leads and placed back into the subcutaneous pocket.  The pocket was irrigated with antibiotic irrigation.  The generator was secured with silk suture, and the incision was closed with 2-0 and 3-0 Vicryl.  At this point, I scrubbed out of the case to supervise defibrillation threshold testing.  The patient was more deeply sedated under my direct supervision with intravenous fentanyl and Versed.  Ventricular fibrillation was then induced with a T-wave shock.  A 20-joule shock was then delivered which restored atrial fibrillation and terminated ventricular fibrillation.  No additional defibrillation threshold testing was carried out.  Benzoin and Steri-Strips were painted on the skin.  A pressure dressing was applied, and the patient was returned to his room in satisfactory condition.  COMPLICATIONS:  There were no immediate procedure complications.  RESULTS:  This demonstrates successful implantation of a St. Jude biventricular ICD in a patient with a longstanding ischemic cardiomyopathy, chronic systolic heart failure, left bundle-branch block, QRS duration 164 milliseconds, atrial fibrillation despite maximal medical therapy.     Doylene Canning. Ladona Ridgel, MD     GWT/MEDQ  D:  01/08/2013  T:  01/08/2013  Job:  [id number]

## 2013-01-09 NOTE — Telephone Encounter (Signed)
Left message for patient to decrease Losartan to 25mg  daily due to renal function (per Dr Ladona Ridgel at discharge) and also that the [facility] office will be calling him to schedule Coumadin follow up in the next week.

## 2013-01-09 NOTE — Progress Notes (Signed)
   ELECTROPHYSIOLOGY ROUNDING NOTE    Patient Name: Maxwell Fitzgerald Date of Encounter: 01/09/2013    SUBJECTIVE:Patient feels well this morning.  No chest pain or shortness of breath.  S/p CRTD implant 01-08-2013.   TELEMETRY: Reviewed telemetry pt in atrial fibrillation with occasional V pacing Filed Vitals:   01/08/13 1401 01/08/13 2119 01/08/13 2201 01/09/13 0522  BP: 153/75 153/90 139/87 136/87  Pulse: 78 78  79  Temp: 97.2 F (36.2 C) 98.5 F (36.9 C)  98.3 F (36.8 C)  TempSrc:  Oral  Oral  Resp: 18 18  18   Height:      Weight:      SpO2: 100% 100%  100%    Intake/Output Summary (Last 24 hours) at 01/09/13 0725 Last data filed at 01/08/13 1900  Gross per 24 hour  Intake    305 ml  Output      0 ml  Net    305 ml    CURRENT MEDICATIONS: . aspirin EC  81 mg Oral QHS  . atorvastatin  20 mg Oral q1800  . furosemide  40 mg Oral BID  . glimepiride  2 mg Oral BID WC  . insulin glargine  8 Units Subcutaneous Daily  . LORazepam  1 mg Oral QHS  . losartan  25 mg Oral QHS  . metoprolol  37.5 mg Oral TID  . niacin  500 mg Oral BID  . warfarin  5 mg Oral Custom  . warfarin  7.5 mg Oral Custom  . Warfarin - Physician Dosing Inpatient   Does not apply q1800     Radiology/Studies:  Final result pending, leads in stable position.  PHYSICAL EXAM Left chest without hematoma/ecchymosis  DEVICE INTERROGATION: Device interrogation pending   Wound care, arm mobility, restrictions, shock plan reviewed with patient.  Routine follow up scheduled.   A/P 1. S/p BiV ICD  2. Atrial fibrillation 3. Stage 4 renal failure Rec: he is ready for discharge home. I would like him on the same meds as he came in on except reduce losartan to 25 mg daily due to renal insufficiency. His cardizem should be restarted for rate control. Would like INR to be in the low 2's as much as possible.  Leonia Reeves.D.

## 2013-01-09 NOTE — Progress Notes (Signed)
LATE ENTRY:  Hypoglycemic Event  CBG: 56  Treatment: 15 GM carbohydrate snack  Symptoms: None  Follow-up CBG: Time:2156 CBG Result:65  Treatment: 45 GM carbohydrate meal  Follow-up CBG: Time:2250 CBG Result:125   Possible Reasons for Event: Inadequate meal intake  Comments/MD notified:  Patient was asymptomatic throughout. MD not notified.  CBG returned to acceptable values after meal intake.    Duane Lope  Remember to initiate Hypoglycemia Order Set & complete

## 2013-01-09 NOTE — Telephone Encounter (Signed)
Called stating he was discharged this AM and Dr. Ladona Ridgel told him to decrease Lorsartan from 100mg  to 50 mg.  States then he gets a call from [facility] advising him to take 25 mg of Losartan.  Wants to know what he should be taking.  Spoke w/ [facility] who states he should be taking the 25 mg of Losartan due to Creat being elevated.  Advised pt.  Will send another Rx in for him to [facility].- 50 mg to take 1/2 tablet

## 2013-01-09 NOTE — Discharge Summary (Signed)
ELECTROPHYSIOLOGY PROCEDURE DISCHARGE SUMMARY    Patient ID: Maxwell Fitzgerald,  MRN: [id number], DOB/AGE: [birth year] 63 y.o.  Admit date: 01/08/2013 Discharge date: 01/09/2013  Primary Care Physician: Elias Else, MD Primary Cardiologist: Thurmon Fair, MD Electrophysiologist: Lewayne Bunting, MD  Primary Discharge Diagnosis:  Ischemic cardiomyopathy, congestive heart failure, and LBBB status post CRTD implantation this admission  Secondary Discharge Diagnosis:  1.  Type II diabetes 2.  COPD 3.  Hypertension 4.  Peripheral artery disease - bilateral SFA PTA 1997 5.  Permanent atrial fibrillation 6.  Chronic anticoagulation with Warfarin 7.  Chronic kidney disease - stage III 8.  CAD - s/p CABG 1998; myoview 2008 with mild septal ischemia  Allergies  Allergen Reactions  . Potassium Chloride   . Potassium-Containing Compounds     Kidney failure: Specifically Potassium Chloride     Procedures This Admission:  1.  Implantation of a STJ cardiac resynchronization therapy ICD on 01-08-2013 by Dr Ladona Ridgel.  The patient received a STJ Quadra Assura ICD with model number 2088 right atrial lead, 7122 right ventricular lead, and 1458Q left ventricular lead.  There were no immediate post procedure complications. 2.  CXR on 01-09-2013 demonstrated no pneumothorax status post device implantation.   Brief HPI: Maxwell Fitzgerald is a 62 y.o. male was referred to electrophysiology in the outpatient setting for consideration of CRTD implantation.  Past medical history includes ischemic cardiomyopathy, congestive heart failure and left bundle branch block.  The patient has persistent LV dysfunction despite guideline directed therapy.  Risks, benefits, and alternatives to ICD implantation were reviewed with the patient who wished to proceed.   Hospital Course:  The patient was admitted and underwent implantation of a STJ CRTD with details as outlined above.   He was monitored on telemetry overnight  which demonstrated atrial fibrillation with intermittent ventricular pacing.  Left chest was without hematoma or ecchymosis.  The device was interrogated and found to be functioning normally.  CXR was obtained and demonstrated no pneumothorax status post device implantation.  Wound care, arm mobility, and restrictions were reviewed with the patient.  Dr Ladona Ridgel examined the patient and considered them stable for discharge to home.  Due to renal insuffiency, his Losartan was decreased to 25mg  daily.  The [facility] office will contact the patient for Coumadin clinic follow up in the next week.   The patient's discharge medications include an ARB (Losartan) and beta blocker (Metoprolol).   Discharge Vitals: Blood pressure 136/87, pulse 79, temperature 98.3 F (36.8 C), temperature source Oral, resp. rate 18, height 5\' 10"  (1.778 m), weight 178 lb (80.74 kg), SpO2 100.00%.   Labs:   Lab Results  Component Value Date   WBC 9.0 01/05/2013   HGB 12.6* 01/05/2013   HCT 36.1* 01/05/2013   MCV 97.6 01/05/2013   PLT 173 01/05/2013    Recent Labs Lab 01/05/13 1128  NA 138  K 4.8  CL 103  CO2 25  BUN 92*  CREATININE 2.93*  CALCIUM 9.1  PROT 7.1  BILITOT 0.3  ALKPHOS 55  ALT 36  AST 36  GLUCOSE 69*    Discharge Medications:    Medication List         aspirin EC 81 MG tablet  Take 81 mg by mouth at bedtime.     atorvastatin 20 MG tablet  Commonly known as:  LIPITOR  Take 1 tablet (20 mg total) by mouth daily at 6 PM.     CHILDRENS CHEWABLE MULTI VITS PO  Take 2 tablets by mouth daily.     diltiazem 120 MG tablet  Commonly known as:  CARDIZEM  Take 60 mg by mouth 2 (two) times daily.     furosemide 80 MG tablet  Commonly known as:  LASIX  Take 40 mg by mouth 2 (two) times daily.     glimepiride 2 MG tablet  Commonly known as:  AMARYL  Take 2 mg by mouth 2 (two) times daily.     glucosamine-chondroitin 500-400 MG tablet  Take 3 tablets by mouth daily.     insulin  glargine 100 UNIT/ML injection  Commonly known as:  LANTUS  Inject 8 Units into the skin daily.     LORazepam 1 MG tablet  Commonly known as:  ATIVAN  Take 1 mg by mouth at bedtime.     losartan 100 MG tablet  Commonly known as:  COZAAR  Take 100 mg by mouth at bedtime.     metoprolol tartrate 25 MG tablet  Commonly known as:  LOPRESSOR  Take 1.5 tablets (37.5 mg total) by mouth 3 (three) times daily.     niacin 500 MG tablet  Take 500 mg by mouth 2 (two) times daily.     vardenafil 20 MG tablet  Commonly known as:  LEVITRA  Take 20 mg by mouth daily as needed for erectile dysfunction.     warfarin 5 MG tablet  Commonly known as:  COUMADIN  Take 5-7.5 mg by mouth daily. 7.5 mg sundays and thursdays and 5 mg all other days        Disposition:      Discharge Orders   Future Appointments Provider Department Dept Phone   01/22/2013 3:30 PM [facility] 1 [facility] [place] [facility] [phone]   04/09/2013 10:00 AM Marinus Maw, MD [facility] [phone]   Future Orders Complete By Expires   Diet - low sodium heart healthy  As directed    Increase activity slowly  As directed      Follow-up Information   Follow up with [facility] On 01/22/2013. (At 3:30 PM for wound check)    Specialty:  Cardiology   Contact information:   [address], [address] [place] [address] [phone]      Follow up with Lewayne Bunting, MD On 04/09/2013. (At 10:00 AM)    Specialty:  Cardiology   Contact information:   [address] [place] [address] [phone]       Duration of Discharge Encounter: Greater than 30 minutes including physician time.  Signed,

## 2013-01-14 ENCOUNTER — Ambulatory Visit: Payer: BC Managed Care – PPO | Admitting: Pharmacist Clinician (PhC)/ Clinical Pharmacy Specialist

## 2013-01-22 ENCOUNTER — Ambulatory Visit (INDEPENDENT_AMBULATORY_CARE_PROVIDER_SITE_OTHER)
Admission: RE | Admit: 2013-01-22 | Discharge: 2013-01-22 | Disposition: A | Discharge status: Home / Self Care | Payer: BC Managed Care – PPO | Source: Ambulatory Visit | Attending: Internal Medicine | Admitting: Internal Medicine

## 2013-01-22 ENCOUNTER — Ambulatory Visit (INDEPENDENT_AMBULATORY_CARE_PROVIDER_SITE_OTHER): Payer: BC Managed Care – PPO | Admitting: *Deleted

## 2013-01-22 DIAGNOSIS — T82198A Other mechanical complication of other cardiac electronic device, initial encounter: Secondary | ICD-10-CM

## 2013-01-22 DIAGNOSIS — I428 Other cardiomyopathies: Secondary | ICD-10-CM

## 2013-01-22 DIAGNOSIS — I429 Cardiomyopathy, unspecified: Secondary | ICD-10-CM

## 2013-01-22 DIAGNOSIS — I5022 Chronic systolic (congestive) heart failure: Secondary | ICD-10-CM

## 2013-01-22 DIAGNOSIS — I4891 Unspecified atrial fibrillation: Secondary | ICD-10-CM

## 2013-01-22 LAB — MDC_IDC_ENUM_SESS_TYPE_INCLINIC
Battery Remaining Longevity: 64.8 mo
Brady Statistic RA Percent Paced: 0.01 %
Brady Statistic RV Percent Paced: 53 %
Date Time Interrogation Session: 20141204160947
Lead Channel Impedance Value: 437.5 Ohm
Lead Channel Impedance Value: 475 Ohm
Lead Channel Pacing Threshold Amplitude: 0.75 V
Lead Channel Pacing Threshold Amplitude: 0.75 V
Lead Channel Pacing Threshold Amplitude: 2.75 V
Lead Channel Pacing Threshold Pulse Width: 0.5 ms
Lead Channel Pacing Threshold Pulse Width: 0.8 ms
Lead Channel Pacing Threshold Pulse Width: 0.8 ms
Lead Channel Sensing Intrinsic Amplitude: 12 mV
Lead Channel Setting Pacing Amplitude: 3.5 V
Lead Channel Setting Pacing Amplitude: 3.5 V
Lead Channel Setting Pacing Pulse Width: 0.8 ms
Lead Channel Setting Sensing Sensitivity: 0.5 mV
Zone Setting Detection Interval: 250 ms

## 2013-01-22 NOTE — Progress Notes (Signed)
Wound check appointment. Steri-strips removed. Wound without redness or edema. Incision edges approximated, wound well healed. Normal device function. Thresholds, sensing, and impedances consistent with implant measurements.  LV threshold now 2.75@0 .8.  Patient to have CXR for lead placement.  Device programmed at 3.5V for extra safety margin until 3 month visit. Histogram distribution appropriate for patient and level of activity. No mode switches or ventricular arrhythmias noted. Patient educated about wound care, arm mobility, lifting restrictions, shock plan. ROV in 3 months with implanting physician.

## 2013-01-23 ENCOUNTER — Encounter: Payer: Self-pay | Admitting: *Deleted

## 2013-01-23 ENCOUNTER — Telehealth: Payer: Self-pay | Admitting: *Deleted

## 2013-01-23 NOTE — Telephone Encounter (Signed)
LMOM to return call to schedule device check to check different LV lead vectors. Per GT LV lead looks unchanged. Check different vectors for pacing.

## 2013-01-27 ENCOUNTER — Encounter: Payer: Self-pay | Admitting: Internal Medicine

## 2013-01-27 ENCOUNTER — Ambulatory Visit (INDEPENDENT_AMBULATORY_CARE_PROVIDER_SITE_OTHER): Payer: BC Managed Care – PPO | Admitting: *Deleted

## 2013-01-27 DIAGNOSIS — I428 Other cardiomyopathies: Secondary | ICD-10-CM

## 2013-01-27 LAB — MDC_IDC_ENUM_SESS_TYPE_INCLINIC
Battery Remaining Longevity: 76.8 mo
Brady Statistic RA Percent Paced: 0.01 %
Brady Statistic RV Percent Paced: 45 %
HighPow Impedance: 81 Ohm
Implantable Pulse Generator Serial Number: 7133629
Lead Channel Impedance Value: 450 Ohm
Lead Channel Impedance Value: 462.5 Ohm
Lead Channel Pacing Threshold Amplitude: 1.25 V
Lead Channel Pacing Threshold Pulse Width: 0.8 ms
Lead Channel Sensing Intrinsic Amplitude: 11.7 mV
Lead Channel Setting Pacing Amplitude: 3.5 V
Lead Channel Setting Pacing Amplitude: 3.5 V
Lead Channel Setting Pacing Pulse Width: 0.5 ms
Zone Setting Detection Interval: 300 ms

## 2013-01-27 NOTE — Progress Notes (Signed)
Pt in office today to check different vectors on LV lead. At wound check, LV threshold had increased since implant. All configurations checked with some configurations causing diaphramatic stim. per GT change configuration from M3 - P4 to M2 - RVc with output set at 2.0 V. Follow up as planned.

## 2013-01-28 NOTE — Telephone Encounter (Signed)
Pt scheduled for 01-27-13 @ 900 for device clinic.

## 2013-02-03 ENCOUNTER — Ambulatory Visit (INDEPENDENT_AMBULATORY_CARE_PROVIDER_SITE_OTHER): Payer: BC Managed Care – PPO | Admitting: Pharmacist Clinician (PhC)/ Clinical Pharmacy Specialist

## 2013-02-03 VITALS — BP 112/60 | HR 84

## 2013-02-03 DIAGNOSIS — I4891 Unspecified atrial fibrillation: Secondary | ICD-10-CM

## 2013-02-03 DIAGNOSIS — Z7901 Long term (current) use of anticoagulants: Secondary | ICD-10-CM

## 2013-02-03 LAB — POCT INR: INR: 2.1

## 2013-02-03 MED ORDER — DILTIAZEM HCL 120 MG PO TABS: 60.0000 mg | ORAL_TABLET | Freq: Two times a day (BID) | ORAL | Status: DC

## 2013-02-03 MED ORDER — WARFARIN SODIUM 5 MG PO TABS: ORAL_TABLET | ORAL | Status: DC

## 2013-02-09 ENCOUNTER — Encounter: Payer: Self-pay | Admitting: Internal Medicine

## 2013-03-03 ENCOUNTER — Ambulatory Visit (INDEPENDENT_AMBULATORY_CARE_PROVIDER_SITE_OTHER): Payer: BC Managed Care – PPO | Admitting: Pharmacist Clinician (PhC)/ Clinical Pharmacy Specialist

## 2013-03-03 VITALS — BP 140/72 | HR 68

## 2013-03-03 DIAGNOSIS — I4891 Unspecified atrial fibrillation: Secondary | ICD-10-CM

## 2013-03-03 DIAGNOSIS — Z7901 Long term (current) use of anticoagulants: Secondary | ICD-10-CM

## 2013-03-03 LAB — POCT INR: INR: 2.3

## 2013-03-08 ENCOUNTER — Encounter: Payer: Self-pay | Admitting: Internal Medicine

## 2013-04-09 ENCOUNTER — Encounter: Payer: Self-pay | Admitting: Internal Medicine

## 2013-04-09 ENCOUNTER — Ambulatory Visit (INDEPENDENT_AMBULATORY_CARE_PROVIDER_SITE_OTHER): Payer: BC Managed Care – PPO | Admitting: Internal Medicine

## 2013-04-09 VITALS — BP 130/82 | HR 72 | Ht 70.0 in | Wt 183.0 lb

## 2013-04-09 DIAGNOSIS — I5022 Chronic systolic (congestive) heart failure: Secondary | ICD-10-CM

## 2013-04-09 DIAGNOSIS — I4891 Unspecified atrial fibrillation: Secondary | ICD-10-CM

## 2013-04-09 LAB — MDC_IDC_ENUM_SESS_TYPE_INCLINIC
Battery Remaining Longevity: 85.2 mo
Brady Statistic RV Percent Paced: 44 %
HighPow Impedance: 84.375
Implantable Pulse Generator Serial Number: 7133629
Lead Channel Impedance Value: 425 Ohm
Lead Channel Pacing Threshold Amplitude: 0.75 V
Lead Channel Pacing Threshold Pulse Width: 0.5 ms
Lead Channel Pacing Threshold Pulse Width: 0.8 ms
Lead Channel Sensing Intrinsic Amplitude: 2.9 mV
Lead Channel Sensing Intrinsic Amplitude: 9.6 mV
Lead Channel Setting Pacing Amplitude: 2 V
Lead Channel Setting Pacing Amplitude: 2.5 V
Lead Channel Setting Pacing Amplitude: 3.5 V
Lead Channel Setting Pacing Pulse Width: 0.5 ms
Lead Channel Setting Sensing Sensitivity: 0.5 mV
MDC IDC MSMT LEADCHNL LV IMPEDANCE VALUE: 450 Ohm
MDC IDC MSMT LEADCHNL LV PACING THRESHOLD AMPLITUDE: 0.5 V
MDC IDC MSMT LEADCHNL RA IMPEDANCE VALUE: 475 Ohm
MDC IDC SESS DTM: 20150219105136
MDC IDC SET LEADCHNL LV PACING PULSEWIDTH: 0.8 ms
MDC IDC SET ZONE DETECTION INTERVAL: 250 ms
MDC IDC STAT BRADY RA PERCENT PACED: 0.02 %
Zone Setting Detection Interval: 300 ms

## 2013-04-09 NOTE — Assessment & Plan Note (Signed)
His ventricular rate is not well controlled as he is pacing only about 45-50% of the time. Will follow. He is on 2 AV nodal blocking drugs. Because he feels well, at this time adding additional medications is unlikely to provide any incremental benefit.

## 2013-04-09 NOTE — Patient Instructions (Signed)
Your physician wants you to follow-up in: Nov 2015 with Dr Court Joy will receive a reminder letter in the mail two months in advance. If you don't receive a letter, please call our office to schedule the follow-up appointment.   Remote monitoring is used to monitor your Pacemaker or ICD from home. This monitoring reduces the number of office visits required to check your device to one time per year. It allows [place] to keep an eye on the functioning of your device to ensure it is working properly. You are scheduled for a device check from home on 04/13/13. You may send your transmission at any time that day. If you have a wireless device, the transmission will be sent automatically. After your physician reviews your transmission, you will receive a postcard with your next transmission date.

## 2013-04-09 NOTE — Assessment & Plan Note (Signed)
His symptoms remain class 2A. He will continue his daily activity and maintain a low sodium diet.

## 2013-04-09 NOTE — Progress Notes (Signed)
HPI [name] returns today for followup of his BiV ICD. He is a pleasant 63 yo man with an ICM, chronic systolic CHF, chronic atrial fibrillation who underwent BiV ICD implant several months ago. In the interim, he feels well and has gone back to exercising. He has not had syncope or peripheral edema. Allergies  Allergen Reactions  . Potassium Chloride   . Potassium-Containing Compounds     Kidney failure: Specifically Potassium Chloride     Current Outpatient Prescriptions  Medication Sig Dispense Refill  . aspirin EC 81 MG tablet Take 81 mg by mouth at bedtime.      Marland Kitchen. atorvastatin (LIPITOR) 20 MG tablet Take 1 tablet (20 mg total) by mouth daily at 6 PM.  30 tablet  0  . diltiazem (CARDIZEM) 120 MG tablet Take 0.5 tablets (60 mg total) by mouth 2 (two) times daily.  90 tablet  1  . furosemide (LASIX) 80 MG tablet Take 40 mg by mouth 2 (two) times daily.       Marland Kitchen. glimepiride (AMARYL) 2 MG tablet Take 2 mg by mouth 2 (two) times daily.       Marland Kitchen. glucosamine-chondroitin 500-400 MG tablet Take 3 tablets by mouth daily.      . insulin glargine (LANTUS) 100 UNIT/ML injection Inject 8 Units into the skin daily.      Marland Kitchen. LORazepam (ATIVAN) 1 MG tablet Take 1 mg by mouth at bedtime.      Marland Kitchen. losartan (COZAAR) 50 MG tablet Take 50 mg by mouth daily.       . metoprolol (LOPRESSOR) 25 MG tablet Take 1.5 tablets (37.5 mg total) by mouth 3 (three) times daily.  90 tablet  0  . niacin 500 MG tablet Take 500 mg by mouth 2 (two) times daily.      . Pediatric Multiple Vit-C-FA (CHILDRENS CHEWABLE MULTI VITS PO) Take 2 tablets by mouth daily.      . sodium polystyrene (KAYEXALATE) 15 GM/60ML suspension Take 15 g by mouth every other day.      . vardenafil (LEVITRA) 20 MG tablet Take 20 mg by mouth daily as needed for erectile dysfunction.      Marland Kitchen. warfarin (COUMADIN) 5 MG tablet Take 1 tablet by mouth daily or as directed  90 tablet  1   No current facility-administered medications for this visit.      Past Medical History  Diagnosis Date  . CHF (congestive heart failure)   . Renal disorder   . Type 2 diabetes mellitus   . COPD (chronic obstructive pulmonary disease)   . Hypertension   . PAD (peripheral artery disease) 1997    Bilateral SFA PTA in 1997  . Chronic anticoagulation     on coumadin  . Cardiomyopathy, EF 30-35% by echo 03/2012 05/19/2012    Decreased from 2009 Echo, 40-45%  . CKD (chronic kidney disease) stage 3, GFR 30-59 ml/min     Baseline BUN/Cr 09/2011 - 36/1.44  . Atrial fibrillation, permanent     on warfarin; recurred after DCCV  . LBBB (left bundle branch block)     chronic  . Coronary artery disease     s/p CABG 1998; Myoview 2008 mild septal ischemia -no cath,  . Peripheral neuropathy     ROS:   All systems reviewed and negative except as noted in the HPI.   Past Surgical History  Procedure Laterality Date  . Peripheral vascular intervention  02/18/1996    Bilateral SFA PTA  .  Coronary artery bypass graft  03/02/1996    x7, LIMA-LAD, SVG-OM1, SVG-OM2, SVG-OM3, SVG-PDA, SVG-PLA  . Peripheral vascular intervention  06/30/1996    R SFA-high grade 95% focal stenosis crossed w/ a 5x31mm Cordis "Opti-5" balloon dilated at 5atm-56sec, 7atm-90sec, and 5atm-120sec  . Bi-ventricular implantable cardioverter defibrillator  (crt-d)  01-08-2013    STJ CRTD implanted by Dr Ladona Ridgel for ischemic cardiomyopathy, CHF, and LBBB     Family History  Problem Relation Age of Onset  . CAD Mother   . Diabetes Mother   . CAD Father   . Diabetes Father   . CAD Brother      History   Social History  . Marital Status: Married    Spouse Name: N/A    Number of Children: N/A  . Years of Education: N/A   Occupational History  . Not on file.   Social History Main Topics  . Smoking status: Former Smoker    Quit date: 05/20/1995  . Smokeless tobacco: Never Used  . Alcohol Use: Yes     Comment: 2 drinks/week  . Drug Use: No     Comment: quit in 1972  .  Sexual Activity: Not on file   Other Topics Concern  . Not on file   Social History Narrative  . No narrative on file     BP 130/82  Pulse 72  Ht 5\' 10"  (1.778 m)  Wt 183 lb (83.008 kg)  BMI 26.26 kg/m2  Physical Exam:  Well appearing middle aged man, NAD HEENT: Unremarkable Neck:  No JVD, no thyromegally Back:  No CVA tenderness Lungs:  Clear with no wheezes HEART:  Regular rate rhythm, no murmurs, no rubs, no clicks Abd:  soft, positive bowel sounds, no organomegally, no rebound, no guarding Ext:  2 plus pulses, no edema, no cyanosis, no clubbing Skin:  No rashes no nodules Neuro:  CN II through XII intact, motor grossly intact  DEVICE  Normal device function.  See PaceArt for details.   Assess/Plan:

## 2013-04-14 ENCOUNTER — Ambulatory Visit: Payer: BC Managed Care – PPO | Admitting: Pharmacist Clinician (PhC)/ Clinical Pharmacy Specialist

## 2013-04-23 ENCOUNTER — Ambulatory Visit (INDEPENDENT_AMBULATORY_CARE_PROVIDER_SITE_OTHER): Payer: BC Managed Care – PPO | Admitting: Pharmacist Clinician (PhC)/ Clinical Pharmacy Specialist

## 2013-04-23 VITALS — BP 124/72 | HR 80

## 2013-04-23 DIAGNOSIS — I4891 Unspecified atrial fibrillation: Secondary | ICD-10-CM

## 2013-04-23 DIAGNOSIS — Z7901 Long term (current) use of anticoagulants: Secondary | ICD-10-CM

## 2013-04-23 LAB — POCT INR: INR: 2.1

## 2013-05-13 ENCOUNTER — Other Ambulatory Visit: Payer: Self-pay

## 2013-05-13 MED ORDER — METOPROLOL TARTRATE 25 MG PO TABS: 37.5000 mg | ORAL_TABLET | Freq: Three times a day (TID) | ORAL | Status: DC

## 2013-05-13 NOTE — Telephone Encounter (Signed)
Rx was sent to pharmacy electronically. 

## 2013-06-05 ENCOUNTER — Ambulatory Visit: Payer: BC Managed Care – PPO | Admitting: Pharmacist Clinician (PhC)/ Clinical Pharmacy Specialist

## 2013-06-09 ENCOUNTER — Ambulatory Visit (INDEPENDENT_AMBULATORY_CARE_PROVIDER_SITE_OTHER): Payer: BC Managed Care – PPO | Admitting: Pharmacist Clinician (PhC)/ Clinical Pharmacy Specialist

## 2013-06-09 VITALS — BP 140/76 | HR 76

## 2013-06-09 DIAGNOSIS — Z7901 Long term (current) use of anticoagulants: Secondary | ICD-10-CM

## 2013-06-09 DIAGNOSIS — I4891 Unspecified atrial fibrillation: Secondary | ICD-10-CM

## 2013-06-09 LAB — POCT INR: INR: 2.7

## 2013-06-22 ENCOUNTER — Other Ambulatory Visit: Payer: Self-pay | Admitting: Cardiovascular Disease

## 2013-07-14 ENCOUNTER — Ambulatory Visit (INDEPENDENT_AMBULATORY_CARE_PROVIDER_SITE_OTHER): Payer: BC Managed Care – PPO | Admitting: *Deleted

## 2013-07-14 ENCOUNTER — Encounter: Payer: Self-pay | Admitting: Internal Medicine

## 2013-07-14 DIAGNOSIS — I429 Cardiomyopathy, unspecified: Secondary | ICD-10-CM

## 2013-07-14 DIAGNOSIS — I428 Other cardiomyopathies: Secondary | ICD-10-CM

## 2013-07-14 NOTE — Progress Notes (Signed)
Remote ICD transmission.   

## 2013-07-21 LAB — MDC_IDC_ENUM_SESS_TYPE_REMOTE
Brady Statistic AP VP Percent: 0 %
Brady Statistic AP VS Percent: 0 %
Brady Statistic AS VP Percent: 0 %
Brady Statistic AS VS Percent: 0 %
Brady Statistic RA Percent Paced: 1 %
Date Time Interrogation Session: 20150526104555
HighPow Impedance: 87 Ohm
HighPow Impedance: 87 Ohm
Lead Channel Impedance Value: 510 Ohm
Lead Channel Pacing Threshold Amplitude: 0.5 V
Lead Channel Pacing Threshold Amplitude: 0.75 V
Lead Channel Pacing Threshold Pulse Width: 0.5 ms
Lead Channel Pacing Threshold Pulse Width: 0.8 ms
Lead Channel Sensing Intrinsic Amplitude: 11.7 mV
Lead Channel Setting Pacing Amplitude: 2 V
Lead Channel Setting Pacing Amplitude: 3.5 V
Lead Channel Setting Pacing Pulse Width: 0.8 ms
Lead Channel Setting Sensing Sensitivity: 0.5 mV
MDC IDC MSMT BATTERY REMAINING LONGEVITY: 72 mo
MDC IDC MSMT BATTERY REMAINING PERCENTAGE: 90 %
MDC IDC MSMT BATTERY VOLTAGE: 3.13 V
MDC IDC MSMT LEADCHNL RA IMPEDANCE VALUE: 460 Ohm
MDC IDC MSMT LEADCHNL RA SENSING INTR AMPL: 2.9 mV
MDC IDC MSMT LEADCHNL RV IMPEDANCE VALUE: 440 Ohm
MDC IDC PG SERIAL: 7133629
MDC IDC SET LEADCHNL RV PACING AMPLITUDE: 2.5 V
MDC IDC SET LEADCHNL RV PACING PULSEWIDTH: 0.5 ms
MDC IDC SET ZONE DETECTION INTERVAL: 250 ms
Zone Setting Detection Interval: 300 ms

## 2013-07-25 ENCOUNTER — Other Ambulatory Visit: Payer: Self-pay | Admitting: Cardiovascular Disease

## 2013-07-27 ENCOUNTER — Ambulatory Visit (INDEPENDENT_AMBULATORY_CARE_PROVIDER_SITE_OTHER): Payer: BC Managed Care – PPO | Admitting: Pharmacist Clinician (PhC)/ Clinical Pharmacy Specialist

## 2013-07-27 DIAGNOSIS — I4891 Unspecified atrial fibrillation: Secondary | ICD-10-CM

## 2013-07-27 DIAGNOSIS — Z7901 Long term (current) use of anticoagulants: Secondary | ICD-10-CM

## 2013-07-27 LAB — POCT INR: INR: 3.3

## 2013-07-27 NOTE — Telephone Encounter (Signed)
Rx was sent to pharmacy electronically. 

## 2013-07-31 ENCOUNTER — Encounter: Payer: Self-pay | Admitting: Cardiology

## 2013-08-26 ENCOUNTER — Encounter: Payer: Self-pay | Admitting: Cardiovascular Disease

## 2013-08-27 ENCOUNTER — Encounter: Payer: Self-pay | Admitting: Cardiovascular Disease

## 2013-09-02 ENCOUNTER — Encounter: Payer: Self-pay | Admitting: Cardiovascular Disease

## 2013-09-07 ENCOUNTER — Ambulatory Visit: Payer: BC Managed Care – PPO | Admitting: Pharmacist Clinician (PhC)/ Clinical Pharmacy Specialist

## 2013-09-09 ENCOUNTER — Other Ambulatory Visit: Payer: Self-pay

## 2013-09-09 ENCOUNTER — Ambulatory Visit (INDEPENDENT_AMBULATORY_CARE_PROVIDER_SITE_OTHER): Payer: BC Managed Care – PPO | Admitting: Pharmacist Clinician (PhC)/ Clinical Pharmacy Specialist

## 2013-09-09 DIAGNOSIS — I4891 Unspecified atrial fibrillation: Secondary | ICD-10-CM

## 2013-09-09 DIAGNOSIS — Z7901 Long term (current) use of anticoagulants: Secondary | ICD-10-CM

## 2013-09-09 LAB — POCT INR: INR: 3.7

## 2013-09-10 ENCOUNTER — Telehealth: Payer: Self-pay

## 2013-09-10 MED ORDER — METOPROLOL TARTRATE 50 MG PO TABS: 50.0000 mg | ORAL_TABLET | Freq: Three times a day (TID) | ORAL | Status: DC

## 2013-09-10 MED ORDER — DILTIAZEM HCL 120 MG PO TABS: 60.0000 mg | ORAL_TABLET | Freq: Two times a day (BID) | ORAL | Status: DC

## 2013-09-10 NOTE — Telephone Encounter (Signed)
Pharmacy called to clarify instructions for Metoprolol. Per patient, he takes 0.5 tablet of Metoprolol 50 AND 0.5 tablet of Metoprolol 25 to take a total of 37.5 mg three times daily.  Called this information back to the pharmacy to clarify instructions.

## 2013-09-10 NOTE — Telephone Encounter (Signed)
Rx was sent to pharmacy electronically. 

## 2013-09-14 ENCOUNTER — Other Ambulatory Visit: Payer: Self-pay | Admitting: Pharmacist Clinician (PhC)/ Clinical Pharmacy Specialist

## 2013-09-14 MED ORDER — WARFARIN SODIUM 5 MG PO TABS: ORAL_TABLET | ORAL | Status: DC

## 2013-09-14 MED ORDER — WARFARIN SODIUM 7.5 MG PO TABS: ORAL_TABLET | ORAL | Status: DC

## 2013-09-30 ENCOUNTER — Ambulatory Visit: Payer: BC Managed Care – PPO | Admitting: Pharmacist Clinician (PhC)/ Clinical Pharmacy Specialist

## 2013-09-30 ENCOUNTER — Ambulatory Visit (INDEPENDENT_AMBULATORY_CARE_PROVIDER_SITE_OTHER): Payer: BC Managed Care – PPO | Admitting: Pharmacist Clinician (PhC)/ Clinical Pharmacy Specialist

## 2013-09-30 DIAGNOSIS — Z7901 Long term (current) use of anticoagulants: Secondary | ICD-10-CM

## 2013-09-30 DIAGNOSIS — I4891 Unspecified atrial fibrillation: Secondary | ICD-10-CM

## 2013-09-30 LAB — POCT INR: INR: 2

## 2013-10-19 ENCOUNTER — Telehealth: Payer: Self-pay | Admitting: Cardiology

## 2013-10-19 ENCOUNTER — Ambulatory Visit (INDEPENDENT_AMBULATORY_CARE_PROVIDER_SITE_OTHER): Payer: BC Managed Care – PPO | Admitting: *Deleted

## 2013-10-19 ENCOUNTER — Encounter: Payer: Self-pay | Admitting: Internal Medicine

## 2013-10-19 DIAGNOSIS — I429 Cardiomyopathy, unspecified: Secondary | ICD-10-CM

## 2013-10-19 DIAGNOSIS — I428 Other cardiomyopathies: Secondary | ICD-10-CM

## 2013-10-19 LAB — MDC_IDC_ENUM_SESS_TYPE_REMOTE
Battery Remaining Longevity: 70 mo
Battery Remaining Percentage: 87 %
Battery Voltage: 3.05 V
Brady Statistic AP VP Percent: 0 %
Brady Statistic AS VP Percent: 0 %
Brady Statistic AS VS Percent: 0 %
HIGH POWER IMPEDANCE MEASURED VALUE: 84 Ohm
HIGH POWER IMPEDANCE MEASURED VALUE: 84 Ohm
Implantable Pulse Generator Serial Number: 7133629
Lead Channel Impedance Value: 450 Ohm
Lead Channel Pacing Threshold Amplitude: 0.75 V
Lead Channel Pacing Threshold Pulse Width: 0.5 ms
Lead Channel Pacing Threshold Pulse Width: 0.8 ms
Lead Channel Sensing Intrinsic Amplitude: 2.9 mV
Lead Channel Setting Pacing Pulse Width: 0.5 ms
Lead Channel Setting Pacing Pulse Width: 0.8 ms
Lead Channel Setting Sensing Sensitivity: 0.5 mV
MDC IDC MSMT LEADCHNL LV IMPEDANCE VALUE: 560 Ohm
MDC IDC MSMT LEADCHNL LV PACING THRESHOLD AMPLITUDE: 0.5 V
MDC IDC MSMT LEADCHNL RA IMPEDANCE VALUE: 460 Ohm
MDC IDC MSMT LEADCHNL RV SENSING INTR AMPL: 11.7 mV
MDC IDC SESS DTM: 20150831161823
MDC IDC SET LEADCHNL LV PACING AMPLITUDE: 2 V
MDC IDC SET LEADCHNL RA PACING AMPLITUDE: 3.5 V
MDC IDC SET LEADCHNL RV PACING AMPLITUDE: 2.5 V
MDC IDC STAT BRADY AP VS PERCENT: 0 %
MDC IDC STAT BRADY RA PERCENT PACED: 1 %
Zone Setting Detection Interval: 250 ms
Zone Setting Detection Interval: 300 ms

## 2013-10-19 NOTE — Telephone Encounter (Signed)
Spoke with pt and reminded pt of remote transmission that is due today. Pt verbalized understanding.   

## 2013-10-19 NOTE — Progress Notes (Signed)
Remote ICD transmission.   

## 2013-10-28 ENCOUNTER — Ambulatory Visit (INDEPENDENT_AMBULATORY_CARE_PROVIDER_SITE_OTHER): Payer: BC Managed Care – PPO | Admitting: Pharmacist Clinician (PhC)/ Clinical Pharmacy Specialist

## 2013-10-28 DIAGNOSIS — Z7901 Long term (current) use of anticoagulants: Secondary | ICD-10-CM

## 2013-10-28 DIAGNOSIS — I4891 Unspecified atrial fibrillation: Secondary | ICD-10-CM

## 2013-10-28 LAB — POCT INR: INR: 1.7

## 2013-10-28 MED ORDER — DILTIAZEM HCL 120 MG PO TABS: 60.0000 mg | ORAL_TABLET | Freq: Two times a day (BID) | ORAL | Status: DC

## 2013-10-29 ENCOUNTER — Encounter: Payer: Self-pay | Admitting: Cardiology

## 2013-11-18 ENCOUNTER — Ambulatory Visit: Payer: BC Managed Care – PPO | Admitting: Pharmacist Clinician (PhC)/ Clinical Pharmacy Specialist

## 2013-11-20 ENCOUNTER — Ambulatory Visit (INDEPENDENT_AMBULATORY_CARE_PROVIDER_SITE_OTHER): Payer: BC Managed Care – PPO | Admitting: Pharmacist Clinician (PhC)/ Clinical Pharmacy Specialist

## 2013-11-20 DIAGNOSIS — Z7901 Long term (current) use of anticoagulants: Secondary | ICD-10-CM

## 2013-11-20 DIAGNOSIS — I4891 Unspecified atrial fibrillation: Secondary | ICD-10-CM

## 2013-11-20 LAB — POCT INR: INR: 2.9

## 2013-12-04 ENCOUNTER — Other Ambulatory Visit: Payer: Self-pay

## 2013-12-16 ENCOUNTER — Ambulatory Visit (INDEPENDENT_AMBULATORY_CARE_PROVIDER_SITE_OTHER): Payer: BC Managed Care – PPO | Admitting: Pharmacist Clinician (PhC)/ Clinical Pharmacy Specialist

## 2013-12-16 DIAGNOSIS — Z7901 Long term (current) use of anticoagulants: Secondary | ICD-10-CM

## 2013-12-16 DIAGNOSIS — I4891 Unspecified atrial fibrillation: Secondary | ICD-10-CM

## 2013-12-16 LAB — POCT INR
INR: 2.1
INR: 3.4

## 2014-01-05 ENCOUNTER — Ambulatory Visit (INDEPENDENT_AMBULATORY_CARE_PROVIDER_SITE_OTHER): Payer: BC Managed Care – PPO | Admitting: Internal Medicine

## 2014-01-05 ENCOUNTER — Encounter: Payer: Self-pay | Admitting: Internal Medicine

## 2014-01-05 VITALS — BP 140/80 | HR 81 | Ht 70.0 in | Wt 181.2 lb

## 2014-01-05 DIAGNOSIS — I482 Chronic atrial fibrillation, unspecified: Secondary | ICD-10-CM

## 2014-01-05 DIAGNOSIS — I429 Cardiomyopathy, unspecified: Secondary | ICD-10-CM

## 2014-01-05 DIAGNOSIS — I10 Essential (primary) hypertension: Secondary | ICD-10-CM

## 2014-01-05 DIAGNOSIS — I5022 Chronic systolic (congestive) heart failure: Secondary | ICD-10-CM

## 2014-01-05 DIAGNOSIS — I447 Left bundle-branch block, unspecified: Secondary | ICD-10-CM

## 2014-01-05 DIAGNOSIS — I5043 Acute on chronic combined systolic (congestive) and diastolic (congestive) heart failure: Secondary | ICD-10-CM

## 2014-01-05 LAB — MDC_IDC_ENUM_SESS_TYPE_INCLINIC
Battery Remaining Longevity: 75.6 mo
Brady Statistic RA Percent Paced: 0.02 %
HighPow Impedance: 91.125
Lead Channel Impedance Value: 437.5 Ohm
Lead Channel Impedance Value: 487.5 Ohm
Lead Channel Pacing Threshold Amplitude: 0.75 V
Lead Channel Pacing Threshold Amplitude: 1.25 V
Lead Channel Pacing Threshold Pulse Width: 0.5 ms
Lead Channel Sensing Intrinsic Amplitude: 11.7 mV
Lead Channel Setting Pacing Amplitude: 2.5 V
Lead Channel Setting Pacing Amplitude: 2.5 V
Lead Channel Setting Pacing Pulse Width: 0.5 ms
Lead Channel Setting Pacing Pulse Width: 0.8 ms
MDC IDC MSMT LEADCHNL LV PACING THRESHOLD PULSEWIDTH: 0.8 ms
MDC IDC MSMT LEADCHNL RA IMPEDANCE VALUE: 462.5 Ohm
MDC IDC MSMT LEADCHNL RA SENSING INTR AMPL: 1.2 mV
MDC IDC PG SERIAL: 7133629
MDC IDC SESS DTM: 20151117155003
MDC IDC SET LEADCHNL RV SENSING SENSITIVITY: 0.5 mV
MDC IDC STAT BRADY RV PERCENT PACED: 46 %
Zone Setting Detection Interval: 250 ms
Zone Setting Detection Interval: 300 ms

## 2014-01-05 NOTE — Patient Instructions (Signed)
Your physician wants you to follow-up in: 12 month with Dr. Ladona Ridgel. You will receive a reminder letter in the mail two months in advance. If you don't receive a letter, please call our office to schedule the follow-up appointment.   Remote monitoring is used to monitor your Pacemaker of ICD from home. This monitoring reduces the number of office visits required to check your device to one time per year. It allows [place] to keep an eye on the functioning of your device to ensure it is working properly. You are scheduled for a device check from home on 04/07/2014. You may send your transmission at any time that day. If you have a wireless device, the transmission will be sent automatically. After your physician reviews your transmission, you will receive a postcard with your next transmission date.

## 2014-01-05 NOTE — Progress Notes (Signed)
HPI [name] returns today for followup of his BiV ICD. He is a pleasant 63 yo man with an ICM, chronic systolic CHF, chronic atrial fibrillation who underwent BiV ICD implant 12 months ago. In the interim, he feels well and has gone back to exercising. He has not had syncope or peripheral edema. No palpitations or ICD shock. Allergies  Allergen Reactions  . Potassium Chloride Other (See Comments)  . Potassium-Containing Compounds     Kidney failure: Specifically Potassium Chloride     Current Outpatient Prescriptions  Medication Sig Dispense Refill  . aspirin EC 81 MG tablet Take 81 mg by mouth at bedtime.    Marland Kitchen. atorvastatin (LIPITOR) 20 MG tablet Take 1 tablet (20 mg total) by mouth daily at 6 PM. 30 tablet 0  . calcitRIOL (ROCALTROL) 0.25 MCG capsule Take 0.25 mcg by mouth every other day.    . diltiazem (CARDIZEM) 120 MG tablet Take 0.5 tablets (60 mg total) by mouth 2 (two) times daily. 90 tablet 2  . furosemide (LASIX) 80 MG tablet Take 40 mg by mouth 2 (two) times daily.     Marland Kitchen. glimepiride (AMARYL) 2 MG tablet Take 2 mg by mouth 2 (two) times daily.     Marland Kitchen. glucosamine-chondroitin 500-400 MG tablet Take 3 tablets by mouth daily.    . insulin glargine (LANTUS) 100 UNIT/ML injection Inject 8 Units into the skin daily.    Marland Kitchen. LORazepam (ATIVAN) 1 MG tablet Take 1 mg by mouth at bedtime.    Marland Kitchen. losartan (COZAAR) 50 MG tablet Take 50 mg by mouth daily.     . metoprolol (LOPRESSOR) 50 MG tablet Take 25 mg by mouth 2 (two) times daily.     . metoprolol tartrate (LOPRESSOR) 25 MG tablet Take 12.5 mg by mouth 2 (two) times daily.     . niacin 500 MG tablet Take 500 mg by mouth 2 (two) times daily.    . Pediatric Multiple Vit-C-FA (CHILDRENS CHEWABLE MULTI VITS PO) Take 2 tablets by mouth daily.    . sodium polystyrene (KAYEXALATE) 15 GM/60ML suspension Take 15 g by mouth every other day.    . vardenafil (LEVITRA) 20 MG tablet Take 20 mg by mouth daily as needed for erectile dysfunction.      Marland Kitchen. warfarin (COUMADIN) 5 MG tablet Take 1 tablet by mouth 4-6 times per week as directed by coumadin clinic 72 tablet 1  . warfarin (COUMADIN) 7.5 MG tablet Take 1-3 tablets by mouth weekly as directed by coumadin clinic 36 tablet 1   No current facility-administered medications for this visit.     Past Medical History  Diagnosis Date  . CHF (congestive heart failure)   . Renal disorder   . Type 2 diabetes mellitus   . COPD (chronic obstructive pulmonary disease)   . Hypertension   . PAD (peripheral artery disease) 1997    Bilateral SFA PTA in 1997  . Chronic anticoagulation     on coumadin  . Cardiomyopathy, EF 30-35% by echo 03/2012 05/19/2012    Decreased from 2009 Echo, 40-45%  . CKD (chronic kidney disease) stage 3, GFR 30-59 ml/min     Baseline BUN/Cr 09/2011 - 36/1.44  . Atrial fibrillation, permanent     on warfarin; recurred after DCCV  . LBBB (left bundle branch block)     chronic  . Coronary artery disease     s/p CABG 1998; Myoview 2008 mild septal ischemia -no cath,  . Peripheral neuropathy  ROS:   All systems reviewed and negative except as noted in the HPI.   Past Surgical History  Procedure Laterality Date  . Peripheral vascular intervention  02/18/1996    Bilateral SFA PTA  . Coronary artery bypass graft  03/02/1996    x7, LIMA-LAD, SVG-OM1, SVG-OM2, SVG-OM3, SVG-PDA, SVG-PLA  . Peripheral vascular intervention  06/30/1996    R SFA-high grade 95% focal stenosis crossed w/ a 5x84mm Cordis "Opti-5" balloon dilated at 5atm-56sec, 7atm-90sec, and 5atm-120sec  . Bi-ventricular implantable cardioverter defibrillator  (crt-d)  01-08-2013    STJ CRTD implanted by Dr Maxwell Fitzgerald for ischemic cardiomyopathy, CHF, and LBBB     Family History  Problem Relation Age of Onset  . CAD Mother   . Diabetes Mother   . CAD Father   . Diabetes Father   . CAD Brother      History   Social History  . Marital Status: Married    Spouse Name: N/A    Number of Children:  N/A  . Years of Education: N/A   Occupational History  . Not on file.   Social History Main Topics  . Smoking status: Former Smoker    Quit date: 05/20/1995  . Smokeless tobacco: Never Used  . Alcohol Use: Yes     Comment: 2 drinks/week  . Drug Use: No     Comment: quit in 1972  . Sexual Activity: Not on file   Other Topics Concern  . Not on file   Social History Narrative     BP 140/80 mmHg  Pulse 81  Ht 5\' 10"  (1.778 m)  Wt 181 lb 3.2 oz (82.192 kg)  BMI 26.00 kg/m2  Physical Exam:  Well appearing middle aged man, NAD HEENT: Unremarkable Neck:  No JVD, no thyromegally Back:  No CVA tenderness Lungs:  Clear with no wheezes HEART:  Regular rate rhythm, no murmurs, no rubs, no clicks Abd:  soft, positive bowel sounds, no organomegally, no rebound, no guarding Ext:  2 plus pulses, no edema, no cyanosis, no clubbing Skin:  No rashes no nodules Neuro:  CN II through XII intact, motor grossly intact  DEVICE  Normal device function.  See PaceArt for details. Increased his ventricular rate to 70/min.  Assess/Plan:

## 2014-01-05 NOTE — Assessment & Plan Note (Signed)
His ventricular rate is reasonably well controlled. Will follow.

## 2014-01-05 NOTE — Assessment & Plan Note (Signed)
His blood pressure is well controlled. No change in meds. He will maintain a low sodium diet. 

## 2014-01-05 NOTE — Assessment & Plan Note (Signed)
His symptoms are well controlled. No change in meds. 

## 2014-01-06 ENCOUNTER — Encounter: Payer: Self-pay | Admitting: Cardiovascular Disease

## 2014-01-11 ENCOUNTER — Ambulatory Visit (INDEPENDENT_AMBULATORY_CARE_PROVIDER_SITE_OTHER): Payer: BC Managed Care – PPO | Admitting: Pharmacist Clinician (PhC)/ Clinical Pharmacy Specialist

## 2014-01-11 DIAGNOSIS — I4891 Unspecified atrial fibrillation: Secondary | ICD-10-CM

## 2014-01-11 DIAGNOSIS — Z7901 Long term (current) use of anticoagulants: Secondary | ICD-10-CM

## 2014-01-11 LAB — POCT INR: INR: 3.4

## 2014-01-12 ENCOUNTER — Encounter: Payer: Self-pay | Admitting: Internal Medicine

## 2014-01-25 ENCOUNTER — Ambulatory Visit (INDEPENDENT_AMBULATORY_CARE_PROVIDER_SITE_OTHER): Payer: BC Managed Care – PPO | Admitting: Pharmacist Clinician (PhC)/ Clinical Pharmacy Specialist

## 2014-01-25 DIAGNOSIS — Z7901 Long term (current) use of anticoagulants: Secondary | ICD-10-CM

## 2014-01-25 DIAGNOSIS — I4891 Unspecified atrial fibrillation: Secondary | ICD-10-CM

## 2014-01-25 LAB — POCT INR: INR: 4.3

## 2014-01-28 ENCOUNTER — Encounter (HOSPITAL_COMMUNITY): Payer: Self-pay | Admitting: Internal Medicine

## 2014-02-08 ENCOUNTER — Ambulatory Visit: Payer: BC Managed Care – PPO | Admitting: Pharmacist Clinician (PhC)/ Clinical Pharmacy Specialist

## 2014-02-10 ENCOUNTER — Ambulatory Visit (INDEPENDENT_AMBULATORY_CARE_PROVIDER_SITE_OTHER): Payer: BC Managed Care – PPO | Admitting: Pharmacist Clinician (PhC)/ Clinical Pharmacy Specialist

## 2014-02-10 DIAGNOSIS — Z7901 Long term (current) use of anticoagulants: Secondary | ICD-10-CM

## 2014-02-10 DIAGNOSIS — I4891 Unspecified atrial fibrillation: Secondary | ICD-10-CM

## 2014-02-10 LAB — POCT INR: INR: 1.3

## 2014-02-22 ENCOUNTER — Ambulatory Visit (INDEPENDENT_AMBULATORY_CARE_PROVIDER_SITE_OTHER): Payer: Medicare Other | Admitting: Pharmacist Clinician (PhC)/ Clinical Pharmacy Specialist

## 2014-02-22 DIAGNOSIS — I4891 Unspecified atrial fibrillation: Secondary | ICD-10-CM | POA: Diagnosis not present

## 2014-02-22 DIAGNOSIS — Z7901 Long term (current) use of anticoagulants: Secondary | ICD-10-CM | POA: Diagnosis not present

## 2014-02-22 LAB — POCT INR: INR: 2

## 2014-03-11 DIAGNOSIS — M1711 Unilateral primary osteoarthritis, right knee: Secondary | ICD-10-CM | POA: Diagnosis not present

## 2014-03-18 DIAGNOSIS — M1711 Unilateral primary osteoarthritis, right knee: Secondary | ICD-10-CM | POA: Diagnosis not present

## 2014-03-18 DIAGNOSIS — M25561 Pain in right knee: Secondary | ICD-10-CM | POA: Diagnosis not present

## 2014-03-22 ENCOUNTER — Ambulatory Visit (INDEPENDENT_AMBULATORY_CARE_PROVIDER_SITE_OTHER): Payer: Medicare Other | Admitting: Pharmacist Clinician (PhC)/ Clinical Pharmacy Specialist

## 2014-03-22 DIAGNOSIS — Z7901 Long term (current) use of anticoagulants: Secondary | ICD-10-CM

## 2014-03-22 DIAGNOSIS — I4891 Unspecified atrial fibrillation: Secondary | ICD-10-CM | POA: Diagnosis not present

## 2014-03-22 LAB — POCT INR: INR: 2.3

## 2014-03-29 DIAGNOSIS — M1711 Unilateral primary osteoarthritis, right knee: Secondary | ICD-10-CM | POA: Diagnosis not present

## 2014-03-31 DIAGNOSIS — N2581 Secondary hyperparathyroidism of renal origin: Secondary | ICD-10-CM | POA: Diagnosis not present

## 2014-03-31 DIAGNOSIS — N183 Chronic kidney disease, stage 3 (moderate): Secondary | ICD-10-CM | POA: Diagnosis not present

## 2014-03-31 DIAGNOSIS — I129 Hypertensive chronic kidney disease with stage 1 through stage 4 chronic kidney disease, or unspecified chronic kidney disease: Secondary | ICD-10-CM | POA: Diagnosis not present

## 2014-03-31 DIAGNOSIS — E0822 Diabetes mellitus due to underlying condition with diabetic chronic kidney disease: Secondary | ICD-10-CM | POA: Diagnosis not present

## 2014-03-31 DIAGNOSIS — M109 Gout, unspecified: Secondary | ICD-10-CM | POA: Diagnosis not present

## 2014-04-06 ENCOUNTER — Other Ambulatory Visit: Payer: Self-pay | Admitting: Cardiovascular Disease

## 2014-04-07 ENCOUNTER — Ambulatory Visit (INDEPENDENT_AMBULATORY_CARE_PROVIDER_SITE_OTHER): Payer: Medicare Other | Admitting: *Deleted

## 2014-04-07 ENCOUNTER — Encounter: Payer: Self-pay | Admitting: Internal Medicine

## 2014-04-07 DIAGNOSIS — I255 Ischemic cardiomyopathy: Secondary | ICD-10-CM

## 2014-04-07 LAB — MDC_IDC_ENUM_SESS_TYPE_REMOTE
Battery Remaining Longevity: 71 mo
Battery Voltage: 3.01 V
Date Time Interrogation Session: 20160217110346
HIGH POWER IMPEDANCE MEASURED VALUE: 93 Ohm
HighPow Impedance: 93 Ohm
Implantable Pulse Generator Serial Number: 7133629
Lead Channel Impedance Value: 440 Ohm
Lead Channel Impedance Value: 480 Ohm
Lead Channel Pacing Threshold Amplitude: 0.75 V
Lead Channel Pacing Threshold Amplitude: 1.25 V
Lead Channel Pacing Threshold Pulse Width: 0.8 ms
Lead Channel Sensing Intrinsic Amplitude: 1.2 mV
Lead Channel Setting Pacing Amplitude: 2.5 V
Lead Channel Setting Pacing Pulse Width: 0.8 ms
Lead Channel Setting Sensing Sensitivity: 0.5 mV
MDC IDC MSMT BATTERY REMAINING PERCENTAGE: 81 %
MDC IDC MSMT LEADCHNL LV IMPEDANCE VALUE: 480 Ohm
MDC IDC MSMT LEADCHNL RV PACING THRESHOLD PULSEWIDTH: 0.5 ms
MDC IDC MSMT LEADCHNL RV SENSING INTR AMPL: 5.2 mV
MDC IDC SET LEADCHNL RV PACING AMPLITUDE: 2.5 V
MDC IDC SET LEADCHNL RV PACING PULSEWIDTH: 0.5 ms
MDC IDC SET ZONE DETECTION INTERVAL: 250 ms
Zone Setting Detection Interval: 300 ms

## 2014-04-07 NOTE — Progress Notes (Signed)
Remote ICD transmission.   

## 2014-04-09 NOTE — Telephone Encounter (Signed)
Rx refill sent to patient pharmacy  Spoke with patient to confirm dose. Patient take one 50mg  tablet and one 25mg  tablet BID

## 2014-04-16 ENCOUNTER — Encounter: Payer: Self-pay | Admitting: Cardiology

## 2014-04-21 ENCOUNTER — Ambulatory Visit (INDEPENDENT_AMBULATORY_CARE_PROVIDER_SITE_OTHER): Payer: Medicare Other | Admitting: Pharmacist Clinician (PhC)/ Clinical Pharmacy Specialist

## 2014-04-21 DIAGNOSIS — Z7901 Long term (current) use of anticoagulants: Secondary | ICD-10-CM | POA: Diagnosis not present

## 2014-04-21 DIAGNOSIS — I4891 Unspecified atrial fibrillation: Secondary | ICD-10-CM | POA: Diagnosis not present

## 2014-04-21 LAB — POCT INR: INR: 2.1

## 2014-04-25 ENCOUNTER — Other Ambulatory Visit: Payer: Self-pay | Admitting: Cardiovascular Disease

## 2014-04-27 ENCOUNTER — Encounter: Payer: Self-pay | Admitting: Cardiovascular Disease

## 2014-04-28 ENCOUNTER — Other Ambulatory Visit: Payer: Self-pay | Admitting: Pharmacist Clinician (PhC)/ Clinical Pharmacy Specialist

## 2014-05-12 DIAGNOSIS — M25561 Pain in right knee: Secondary | ICD-10-CM | POA: Diagnosis not present

## 2014-05-12 DIAGNOSIS — M1711 Unilateral primary osteoarthritis, right knee: Secondary | ICD-10-CM | POA: Diagnosis not present

## 2014-05-27 DIAGNOSIS — H43813 Vitreous degeneration, bilateral: Secondary | ICD-10-CM | POA: Diagnosis not present

## 2014-05-27 DIAGNOSIS — E11329 Type 2 diabetes mellitus with mild nonproliferative diabetic retinopathy without macular edema: Secondary | ICD-10-CM | POA: Diagnosis not present

## 2014-06-02 ENCOUNTER — Encounter: Payer: Self-pay | Admitting: Cardiovascular Disease

## 2014-06-02 ENCOUNTER — Ambulatory Visit (INDEPENDENT_AMBULATORY_CARE_PROVIDER_SITE_OTHER): Payer: Medicare Other | Admitting: Cardiovascular Disease

## 2014-06-02 ENCOUNTER — Ambulatory Visit (INDEPENDENT_AMBULATORY_CARE_PROVIDER_SITE_OTHER): Payer: Medicare Other | Admitting: Pharmacist Clinician (PhC)/ Clinical Pharmacy Specialist

## 2014-06-02 VITALS — BP 104/62 | HR 79 | Ht 70.0 in | Wt 177.4 lb

## 2014-06-02 DIAGNOSIS — I10 Essential (primary) hypertension: Secondary | ICD-10-CM

## 2014-06-02 DIAGNOSIS — I519 Heart disease, unspecified: Secondary | ICD-10-CM | POA: Diagnosis not present

## 2014-06-02 DIAGNOSIS — I482 Chronic atrial fibrillation, unspecified: Secondary | ICD-10-CM

## 2014-06-02 DIAGNOSIS — I2583 Coronary atherosclerosis due to lipid rich plaque: Secondary | ICD-10-CM

## 2014-06-02 DIAGNOSIS — I4891 Unspecified atrial fibrillation: Secondary | ICD-10-CM

## 2014-06-02 DIAGNOSIS — Z7901 Long term (current) use of anticoagulants: Secondary | ICD-10-CM

## 2014-06-02 DIAGNOSIS — I739 Peripheral vascular disease, unspecified: Secondary | ICD-10-CM | POA: Diagnosis not present

## 2014-06-02 DIAGNOSIS — I2510 Atherosclerotic heart disease of native coronary artery without angina pectoris: Secondary | ICD-10-CM | POA: Diagnosis not present

## 2014-06-02 DIAGNOSIS — I255 Ischemic cardiomyopathy: Secondary | ICD-10-CM | POA: Diagnosis not present

## 2014-06-02 DIAGNOSIS — E785 Hyperlipidemia, unspecified: Secondary | ICD-10-CM

## 2014-06-02 DIAGNOSIS — I429 Cardiomyopathy, unspecified: Secondary | ICD-10-CM

## 2014-06-02 LAB — POCT INR: INR: 1.5

## 2014-06-02 NOTE — Assessment & Plan Note (Signed)
History of ischemic cardiac myopathy with an EF of 30-35% back in October 2013 status post biventricular ICD implantation by Dr. Ladona Ridgel 11/14. He is asymptomatic. I will recheck a 2-D echo to assess whether he is a "CRT responder".

## 2014-06-02 NOTE — Patient Instructions (Signed)
  We will see you back in follow up in 1 year with Dr Allyson Sabal.   Dr Allyson Sabal has ordered: 1.  Echocardiogram. Echocardiography is a painless test that uses sound waves to create images of your heart. It provides your doctor with information about the size and shape of your heart and how well your heart's chambers and valves are working. This procedure takes approximately one hour. There are no restrictions for this procedure.   2. Lower extremity arterial doppler- During this test, ultrasound is used to evaluate arterial blood flow in the legs. Allow approximately one hour for this exam.

## 2014-06-02 NOTE — Progress Notes (Signed)
06/02/2014 Maxwell Fitzgerald   [birth year]  [id number]  Primary Physician Maxwell Patella, MD Primary Cardiologist: Maxwell Gess MD Maxwell Fitzgerald   HPI:  Maxwell Fitzgerald is a 64 year old married Caucasian male father of 2 daughters, grandfather and 2 grandchildren he was formally a patient of Maxwell Fitzgerald. 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 I last saw him in the office 11/24/12. 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 He has a history of coronary artery disease status post bypass grafting in 1998 with a LIMA to his LAD, vein to obtuse marginal branches 1, 2, and 3 as well as to the PDA and PLA. 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 Suspect is otherwise are remarkable for treated diabetes, hypertension, hyperlipidemia. He stopped smoking October of 1997. He does have a family history of heart disease with both parents who had bypass surgery. He was hospitalized in March 2014 with congestive heart failure and was diuresed. He also has chronic renal insufficiency followed by Maxwell Fitzgerald. He has chronic atrial fibrillation rate controlled on Coumadin anticoagulation. I referred him to Maxwell Fitzgerald tomorrow for consideration of biventricular ICD implantation. He ultimately had this performed at [facility] Fitzgerald November 2014. He checks his quarterly at home regularly and also sees Maxwell Fitzgerald back yearly. He has not noticed any change in his symptoms. He has not yet had a 2-D echo to assess whether or not he is a "CRT responder".   Current Outpatient Prescriptions  Medication Sig Dispense Refill  . allopurinol (ZYLOPRIM) 100 MG tablet Take 200 mg by mouth daily.    Marland Kitchen aspirin EC 81 MG tablet Take 81 mg by mouth at bedtime.    Marland Kitchen atorvastatin (LIPITOR) 20 MG tablet Take 1 tablet (20 mg total) by mouth daily at 6 PM. 30 tablet 0  . calcitRIOL  (ROCALTROL) 0.25 MCG capsule Take 0.25 mcg by mouth every other day.    . colchicine 0.6 MG tablet Take 0.6 mg by mouth daily as needed.    . diltiazem (CARDIZEM) 120 MG tablet Take 0.5 tablets (60 mg total) by mouth 2 (two) times daily. 90 tablet 2  . furosemide (LASIX) 80 MG tablet Take 40 mg by mouth 2 (two) times daily.     Marland Kitchen glimepiride (AMARYL) 2 MG tablet Take 2 mg by mouth 2 (two) times daily.     . insulin glargine (LANTUS) 100 UNIT/ML injection Inject 8 Units into the skin daily.    Marland Kitchen LORazepam (ATIVAN) 1 MG tablet Take 1 mg by mouth at bedtime.    Marland Kitchen losartan (COZAAR) 50 MG tablet Take 50 mg by mouth daily.     . metoprolol (LOPRESSOR) 50 MG tablet Take 25 mg by mouth 2 (two) times daily.     . metoprolol (LOPRESSOR) 50 MG tablet Take 1 tablet (50 mg total) by mouth 2 (two) times daily. 180 tablet 3  . Maxwell Fitzgerald (CHILDRENS CHEWABLE MULTI VITS PO) Take 2 tablets by mouth daily.    . sodium polystyrene (KAYEXALATE) 15 GM/60ML suspension Take 15 g by mouth every other day.    . vardenafil (LEVITRA) 20 MG tablet Take 20 mg by mouth daily as needed for erectile dysfunction.    Marland Kitchen warfarin (COUMADIN) 5 MG tablet Take 1 to 1.5 tablets by mouth daily as directed by coumadin clinic 120  tablet 1  . warfarin (COUMADIN) 7.5 MG tablet Take 1-3 tablets by mouth weekly as directed by coumadin clinic 36 tablet 1   No current facility-administered medications for this visit.    Allergies  Allergen Reactions  . Potassium Chloride Other (See Comments)  . Potassium-Containing Compounds     Kidney failure: Specifically Potassium Chloride    History   Social History  . Marital Status: Married    Spouse Name: N/A  . Number of Children: N/A  . Years of Education: N/A   Occupational History  . Not on file.   Social History Main Topics  . Smoking status: Former Smoker    Quit date: 05/20/1995  . Smokeless tobacco: Never Used  . Alcohol Use: Yes     Comment: 2 drinks/week    . Drug Use: No     Comment: quit in 1972  . Sexual Activity: Not on file   Other Topics Concern  . Not on file   Social History Narrative     Review of Systems: General: negative for chills, fever, night sweats or weight changes.  Cardiovascular: negative for chest pain, dyspnea on exertion, edema, orthopnea, palpitations, paroxysmal nocturnal dyspnea or shortness of breath Dermatological: negative for rash Respiratory: negative for cough or wheezing Urologic: negative for hematuria Abdominal: negative for nausea, vomiting, diarrhea, bright red blood per rectum, melena, or hematemesis Neurologic: negative for visual changes, syncope, or dizziness All other systems reviewed and are otherwise negative except as noted above.    Blood pressure 104/62, pulse 79, height 5\' 10"  (1.778 m), weight 177 lb 6.4 oz (80.468 kg).  General appearance: alert and no distress Neck: no adenopathy, no carotid bruit, no JVD, supple, symmetrical, trachea midline and thyroid not enlarged, symmetric, no tenderness/mass/nodules Lungs: clear to auscultation bilaterally Heart: irregularly irregular rhythm Extremities: extremities normal, atraumatic, no cyanosis or edema  EKG atrial fibrillation with intermittent ventricular pacing and lateral T-wave inversion. His ventricular response of 79. I personally reviewed this EKG.  ASSESSMENT AND PLAN:   PAD (peripheral artery disease) History of PAD status post intervention by Maxwell Fitzgerald back in 1997 and 1998 for SFA disease. He has not had Dopplers in over 2 years. We will will recheck lower extremity arterial Doppler studies   Hypertension History of hypertension with blood pressure measured at 104/62. He is on diltiazem, losartan, and metoprolol. Continue current meds at current dosing   Dyslipidemia History of hyperlipidemia on atorvastatin 20 mg a day followed by his PCP   Coronary artery disease, hx of CABG 1998, LIMA-LAD;VG-OM1-OM2-OM3;  VG-PDA/PLA History of CAD status post coronary artery bypass grafting in 1998 with a LIMA to his LAD, vein to obtuse marginal branch 12 and 3 as well as to the PDA and PLA. He does have ischemic cardio myopathy with an EF of 30-35% range. He denies chest pain or shortness of breath.   Cardiomyopathy, EF 30-35% by echo 03/2012 History of ischemic cardiac myopathy with an EF of 30-35% back in October 2013 status post biventricular ICD implantation by Maxwell Fitzgerald 11/14. He is asymptomatic. I will recheck a 2-D echo to assess whether he is a "CRT responder".   Atrial fibrillation, permanent History of chronic atrophic fibrillation on Coumadin anticoagulation rate controlled.       Maxwell Gess MD FACP,FACC,FAHA, [facility] 06/02/2014 9:35 AM

## 2014-06-02 NOTE — Assessment & Plan Note (Signed)
History of PAD status post intervention by Dr. Alanda Amass back in 1997 and 1998 for SFA disease. He has not had Dopplers in over 2 years. We will will recheck lower extremity arterial Doppler studies

## 2014-06-02 NOTE — Assessment & Plan Note (Signed)
History of hypertension with blood pressure measured at 104/62. He is on diltiazem, losartan, and metoprolol. Continue current meds at current dosing

## 2014-06-02 NOTE — Assessment & Plan Note (Signed)
History of chronic atrophic fibrillation on Coumadin anticoagulation rate controlled.

## 2014-06-02 NOTE — Assessment & Plan Note (Signed)
History of CAD status post coronary artery bypass grafting in 1998 with a LIMA to his LAD, vein to obtuse marginal branch 12 and 3 as well as to the PDA and PLA. He does have ischemic cardio myopathy with an EF of 30-35% range. He denies chest pain or shortness of breath.

## 2014-06-02 NOTE — Assessment & Plan Note (Signed)
History of hyperlipidemia on atorvastatin 20 mg a day followed by his PCP 

## 2014-06-14 ENCOUNTER — Other Ambulatory Visit: Payer: Self-pay | Admitting: Cardiovascular Disease

## 2014-06-14 DIAGNOSIS — I519 Heart disease, unspecified: Secondary | ICD-10-CM

## 2014-06-17 ENCOUNTER — Other Ambulatory Visit: Payer: Self-pay | Admitting: *Deleted

## 2014-06-17 DIAGNOSIS — I519 Heart disease, unspecified: Secondary | ICD-10-CM

## 2014-06-17 NOTE — Progress Notes (Signed)
Cupid order placed 

## 2014-06-18 ENCOUNTER — Other Ambulatory Visit: Payer: Self-pay | Admitting: Cardiovascular Disease

## 2014-06-21 NOTE — Telephone Encounter (Signed)
Refill submitted to patient's preferred pharmacy.  

## 2014-06-21 NOTE — Telephone Encounter (Signed)
Pt does takes 25 mg of Metoprolol. He takes 75 mg of Metoprolol 2 times a day.He was asked on Friday to call back and confirm this,

## 2014-06-23 DIAGNOSIS — I129 Hypertensive chronic kidney disease with stage 1 through stage 4 chronic kidney disease, or unspecified chronic kidney disease: Secondary | ICD-10-CM | POA: Diagnosis not present

## 2014-06-23 DIAGNOSIS — N183 Chronic kidney disease, stage 3 (moderate): Secondary | ICD-10-CM | POA: Diagnosis not present

## 2014-06-23 DIAGNOSIS — M109 Gout, unspecified: Secondary | ICD-10-CM | POA: Diagnosis not present

## 2014-06-23 DIAGNOSIS — N2581 Secondary hyperparathyroidism of renal origin: Secondary | ICD-10-CM | POA: Diagnosis not present

## 2014-06-24 ENCOUNTER — Other Ambulatory Visit (HOSPITAL_COMMUNITY): Payer: Medicare Other

## 2014-06-24 ENCOUNTER — Ambulatory Visit (HOSPITAL_COMMUNITY)
Admission: RE | Admit: 2014-06-24 | Discharge: 2014-06-24 | Disposition: A | Discharge status: Home / Self Care | Payer: Medicare Other | Source: Ambulatory Visit | Attending: Cardiology | Admitting: Cardiology

## 2014-06-24 ENCOUNTER — Ambulatory Visit (INDEPENDENT_AMBULATORY_CARE_PROVIDER_SITE_OTHER): Payer: Medicare Other | Admitting: Pharmacist Clinician (PhC)/ Clinical Pharmacy Specialist

## 2014-06-24 ENCOUNTER — Encounter (HOSPITAL_COMMUNITY): Payer: Medicare Other

## 2014-06-24 ENCOUNTER — Ambulatory Visit (HOSPITAL_COMMUNITY): Payer: Medicare Other

## 2014-06-24 ENCOUNTER — Ambulatory Visit (HOSPITAL_COMMUNITY)
Admission: RE | Admit: 2014-06-24 | Discharge: 2014-06-24 | Disposition: A | Discharge status: Home / Self Care | Payer: Medicare Other | Source: Ambulatory Visit | Attending: Cardiovascular Disease | Admitting: Cardiovascular Disease

## 2014-06-24 ENCOUNTER — Other Ambulatory Visit: Payer: Self-pay | Admitting: Cardiovascular Disease

## 2014-06-24 DIAGNOSIS — I70203 Unspecified atherosclerosis of native arteries of extremities, bilateral legs: Secondary | ICD-10-CM | POA: Diagnosis not present

## 2014-06-24 DIAGNOSIS — I482 Chronic atrial fibrillation, unspecified: Secondary | ICD-10-CM

## 2014-06-24 DIAGNOSIS — Z951 Presence of aortocoronary bypass graft: Secondary | ICD-10-CM | POA: Diagnosis not present

## 2014-06-24 DIAGNOSIS — I4891 Unspecified atrial fibrillation: Secondary | ICD-10-CM | POA: Diagnosis not present

## 2014-06-24 DIAGNOSIS — I255 Ischemic cardiomyopathy: Secondary | ICD-10-CM | POA: Diagnosis not present

## 2014-06-24 DIAGNOSIS — I519 Heart disease, unspecified: Secondary | ICD-10-CM | POA: Diagnosis not present

## 2014-06-24 DIAGNOSIS — I2510 Atherosclerotic heart disease of native coronary artery without angina pectoris: Secondary | ICD-10-CM | POA: Insufficient documentation

## 2014-06-24 DIAGNOSIS — E119 Type 2 diabetes mellitus without complications: Secondary | ICD-10-CM | POA: Insufficient documentation

## 2014-06-24 DIAGNOSIS — I429 Cardiomyopathy, unspecified: Secondary | ICD-10-CM

## 2014-06-24 DIAGNOSIS — E785 Hyperlipidemia, unspecified: Secondary | ICD-10-CM | POA: Insufficient documentation

## 2014-06-24 DIAGNOSIS — I10 Essential (primary) hypertension: Secondary | ICD-10-CM | POA: Insufficient documentation

## 2014-06-24 DIAGNOSIS — Z7901 Long term (current) use of anticoagulants: Secondary | ICD-10-CM

## 2014-06-24 DIAGNOSIS — Z8249 Family history of ischemic heart disease and other diseases of the circulatory system: Secondary | ICD-10-CM | POA: Diagnosis not present

## 2014-06-24 DIAGNOSIS — Z95810 Presence of automatic (implantable) cardiac defibrillator: Secondary | ICD-10-CM | POA: Diagnosis not present

## 2014-06-24 DIAGNOSIS — I739 Peripheral vascular disease, unspecified: Secondary | ICD-10-CM | POA: Diagnosis not present

## 2014-06-24 LAB — POCT INR: INR: 1.7

## 2014-06-24 NOTE — Progress Notes (Signed)
Bilateral Arterial Duplex Lower Ext. Completed. Preliminary results by tech - ABIs are non-compressible bilaterally, TBIs appeared within normal limits, and mild to moderate amount of calcified  plaque was noted in both legs with an equal to or greater than 50% diameter reduction.  Marilynne Halsted, BS, RDMS, RVT

## 2014-07-07 ENCOUNTER — Ambulatory Visit (INDEPENDENT_AMBULATORY_CARE_PROVIDER_SITE_OTHER): Payer: Medicare Other | Admitting: *Deleted

## 2014-07-07 ENCOUNTER — Telehealth: Payer: Self-pay | Admitting: Cardiology

## 2014-07-07 ENCOUNTER — Encounter: Payer: Self-pay | Admitting: Internal Medicine

## 2014-07-07 DIAGNOSIS — I5022 Chronic systolic (congestive) heart failure: Secondary | ICD-10-CM | POA: Diagnosis not present

## 2014-07-07 DIAGNOSIS — I255 Ischemic cardiomyopathy: Secondary | ICD-10-CM | POA: Diagnosis not present

## 2014-07-07 NOTE — Telephone Encounter (Signed)
Attempted to help pt trouble shoot monitor. After unsuccessful attempts informed pt to call tech services. Pt verbalized understanding.

## 2014-07-07 NOTE — Progress Notes (Signed)
Remote ICD transmission.   

## 2014-07-13 DIAGNOSIS — E1122 Type 2 diabetes mellitus with diabetic chronic kidney disease: Secondary | ICD-10-CM | POA: Diagnosis not present

## 2014-07-13 DIAGNOSIS — I129 Hypertensive chronic kidney disease with stage 1 through stage 4 chronic kidney disease, or unspecified chronic kidney disease: Secondary | ICD-10-CM | POA: Diagnosis not present

## 2014-07-13 DIAGNOSIS — Z0000 Encounter for general adult medical examination without abnormal findings: Secondary | ICD-10-CM | POA: Diagnosis not present

## 2014-07-13 DIAGNOSIS — I5022 Chronic systolic (congestive) heart failure: Secondary | ICD-10-CM | POA: Diagnosis not present

## 2014-07-13 DIAGNOSIS — F411 Generalized anxiety disorder: Secondary | ICD-10-CM | POA: Diagnosis not present

## 2014-07-13 DIAGNOSIS — I481 Persistent atrial fibrillation: Secondary | ICD-10-CM | POA: Diagnosis not present

## 2014-07-13 DIAGNOSIS — E782 Mixed hyperlipidemia: Secondary | ICD-10-CM | POA: Diagnosis not present

## 2014-07-13 DIAGNOSIS — N529 Male erectile dysfunction, unspecified: Secondary | ICD-10-CM | POA: Diagnosis not present

## 2014-07-13 DIAGNOSIS — Z1389 Encounter for screening for other disorder: Secondary | ICD-10-CM | POA: Diagnosis not present

## 2014-07-13 DIAGNOSIS — Z136 Encounter for screening for cardiovascular disorders: Secondary | ICD-10-CM | POA: Diagnosis not present

## 2014-07-13 DIAGNOSIS — N183 Chronic kidney disease, stage 3 (moderate): Secondary | ICD-10-CM | POA: Diagnosis not present

## 2014-07-14 ENCOUNTER — Ambulatory Visit (INDEPENDENT_AMBULATORY_CARE_PROVIDER_SITE_OTHER): Payer: Medicare Other | Admitting: Pharmacist Clinician (PhC)/ Clinical Pharmacy Specialist

## 2014-07-14 DIAGNOSIS — Z7901 Long term (current) use of anticoagulants: Secondary | ICD-10-CM | POA: Diagnosis not present

## 2014-07-14 DIAGNOSIS — I4891 Unspecified atrial fibrillation: Secondary | ICD-10-CM | POA: Diagnosis not present

## 2014-07-14 LAB — CUP PACEART REMOTE DEVICE CHECK
Battery Remaining Percentage: 78 %
Battery Voltage: 2.99 V
Date Time Interrogation Session: 20160518161756
HighPow Impedance: 91 Ohm
HighPow Impedance: 91 Ohm
Lead Channel Impedance Value: 410 Ohm
Lead Channel Impedance Value: 460 Ohm
Lead Channel Pacing Threshold Amplitude: 0.75 V
Lead Channel Pacing Threshold Pulse Width: 0.5 ms
Lead Channel Sensing Intrinsic Amplitude: 1.2 mV
Lead Channel Sensing Intrinsic Amplitude: 11.7 mV
Lead Channel Setting Pacing Amplitude: 2.5 V
Lead Channel Setting Pacing Pulse Width: 0.5 ms
Lead Channel Setting Pacing Pulse Width: 0.8 ms
Lead Channel Setting Sensing Sensitivity: 0.5 mV
MDC IDC MSMT BATTERY REMAINING LONGEVITY: 67 mo
MDC IDC MSMT LEADCHNL LV PACING THRESHOLD AMPLITUDE: 1.25 V
MDC IDC MSMT LEADCHNL LV PACING THRESHOLD PULSEWIDTH: 0.8 ms
MDC IDC MSMT LEADCHNL RV IMPEDANCE VALUE: 400 Ohm
MDC IDC SET LEADCHNL LV PACING AMPLITUDE: 2.5 V
MDC IDC SET ZONE DETECTION INTERVAL: 300 ms
Pulse Gen Serial Number: 7133629
Zone Setting Detection Interval: 250 ms

## 2014-07-14 LAB — POCT INR: INR: 2.1

## 2014-07-20 ENCOUNTER — Encounter: Payer: Self-pay | Admitting: Cardiovascular Disease

## 2014-07-21 ENCOUNTER — Encounter: Payer: Self-pay | Admitting: Cardiology

## 2014-07-23 ENCOUNTER — Encounter: Payer: Self-pay | Admitting: Cardiovascular Disease

## 2014-08-10 ENCOUNTER — Ambulatory Visit (INDEPENDENT_AMBULATORY_CARE_PROVIDER_SITE_OTHER): Payer: Medicare Other | Admitting: Pharmacist

## 2014-08-10 DIAGNOSIS — I4891 Unspecified atrial fibrillation: Secondary | ICD-10-CM

## 2014-08-10 DIAGNOSIS — Z7901 Long term (current) use of anticoagulants: Secondary | ICD-10-CM

## 2014-08-10 LAB — POCT INR: INR: 2.8

## 2014-09-09 ENCOUNTER — Ambulatory Visit (INDEPENDENT_AMBULATORY_CARE_PROVIDER_SITE_OTHER): Payer: Medicare Other | Admitting: Pharmacist Clinician (PhC)/ Clinical Pharmacy Specialist

## 2014-09-09 DIAGNOSIS — Z7901 Long term (current) use of anticoagulants: Secondary | ICD-10-CM | POA: Diagnosis not present

## 2014-09-09 DIAGNOSIS — I4891 Unspecified atrial fibrillation: Secondary | ICD-10-CM

## 2014-09-09 LAB — POCT INR: INR: 2.4

## 2014-10-03 ENCOUNTER — Other Ambulatory Visit: Payer: Self-pay | Admitting: Cardiovascular Disease

## 2014-10-05 DIAGNOSIS — N2581 Secondary hyperparathyroidism of renal origin: Secondary | ICD-10-CM | POA: Diagnosis not present

## 2014-10-05 DIAGNOSIS — I129 Hypertensive chronic kidney disease with stage 1 through stage 4 chronic kidney disease, or unspecified chronic kidney disease: Secondary | ICD-10-CM | POA: Diagnosis not present

## 2014-10-05 DIAGNOSIS — N183 Chronic kidney disease, stage 3 (moderate): Secondary | ICD-10-CM | POA: Diagnosis not present

## 2014-10-05 DIAGNOSIS — E0822 Diabetes mellitus due to underlying condition with diabetic chronic kidney disease: Secondary | ICD-10-CM | POA: Diagnosis not present

## 2014-10-05 DIAGNOSIS — M109 Gout, unspecified: Secondary | ICD-10-CM | POA: Diagnosis not present

## 2014-10-11 ENCOUNTER — Encounter: Payer: Self-pay | Admitting: Internal Medicine

## 2014-10-11 ENCOUNTER — Telehealth: Payer: Self-pay | Admitting: Cardiology

## 2014-10-11 ENCOUNTER — Ambulatory Visit (INDEPENDENT_AMBULATORY_CARE_PROVIDER_SITE_OTHER): Payer: Medicare Other | Admitting: *Deleted

## 2014-10-11 DIAGNOSIS — I5043 Acute on chronic combined systolic (congestive) and diastolic (congestive) heart failure: Secondary | ICD-10-CM | POA: Diagnosis not present

## 2014-10-11 DIAGNOSIS — I255 Ischemic cardiomyopathy: Secondary | ICD-10-CM | POA: Diagnosis not present

## 2014-10-11 DIAGNOSIS — I5022 Chronic systolic (congestive) heart failure: Secondary | ICD-10-CM

## 2014-10-11 NOTE — Telephone Encounter (Signed)
Spoke with pt and reminded pt of remote transmission that is due today. Pt verbalized understanding.   

## 2014-10-12 NOTE — Progress Notes (Signed)
Remote ICD transmission.   

## 2014-10-15 LAB — CUP PACEART REMOTE DEVICE CHECK
Battery Remaining Longevity: 65 mo
Battery Voltage: 2.99 V
Date Time Interrogation Session: 20160822201654
HIGH POWER IMPEDANCE MEASURED VALUE: 88 Ohm
HighPow Impedance: 88 Ohm
Lead Channel Impedance Value: 450 Ohm
Lead Channel Impedance Value: 460 Ohm
Lead Channel Pacing Threshold Amplitude: 0.75 V
Lead Channel Pacing Threshold Pulse Width: 0.5 ms
Lead Channel Pacing Threshold Pulse Width: 0.8 ms
Lead Channel Sensing Intrinsic Amplitude: 1.2 mV
Lead Channel Setting Pacing Amplitude: 2.5 V
Lead Channel Setting Pacing Pulse Width: 0.5 ms
Lead Channel Setting Pacing Pulse Width: 0.8 ms
Lead Channel Setting Sensing Sensitivity: 0.5 mV
MDC IDC MSMT BATTERY REMAINING PERCENTAGE: 75 %
MDC IDC MSMT LEADCHNL LV IMPEDANCE VALUE: 450 Ohm
MDC IDC MSMT LEADCHNL LV PACING THRESHOLD AMPLITUDE: 1.25 V
MDC IDC MSMT LEADCHNL RV SENSING INTR AMPL: 11.2 mV
MDC IDC SET LEADCHNL LV PACING AMPLITUDE: 2.5 V
MDC IDC SET ZONE DETECTION INTERVAL: 300 ms
Pulse Gen Serial Number: 7133629
Zone Setting Detection Interval: 250 ms

## 2014-10-20 ENCOUNTER — Ambulatory Visit (INDEPENDENT_AMBULATORY_CARE_PROVIDER_SITE_OTHER): Payer: Medicare Other | Admitting: Pharmacist Clinician (PhC)/ Clinical Pharmacy Specialist

## 2014-10-20 DIAGNOSIS — Z7901 Long term (current) use of anticoagulants: Secondary | ICD-10-CM

## 2014-10-20 DIAGNOSIS — I4891 Unspecified atrial fibrillation: Secondary | ICD-10-CM | POA: Diagnosis not present

## 2014-10-20 LAB — POCT INR: INR: 1.9

## 2014-10-21 ENCOUNTER — Encounter: Payer: Self-pay | Admitting: Cardiology

## 2014-10-28 DIAGNOSIS — Z23 Encounter for immunization: Secondary | ICD-10-CM | POA: Diagnosis not present

## 2014-12-01 ENCOUNTER — Ambulatory Visit (INDEPENDENT_AMBULATORY_CARE_PROVIDER_SITE_OTHER): Payer: Medicare Other | Admitting: Pharmacist

## 2014-12-01 DIAGNOSIS — I4891 Unspecified atrial fibrillation: Secondary | ICD-10-CM

## 2014-12-01 DIAGNOSIS — Z7901 Long term (current) use of anticoagulants: Secondary | ICD-10-CM | POA: Diagnosis not present

## 2014-12-01 LAB — POCT INR: INR: 2.2

## 2014-12-13 ENCOUNTER — Other Ambulatory Visit: Payer: Self-pay | Admitting: Pharmacist Clinician (PhC)/ Clinical Pharmacy Specialist

## 2014-12-13 MED ORDER — WARFARIN SODIUM 7.5 MG PO TABS: ORAL_TABLET | ORAL | Status: DC

## 2015-01-04 ENCOUNTER — Encounter: Payer: Self-pay | Admitting: *Deleted

## 2015-01-07 ENCOUNTER — Encounter: Payer: Self-pay | Admitting: Internal Medicine

## 2015-01-07 ENCOUNTER — Ambulatory Visit (INDEPENDENT_AMBULATORY_CARE_PROVIDER_SITE_OTHER): Payer: Medicare Other | Admitting: Internal Medicine

## 2015-01-07 ENCOUNTER — Ambulatory Visit (INDEPENDENT_AMBULATORY_CARE_PROVIDER_SITE_OTHER): Payer: Medicare Other

## 2015-01-07 ENCOUNTER — Encounter: Payer: Self-pay | Admitting: Cardiovascular Disease

## 2015-01-07 VITALS — BP 122/72 | HR 74 | Ht 70.0 in | Wt 178.8 lb

## 2015-01-07 DIAGNOSIS — I482 Chronic atrial fibrillation, unspecified: Secondary | ICD-10-CM

## 2015-01-07 DIAGNOSIS — I5022 Chronic systolic (congestive) heart failure: Secondary | ICD-10-CM | POA: Diagnosis not present

## 2015-01-07 DIAGNOSIS — I255 Ischemic cardiomyopathy: Secondary | ICD-10-CM

## 2015-01-07 DIAGNOSIS — Z95810 Presence of automatic (implantable) cardiac defibrillator: Secondary | ICD-10-CM | POA: Insufficient documentation

## 2015-01-07 DIAGNOSIS — I4891 Unspecified atrial fibrillation: Secondary | ICD-10-CM

## 2015-01-07 DIAGNOSIS — Z7901 Long term (current) use of anticoagulants: Secondary | ICD-10-CM | POA: Diagnosis not present

## 2015-01-07 DIAGNOSIS — I429 Cardiomyopathy, unspecified: Secondary | ICD-10-CM | POA: Diagnosis not present

## 2015-01-07 LAB — POCT INR: INR: 2

## 2015-01-07 NOTE — Assessment & Plan Note (Signed)
His symptoms remain class 2. He will continue his current meds. 

## 2015-01-07 NOTE — Assessment & Plan Note (Signed)
His ventricular rate is controlled. He will continue his current meds.  

## 2015-01-07 NOTE — Progress Notes (Signed)
HPI Maxwell Fitzgerald returns today for followup of his BiV ICD. He is a pleasant 64 yo man with an ICM, chronic systolic CHF, chronic atrial fibrillation who underwent BiV ICD implant 2 years ago. In the interim, he feels well and has gone back to exercising. He has not had syncope or peripheral edema. No palpitations or ICD shock. Allergies  Allergen Reactions  . Potassium Chloride Other (See Comments)    Kidney failure & heart EF issues  . Potassium-Containing Compounds     Kidney failure: Specifically Potassium Chloride     Current Outpatient Prescriptions  Medication Sig Dispense Refill  . allopurinol (ZYLOPRIM) 100 MG tablet Take 200 mg by mouth daily.    Marland Kitchen aspirin EC 81 MG tablet Take 81 mg by mouth at bedtime.    Marland Kitchen atorvastatin (LIPITOR) 20 MG tablet Take 1 tablet (20 mg total) by mouth daily at 6 PM. 30 tablet 0  . calcitRIOL (ROCALTROL) 0.25 MCG capsule Take 0.25 mcg by mouth every other day. Three times per week, Monday, Wednesday and Friday    . colchicine 0.6 MG tablet Take 0.6 mg by mouth daily as needed.    . diltiazem (CARDIZEM) 60 MG tablet Take 60 mg by mouth 2 (two) times daily.    . furosemide (LASIX) 40 MG tablet Take 40 mg by mouth 2 (two) times daily.    Marland Kitchen glimepiride (AMARYL) 2 MG tablet Take 2 mg by mouth 2 (two) times daily. 2 mg each morning, 4 mg each evening    . insulin glargine (LANTUS) 100 UNIT/ML injection Inject 8 Units into the skin daily.    Marland Kitchen LORazepam (ATIVAN) 1 MG tablet Take 1 mg by mouth at bedtime.    Marland Kitchen losartan (COZAAR) 50 MG tablet Take 50 mg by mouth daily.     . metoprolol (LOPRESSOR) 50 MG tablet Take 1 tablet (50 mg total) by mouth 2 (two) times daily. 180 tablet 3  . metoprolol tartrate (LOPRESSOR) 25 MG tablet Take 25 mg by mouth 2 (two) times daily. (Take along with a 50 mg tablet to equal 75 mg twice daily)    . Pediatric Multiple Vit-C-FA (CHILDRENS CHEWABLE MULTI VITS PO) Take 1 tablet by mouth daily.     . sildenafil (REVATIO) 20 MG  tablet Take up to 100 mg by mouth daily as needed for ED    . sodium polystyrene (KAYEXALATE) 15 GM/60ML suspension Take 15 g by mouth every other day.    . warfarin (COUMADIN) 5 MG tablet TAKE 1 TO 1.5 TABLETS BY MOUTH DAILY AS DIRECTED BY COUMADIN CLINIC 120 tablet 1  . warfarin (COUMADIN) 7.5 MG tablet Take 1-3 tablets by mouth weekly as directed by coumadin clinic 36 tablet 1   No current facility-administered medications for this visit.     Past Medical History  Diagnosis Date  . CHF (congestive heart failure) (HCC)   . Renal disorder   . Type 2 diabetes mellitus (HCC)   . COPD (chronic obstructive pulmonary disease) (HCC)   . Hypertension   . PAD (peripheral artery disease) (HCC) 1997    Bilateral SFA PTA in 1997  . Chronic anticoagulation     on coumadin  . Cardiomyopathy, EF 30-35% by echo 03/2012 05/19/2012    Decreased from 2009 Echo, 40-45%  . CKD (chronic kidney disease) stage 3, GFR 30-59 ml/min     Baseline BUN/Cr 09/2011 - 36/1.44  . Atrial fibrillation, permanent (HCC)     on warfarin; recurred  after DCCV  . LBBB (left bundle branch block)     chronic  . Coronary artery disease     s/p CABG 1998; Myoview 2008 mild septal ischemia -no cath,  . Peripheral neuropathy (HCC)   . Biventricular ICD (implantable cardioverter-defibrillator) in place     implanted by Dr. Ladona Ridgel November 2014    ROS:   All systems reviewed and negative except as noted in the HPI.   Past Surgical History  Procedure Laterality Date  . Peripheral vascular intervention  02/18/1996    Bilateral SFA PTA  . Coronary artery bypass graft  03/02/1996    x7, LIMA-LAD, SVG-OM1, SVG-OM2, SVG-OM3, SVG-PDA, SVG-PLA  . Peripheral vascular intervention  06/30/1996    R SFA-high grade 95% focal stenosis crossed w/ a 5x62mm Cordis "Opti-5" balloon dilated at 5atm-56sec, 7atm-90sec, and 5atm-120sec  . Bi-ventricular implantable cardioverter defibrillator  (crt-d)  01-08-2013    STJ CRTD implanted by Dr  Ladona Ridgel for ischemic cardiomyopathy, CHF, and LBBB  . Bi-ventricular implantable cardioverter defibrillator N/A 01/08/2013    Procedure: BI-VENTRICULAR IMPLANTABLE CARDIOVERTER DEFIBRILLATOR  (CRT-D);  Surgeon: Marinus Maw, MD;  Location: [facility] CATH LAB;  Service: Cardiovascular;  Laterality: N/A;     Family History  Problem Relation Age of Onset  . CAD Mother   . Diabetes Mother   . CAD Father   . Diabetes Father   . CAD Brother      Social History   Social History  . Marital Status: Married    Spouse Name: N/A  . Number of Children: N/A  . Years of Education: N/A   Occupational History  . Not on file.   Social History Main Topics  . Smoking status: Former Smoker    Quit date: 05/20/1995  . Smokeless tobacco: Never Used  . Alcohol Use: Yes     Comment: 2 drinks/week  . Drug Use: No     Comment: quit in 1972  . Sexual Activity: Not on file   Other Topics Concern  . Not on file   Social History Narrative     BP 122/72 mmHg  Pulse 74  Ht 5\' 10"  (1.778 m)  Wt 178 lb 12.8 oz (81.103 kg)  BMI 25.66 kg/m2  Physical Exam:  Well appearing middle aged man, NAD HEENT: Unremarkable Neck:  No JVD, no thyromegally Back:  No CVA tenderness Lungs:  Clear with no wheezes HEART:  Regular rate rhythm, no murmurs, no rubs, no clicks Abd:  soft, positive bowel sounds, no organomegally, no rebound, no guarding Ext:  2 plus pulses, no edema, no cyanosis, no clubbing Skin:  No rashes no nodules Neuro:  CN II through XII intact, motor grossly intact  DEVICE  Normal device function.  See PaceArt for details. Increased his ventricular rate to 70/min.  Assess/Plan:

## 2015-01-07 NOTE — Patient Instructions (Signed)
Medication Instructions:  Your physician recommends that you continue on your current medications as directed. Please refer to the Current Medication list given to you today.   Labwork: None ordered   Testing/Procedures: None ordered   Follow-Up: Your physician wants you to follow-up in: 12 months with Dr Taylor You will receive a reminder letter in the mail two months in advance. If you don't receive a letter, please call our office to schedule the follow-up appointment.  Remote monitoring is used to monitor your ICD from home. This monitoring reduces the number of office visits required to check your device to one time per year. It allows us to keep an eye on the functioning of your device to ensure it is working properly. You are scheduled for a device check from home on 04/11/15. You may send your transmission at any time that day. If you have a wireless device, the transmission will be sent automatically. After your physician reviews your transmission, you will receive a postcard with your next transmission date.     Any Other Special Instructions Will Be Listed Below (If Applicable).     If you need a refill on your cardiac medications before your next appointment, please call your pharmacy.   

## 2015-01-07 NOTE — Assessment & Plan Note (Signed)
His St. Jude device is working normally. His device is on the St. Jude recall list. I have explained that his risk of device failure is low. He is instructed to continue with home monitoring.

## 2015-01-09 LAB — CUP PACEART INCLINIC DEVICE CHECK
Brady Statistic RA Percent Paced: 0 %
Brady Statistic RV Percent Paced: 33 %
HIGH POWER IMPEDANCE MEASURED VALUE: 87.75 Ohm
Implantable Lead Implant Date: 20141120
Implantable Lead Location: 753859
Implantable Lead Location: 753860
Lead Channel Impedance Value: 450 Ohm
Lead Channel Impedance Value: 450 Ohm
Lead Channel Pacing Threshold Amplitude: 0.75 V
Lead Channel Pacing Threshold Pulse Width: 0.5 ms
Lead Channel Pacing Threshold Pulse Width: 0.8 ms
Lead Channel Setting Pacing Pulse Width: 0.5 ms
Lead Channel Setting Pacing Pulse Width: 0.8 ms
Lead Channel Setting Sensing Sensitivity: 0.5 mV
MDC IDC LEAD IMPLANT DT: 20141120
MDC IDC LEAD IMPLANT DT: 20141120
MDC IDC LEAD LOCATION: 753858
MDC IDC MSMT BATTERY REMAINING LONGEVITY: 68.4
MDC IDC MSMT LEADCHNL LV PACING THRESHOLD AMPLITUDE: 1.25 V
MDC IDC MSMT LEADCHNL RA IMPEDANCE VALUE: 462.5 Ohm
MDC IDC MSMT LEADCHNL RA SENSING INTR AMPL: 1.1 mV
MDC IDC MSMT LEADCHNL RV SENSING INTR AMPL: 10.7 mV
MDC IDC SESS DTM: 20161118145710
MDC IDC SET LEADCHNL LV PACING AMPLITUDE: 2.5 V
MDC IDC SET LEADCHNL RV PACING AMPLITUDE: 2.5 V
Pulse Gen Serial Number: 7133629

## 2015-01-19 ENCOUNTER — Encounter: Payer: Self-pay | Admitting: Cardiovascular Disease

## 2015-01-19 ENCOUNTER — Other Ambulatory Visit: Payer: Self-pay | Admitting: Pharmacist Clinician (PhC)/ Clinical Pharmacy Specialist

## 2015-01-19 DIAGNOSIS — I481 Persistent atrial fibrillation: Secondary | ICD-10-CM | POA: Diagnosis not present

## 2015-01-19 DIAGNOSIS — N183 Chronic kidney disease, stage 3 (moderate): Secondary | ICD-10-CM | POA: Diagnosis not present

## 2015-01-19 DIAGNOSIS — E782 Mixed hyperlipidemia: Secondary | ICD-10-CM | POA: Diagnosis not present

## 2015-01-19 DIAGNOSIS — I129 Hypertensive chronic kidney disease with stage 1 through stage 4 chronic kidney disease, or unspecified chronic kidney disease: Secondary | ICD-10-CM | POA: Diagnosis not present

## 2015-01-19 DIAGNOSIS — I5022 Chronic systolic (congestive) heart failure: Secondary | ICD-10-CM | POA: Diagnosis not present

## 2015-01-19 DIAGNOSIS — E1122 Type 2 diabetes mellitus with diabetic chronic kidney disease: Secondary | ICD-10-CM | POA: Diagnosis not present

## 2015-01-19 DIAGNOSIS — F419 Anxiety disorder, unspecified: Secondary | ICD-10-CM | POA: Diagnosis not present

## 2015-01-19 MED ORDER — WARFARIN SODIUM 5 MG PO TABS: ORAL_TABLET | ORAL | Status: DC

## 2015-01-28 DIAGNOSIS — E875 Hyperkalemia: Secondary | ICD-10-CM | POA: Diagnosis not present

## 2015-01-28 DIAGNOSIS — I129 Hypertensive chronic kidney disease with stage 1 through stage 4 chronic kidney disease, or unspecified chronic kidney disease: Secondary | ICD-10-CM | POA: Diagnosis not present

## 2015-01-28 DIAGNOSIS — M109 Gout, unspecified: Secondary | ICD-10-CM | POA: Diagnosis not present

## 2015-01-28 DIAGNOSIS — N183 Chronic kidney disease, stage 3 (moderate): Secondary | ICD-10-CM | POA: Diagnosis not present

## 2015-01-28 DIAGNOSIS — E0822 Diabetes mellitus due to underlying condition with diabetic chronic kidney disease: Secondary | ICD-10-CM | POA: Diagnosis not present

## 2015-01-28 DIAGNOSIS — N2581 Secondary hyperparathyroidism of renal origin: Secondary | ICD-10-CM | POA: Diagnosis not present

## 2015-02-16 DIAGNOSIS — S6991XA Unspecified injury of right wrist, hand and finger(s), initial encounter: Secondary | ICD-10-CM | POA: Diagnosis not present

## 2015-02-16 DIAGNOSIS — M25631 Stiffness of right wrist, not elsewhere classified: Secondary | ICD-10-CM | POA: Diagnosis not present

## 2015-02-16 DIAGNOSIS — S63501D Unspecified sprain of right wrist, subsequent encounter: Secondary | ICD-10-CM | POA: Diagnosis not present

## 2015-02-17 DIAGNOSIS — M1711 Unilateral primary osteoarthritis, right knee: Secondary | ICD-10-CM | POA: Diagnosis not present

## 2015-02-18 ENCOUNTER — Ambulatory Visit: Payer: Medicare Other | Admitting: Pharmacist Clinician (PhC)/ Clinical Pharmacy Specialist

## 2015-02-18 ENCOUNTER — Ambulatory Visit (INDEPENDENT_AMBULATORY_CARE_PROVIDER_SITE_OTHER): Payer: Medicare Other | Admitting: Pharmacist Clinician (PhC)/ Clinical Pharmacy Specialist

## 2015-02-18 DIAGNOSIS — I4891 Unspecified atrial fibrillation: Secondary | ICD-10-CM | POA: Diagnosis not present

## 2015-02-18 DIAGNOSIS — Z7901 Long term (current) use of anticoagulants: Secondary | ICD-10-CM

## 2015-02-18 LAB — POCT INR: INR: 3.9

## 2015-02-23 DIAGNOSIS — M1711 Unilateral primary osteoarthritis, right knee: Secondary | ICD-10-CM | POA: Diagnosis not present

## 2015-02-23 DIAGNOSIS — M2391 Unspecified internal derangement of right knee: Secondary | ICD-10-CM | POA: Diagnosis not present

## 2015-02-24 ENCOUNTER — Telehealth: Payer: Self-pay | Admitting: Internal Medicine

## 2015-02-24 DIAGNOSIS — M1711 Unilateral primary osteoarthritis, right knee: Secondary | ICD-10-CM | POA: Diagnosis not present

## 2015-02-24 NOTE — Telephone Encounter (Signed)
New Message  Pt request a call back to determine where he can have the suergery..  Request for surgical clearance:  What type of surgery is being performed? Arthroscopic Knee Surgery  1. Are there any medications that need to be held prior to surgery and how long? No medications   2. Name of physician performing surgery? Dr. Cornelius Moras  Comments: Per pt Dr. Cornelius Moras will not compete the surgery on the pt because of his device. Pt request a call back for clearance or a call back to determine if the he would have to go to the Outpatient at surgical center in the hospital. Will need the recommendation first. Please call back to discuss.   Tel # [phone] or [phone]  Sherri at Dr. Jonelle Sports office is the name of scheduler for the surgery. Please call with the clearance today.

## 2015-02-25 NOTE — Telephone Encounter (Signed)
Discussed with Dr Ladona Ridgel.  Recommendations are:  1) Moderate surgical risk 2) Avoid fluid shifts if possible 3)Perfer procedure be done at [facility] 4) May hold Coumadin 5 days prior to procedure

## 2015-02-28 NOTE — Progress Notes (Signed)
REVIEWING CHART IN EPIC, NOTE PT HAS ICD. LM FOR SHERRI, DR OLIN'S OR SCHEDULER,  PT NEEDS TO BE DONE AT MAIN OR, PER POLICY AT AMBULATORY SURGERY CENTER.

## 2015-03-02 NOTE — Patient Instructions (Addendum)
Maxwell Fitzgerald  03/02/2015   Your procedure is scheduled on: 03/07/2015    Report to [facility] Main  Entrance take [place]  elevators to 3rd floor to  Short Stay Center at     0845 AM.  Call this number if you have problems the morning of surgery [phone]   Remember: ONLY 1 PERSON MAY GO WITH YOU TO SHORT STAY TO GET  READY MORNING OF YOUR SURGERY.  Do not eat food or drink liquids :After Midnight.  EAt a good healthy snack prior to bedtime.    Take these medicines the morning of surgery with A SIP OF WATER: Allopurinol, diltiazem ( Cardiazem), Metoprolol ( Lopressor) DO NOT TAKE ANY DIABETIC MEDICATIONS DAY OF YOUR SURGERY                               You may not have any metal on your body including hair pins and              piercings  Do not wear jewelry,  lotions, powders or perfumes, deodorant                           Men may shave face and neck.   Do not bring valuables to the hospital. [place] IS NOT             RESPONSIBLE   FOR VALUABLES.  Contacts, dentures or bridgework may not be worn into surgery.      Patients discharged the day of surgery will not be allowed to drive home.  Name and phone number of your driver:  Special Instructions: coughing and deep breathing exercises, leg exercises               Please read over the following fact sheets you were given: _____________________________________________________________________             [name] - Preparing for Surgery Before surgery, you can play an important role.  Because skin is not sterile, your skin needs to be as free of germs as possible.  You can reduce the number of germs on your skin by washing with CHG (chlorahexidine gluconate) soap before surgery.  CHG is an antiseptic cleaner which kills germs and bonds with the skin to continue killing germs even after washing. Please DO NOT use if you have an allergy to CHG or antibacterial soaps.  If your skin becomes  reddened/irritated stop using the CHG and inform your nurse when you arrive at Short Stay. Do not shave (including legs and underarms) for at least 48 hours prior to the first CHG shower.  You may shave your face/neck. Please follow these instructions carefully:  1.  Shower with CHG Soap the night before surgery and the  morning of Surgery.  2.  If you choose to wash your hair, wash your hair first as usual with your  normal  shampoo.  3.  After you shampoo, rinse your hair and body thoroughly to remove the  shampoo.                           4.  Use CHG as you would any other liquid soap.  You can apply chg directly  to the skin and wash  Gently with a scrungie or clean washcloth.  5.  Apply the CHG Soap to your body ONLY FROM THE NECK DOWN.   Do not use on face/ open                           Wound or open sores. Avoid contact with eyes, ears mouth and genitals (private parts).                       Wash face,  Genitals (private parts) with your normal soap.             6.  Wash thoroughly, paying special attention to the area where your surgery  will be performed.  7.  Thoroughly rinse your body with warm water from the neck down.  8.  DO NOT shower/wash with your normal soap after using and rinsing off  the CHG Soap.                9.  Pat yourself dry with a clean towel.            10.  Wear clean pajamas.            11.  Place clean sheets on your bed the night of your first shower and do not  sleep with pets. Day of Surgery : Do not apply any lotions/deodorants the morning of surgery.  Please wear clean clothes to the hospital/surgery center.  FAILURE TO FOLLOW THESE INSTRUCTIONS MAY RESULT IN THE CANCELLATION OF YOUR SURGERY PATIENT SIGNATURE_________________________________  NURSE SIGNATURE__________________________________  ________________________________________________________________________   Adam Phenix  An incentive spirometer is a tool that  can help keep your lungs clear and active. This tool measures how well you are filling your lungs with each breath. Taking long deep breaths may help reverse or decrease the chance of developing breathing (pulmonary) problems (especially infection) following:  A long period of time when you are unable to move or be active. BEFORE THE PROCEDURE   If the spirometer includes an indicator to show your best effort, your nurse or respiratory therapist will set it to a desired goal.  If possible, sit up straight or lean slightly forward. Try not to slouch.  Hold the incentive spirometer in an upright position. INSTRUCTIONS FOR USE  1. Sit on the edge of your bed if possible, or sit up as far as you can in bed or on a chair. 2. Hold the incentive spirometer in an upright position. 3. Breathe out normally. 4. Place the mouthpiece in your mouth and seal your lips tightly around it. 5. Breathe in slowly and as deeply as possible, raising the piston or the ball toward the top of the column. 6. Hold your breath for 3-5 seconds or for as long as possible. Allow the piston or ball to fall to the bottom of the column. 7. Remove the mouthpiece from your mouth and breathe out normally. 8. Rest for a few seconds and repeat Steps 1 through 7 at least 10 times every 1-2 hours when you are awake. Take your time and take a few normal breaths between deep breaths. 9. The spirometer may include an indicator to show your best effort. Use the indicator as a goal to work toward during each repetition. 10. After each set of 10 deep breaths, practice coughing to be sure your lungs are clear. If you have an incision (the cut made at the time of surgery),  support your incision when coughing by placing a pillow or rolled up towels firmly against it. Once you are able to get out of bed, walk around indoors and cough well. You may stop using the incentive spirometer when instructed by your caregiver.  RISKS AND  COMPLICATIONS  Take your time so you do not get dizzy or light-headed.  If you are in pain, you may need to take or ask for pain medication before doing incentive spirometry. It is harder to take a deep breath if you are having pain. AFTER USE  Rest and breathe slowly and easily.  It can be helpful to keep track of a log of your progress. Your caregiver can provide you with a simple table to help with this. If you are using the spirometer at home, follow these instructions: [place] IF:   You are having difficultly using the spirometer.  You have trouble using the spirometer as often as instructed.  Your pain medication is not giving enough relief while using the spirometer.  You develop fever of 100.5 F (38.1 C) or higher. SEEK IMMEDIATE MEDICAL CARE IF:   You cough up bloody sputum that had not been present before.  You develop fever of 102 F (38.9 C) or greater.  You develop worsening pain at or near the incision site. MAKE SURE YOU:   Understand these instructions.  Will watch your condition.  Will get help right away if you are not doing well or get worse. Document Released: 06/18/2006 Document Revised: 04/30/2011 Document Reviewed: 08/19/2006 [facility] Patient Information 2014 [place], [place].   ________________________________________________________________________

## 2015-03-03 ENCOUNTER — Encounter (HOSPITAL_COMMUNITY)
Admission: RE | Admit: 2015-03-03 | Discharge: 2015-03-03 | Disposition: A | Discharge status: Home / Self Care | Payer: Medicare Other | Source: Ambulatory Visit | Attending: Orthopedic Surgery | Admitting: Orthopedic Surgery

## 2015-03-03 ENCOUNTER — Encounter (HOSPITAL_COMMUNITY): Payer: Self-pay

## 2015-03-03 DIAGNOSIS — S83281A Other tear of lateral meniscus, current injury, right knee, initial encounter: Secondary | ICD-10-CM | POA: Diagnosis not present

## 2015-03-03 DIAGNOSIS — Z01812 Encounter for preprocedural laboratory examination: Secondary | ICD-10-CM | POA: Insufficient documentation

## 2015-03-03 DIAGNOSIS — S83241A Other tear of medial meniscus, current injury, right knee, initial encounter: Secondary | ICD-10-CM | POA: Insufficient documentation

## 2015-03-03 DIAGNOSIS — X58XXXA Exposure to other specified factors, initial encounter: Secondary | ICD-10-CM | POA: Diagnosis not present

## 2015-03-03 HISTORY — DX: Presence of automatic (implantable) cardiac defibrillator: Z95.810

## 2015-03-03 LAB — CBC
HEMATOCRIT: 34.8 % — AB (ref 39.0–52.0)
Hemoglobin: 11.5 g/dL — ABNORMAL LOW (ref 13.0–17.0)
MCH: 32.5 pg (ref 26.0–34.0)
MCHC: 33 g/dL (ref 30.0–36.0)
MCV: 98.3 fL (ref 78.0–100.0)
PLATELETS: 144 10*3/uL — AB (ref 150–400)
RBC: 3.54 MIL/uL — AB (ref 4.22–5.81)
RDW: 13.2 % (ref 11.5–15.5)
WBC: 6.9 10*3/uL (ref 4.0–10.5)

## 2015-03-03 LAB — PROTIME-INR
INR: 1.91 — ABNORMAL HIGH (ref 0.00–1.49)
Prothrombin Time: 21.8 seconds — ABNORMAL HIGH (ref 11.6–15.2)

## 2015-03-03 LAB — BASIC METABOLIC PANEL
ANION GAP: 9 (ref 5–15)
BUN: 79 mg/dL — ABNORMAL HIGH (ref 6–20)
CO2: 30 mmol/L (ref 22–32)
Calcium: 9.3 mg/dL (ref 8.9–10.3)
Chloride: 101 mmol/L (ref 101–111)
Creatinine, Ser: 2.47 mg/dL — ABNORMAL HIGH (ref 0.61–1.24)
GFR calc Af Amer: 30 mL/min — ABNORMAL LOW (ref >60)
GFR, EST NON AFRICAN AMERICAN: 26 mL/min — AB (ref >60)
GLUCOSE: 172 mg/dL — AB (ref 65–99)
POTASSIUM: 4.9 mmol/L (ref 3.5–5.1)
Sodium: 140 mmol/L (ref 135–145)

## 2015-03-03 NOTE — Progress Notes (Signed)
EKG- 01/07/15- EPIC  Last Device check- 01/07/15- EPIC  06/24/14- ECHO-EPIC  01/07/15- LOV with Dr Rosette Reveal- EPIC

## 2015-03-03 NOTE — Progress Notes (Signed)
Requested most recent HGA1C from PCP- Dr Elias Else.

## 2015-03-03 NOTE — Progress Notes (Signed)
BMP and Pt/INR results of 03/03/2015 faxed via EPIC to Dr Charlann Boxer.

## 2015-03-04 ENCOUNTER — Encounter: Payer: Self-pay | Admitting: Cardiovascular Disease

## 2015-03-04 NOTE — Progress Notes (Signed)
Rerequested HGA1C done at office of Dr Elias Else.  They are to fax.

## 2015-03-04 NOTE — Progress Notes (Signed)
Rerequested HGA1C for third time from office of Dr Elias Else.  Will have HGA1C done am of surgery.  Placed order in EPIC.

## 2015-03-05 ENCOUNTER — Encounter: Payer: Self-pay | Admitting: Cardiovascular Disease

## 2015-03-07 ENCOUNTER — Ambulatory Visit (HOSPITAL_COMMUNITY): Payer: Medicare Other | Admitting: Anesthesiology

## 2015-03-07 ENCOUNTER — Encounter (HOSPITAL_COMMUNITY)
Admission: RE | Disposition: A | Discharge status: Home / Self Care | Payer: Self-pay | Source: Ambulatory Visit | Attending: Orthopedic Surgery

## 2015-03-07 ENCOUNTER — Encounter (HOSPITAL_COMMUNITY): Payer: Self-pay | Admitting: *Deleted

## 2015-03-07 ENCOUNTER — Ambulatory Visit (HOSPITAL_COMMUNITY)
Admission: RE | Admit: 2015-03-07 | Discharge: 2015-03-07 | Disposition: A | Discharge status: Home / Self Care | Payer: Medicare Other | Source: Ambulatory Visit | Attending: Orthopedic Surgery | Admitting: Orthopedic Surgery

## 2015-03-07 DIAGNOSIS — E1122 Type 2 diabetes mellitus with diabetic chronic kidney disease: Secondary | ICD-10-CM | POA: Insufficient documentation

## 2015-03-07 DIAGNOSIS — I429 Cardiomyopathy, unspecified: Secondary | ICD-10-CM | POA: Insufficient documentation

## 2015-03-07 DIAGNOSIS — Z7901 Long term (current) use of anticoagulants: Secondary | ICD-10-CM | POA: Insufficient documentation

## 2015-03-07 DIAGNOSIS — I509 Heart failure, unspecified: Secondary | ICD-10-CM | POA: Diagnosis not present

## 2015-03-07 DIAGNOSIS — I130 Hypertensive heart and chronic kidney disease with heart failure and stage 1 through stage 4 chronic kidney disease, or unspecified chronic kidney disease: Secondary | ICD-10-CM | POA: Diagnosis not present

## 2015-03-07 DIAGNOSIS — I482 Chronic atrial fibrillation: Secondary | ICD-10-CM | POA: Diagnosis not present

## 2015-03-07 DIAGNOSIS — N183 Chronic kidney disease, stage 3 (moderate): Secondary | ICD-10-CM | POA: Insufficient documentation

## 2015-03-07 DIAGNOSIS — S83281A Other tear of lateral meniscus, current injury, right knee, initial encounter: Secondary | ICD-10-CM | POA: Diagnosis not present

## 2015-03-07 DIAGNOSIS — Z794 Long term (current) use of insulin: Secondary | ICD-10-CM | POA: Insufficient documentation

## 2015-03-07 DIAGNOSIS — E1151 Type 2 diabetes mellitus with diabetic peripheral angiopathy without gangrene: Secondary | ICD-10-CM | POA: Diagnosis not present

## 2015-03-07 DIAGNOSIS — I447 Left bundle-branch block, unspecified: Secondary | ICD-10-CM | POA: Diagnosis not present

## 2015-03-07 DIAGNOSIS — M94261 Chondromalacia, right knee: Secondary | ICD-10-CM | POA: Insufficient documentation

## 2015-03-07 DIAGNOSIS — Z7982 Long term (current) use of aspirin: Secondary | ICD-10-CM | POA: Diagnosis not present

## 2015-03-07 DIAGNOSIS — Z79899 Other long term (current) drug therapy: Secondary | ICD-10-CM | POA: Insufficient documentation

## 2015-03-07 DIAGNOSIS — J449 Chronic obstructive pulmonary disease, unspecified: Secondary | ICD-10-CM | POA: Diagnosis not present

## 2015-03-07 DIAGNOSIS — M2241 Chondromalacia patellae, right knee: Secondary | ICD-10-CM | POA: Diagnosis not present

## 2015-03-07 DIAGNOSIS — M6751 Plica syndrome, right knee: Secondary | ICD-10-CM | POA: Insufficient documentation

## 2015-03-07 DIAGNOSIS — Z951 Presence of aortocoronary bypass graft: Secondary | ICD-10-CM | POA: Insufficient documentation

## 2015-03-07 DIAGNOSIS — E1142 Type 2 diabetes mellitus with diabetic polyneuropathy: Secondary | ICD-10-CM | POA: Insufficient documentation

## 2015-03-07 DIAGNOSIS — Z95810 Presence of automatic (implantable) cardiac defibrillator: Secondary | ICD-10-CM | POA: Insufficient documentation

## 2015-03-07 DIAGNOSIS — I2510 Atherosclerotic heart disease of native coronary artery without angina pectoris: Secondary | ICD-10-CM | POA: Insufficient documentation

## 2015-03-07 DIAGNOSIS — I129 Hypertensive chronic kidney disease with stage 1 through stage 4 chronic kidney disease, or unspecified chronic kidney disease: Secondary | ICD-10-CM | POA: Diagnosis not present

## 2015-03-07 DIAGNOSIS — N189 Chronic kidney disease, unspecified: Secondary | ICD-10-CM | POA: Diagnosis not present

## 2015-03-07 DIAGNOSIS — M25561 Pain in right knee: Secondary | ICD-10-CM | POA: Diagnosis present

## 2015-03-07 DIAGNOSIS — S83241A Other tear of medial meniscus, current injury, right knee, initial encounter: Secondary | ICD-10-CM | POA: Diagnosis not present

## 2015-03-07 HISTORY — PX: KNEE ARTHROSCOPY: SHX127

## 2015-03-07 LAB — GLUCOSE, CAPILLARY
GLUCOSE-CAPILLARY: 107 mg/dL — AB (ref 65–99)
Glucose-Capillary: 81 mg/dL (ref 65–99)

## 2015-03-07 LAB — PROTIME-INR
INR: 1.16 (ref 0.00–1.49)
PROTHROMBIN TIME: 15 s (ref 11.6–15.2)

## 2015-03-07 SURGERY — ARTHROSCOPY, KNEE
Anesthesia: General | Site: Knee | Laterality: Right

## 2015-03-07 MED ORDER — HYDROCODONE-ACETAMINOPHEN 7.5-325 MG PO TABS
1.0000 | ORAL_TABLET | ORAL | Status: DC | PRN
  Administered 2015-03-07: 1 via ORAL
  Filled 2015-03-07: qty 1

## 2015-03-07 MED ORDER — DEXAMETHASONE SODIUM PHOSPHATE 10 MG/ML IJ SOLN
INTRAMUSCULAR | Status: AC
  Filled 2015-03-07: qty 1

## 2015-03-07 MED ORDER — ONDANSETRON HCL 4 MG/2ML IJ SOLN
INTRAMUSCULAR | Status: AC
  Filled 2015-03-07: qty 2

## 2015-03-07 MED ORDER — BUPIVACAINE HCL 0.25 % IJ SOLN
INTRAMUSCULAR | Status: DC | PRN
  Administered 2015-03-07: 30 mL

## 2015-03-07 MED ORDER — LIDOCAINE HCL (CARDIAC) 20 MG/ML IV SOLN
INTRAVENOUS | Status: DC | PRN
  Administered 2015-03-07: 50 mg via INTRAVENOUS

## 2015-03-07 MED ORDER — FENTANYL CITRATE (PF) 100 MCG/2ML IJ SOLN
INTRAMUSCULAR | Status: AC
  Filled 2015-03-07: qty 2

## 2015-03-07 MED ORDER — HYDROCODONE-ACETAMINOPHEN 7.5-325 MG PO TABS: 1.0000 | ORAL_TABLET | ORAL | Status: DC | PRN

## 2015-03-07 MED ORDER — PHENYLEPHRINE HCL 10 MG/ML IJ SOLN
INTRAMUSCULAR | Status: DC | PRN
  Administered 2015-03-07 (×6): 80 ug via INTRAVENOUS

## 2015-03-07 MED ORDER — BUPIVACAINE-EPINEPHRINE 0.25% -1:200000 IJ SOLN
INTRAMUSCULAR | Status: AC
  Filled 2015-03-07: qty 1

## 2015-03-07 MED ORDER — PROPOFOL 10 MG/ML IV BOLUS
INTRAVENOUS | Status: DC | PRN
  Administered 2015-03-07: 100 mg via INTRAVENOUS

## 2015-03-07 MED ORDER — SODIUM CHLORIDE 0.9 % IV SOLN
INTRAVENOUS | Status: DC
  Administered 2015-03-07 (×2): via INTRAVENOUS
  Administered 2015-03-07: 1000 mL via INTRAVENOUS

## 2015-03-07 MED ORDER — HYDROMORPHONE HCL 1 MG/ML IJ SOLN: 0.2500 mg | INTRAMUSCULAR | Status: DC | PRN

## 2015-03-07 MED ORDER — ONDANSETRON HCL 4 MG/2ML IJ SOLN
INTRAMUSCULAR | Status: DC | PRN
  Administered 2015-03-07: 4 mg via INTRAVENOUS

## 2015-03-07 MED ORDER — BUPIVACAINE-EPINEPHRINE (PF) 0.25% -1:200000 IJ SOLN
INTRAMUSCULAR | Status: AC
  Filled 2015-03-07: qty 30

## 2015-03-07 MED ORDER — MIDAZOLAM HCL 2 MG/2ML IJ SOLN
INTRAMUSCULAR | Status: AC
  Filled 2015-03-07: qty 2

## 2015-03-07 MED ORDER — FENTANYL CITRATE (PF) 100 MCG/2ML IJ SOLN
INTRAMUSCULAR | Status: DC | PRN
  Administered 2015-03-07: 25 ug via INTRAVENOUS
  Administered 2015-03-07: 50 ug via INTRAVENOUS

## 2015-03-07 MED ORDER — CEFAZOLIN SODIUM-DEXTROSE 2-3 GM-% IV SOLR
2.0000 g | INTRAVENOUS | Status: AC
  Administered 2015-03-07: 2 g via INTRAVENOUS

## 2015-03-07 MED ORDER — LIDOCAINE-EPINEPHRINE 1 %-1:100000 IJ SOLN
INTRAMUSCULAR | Status: DC | PRN
  Administered 2015-03-07: 30 mL

## 2015-03-07 MED ORDER — DEXAMETHASONE SODIUM PHOSPHATE 10 MG/ML IJ SOLN
INTRAMUSCULAR | Status: DC | PRN
  Administered 2015-03-07: 10 mg via INTRAVENOUS

## 2015-03-07 MED ORDER — LACTATED RINGERS IR SOLN
Status: DC | PRN
  Administered 2015-03-07: 6000 mL

## 2015-03-07 MED ORDER — METHOCARBAMOL 500 MG PO TABS: 500.0000 mg | ORAL_TABLET | Freq: Four times a day (QID) | ORAL | Status: DC | PRN

## 2015-03-07 MED ORDER — CHLORHEXIDINE GLUCONATE 4 % EX LIQD: 60.0000 mL | Freq: Once | CUTANEOUS | Status: DC

## 2015-03-07 MED ORDER — LIDOCAINE-EPINEPHRINE 1 %-1:100000 IJ SOLN
INTRAMUSCULAR | Status: AC
  Filled 2015-03-07: qty 1

## 2015-03-07 MED ORDER — MIDAZOLAM HCL 5 MG/5ML IJ SOLN
INTRAMUSCULAR | Status: DC | PRN
  Administered 2015-03-07: 2 mg via INTRAVENOUS

## 2015-03-07 MED ORDER — CEFAZOLIN SODIUM-DEXTROSE 2-3 GM-% IV SOLR
INTRAVENOUS | Status: AC
  Filled 2015-03-07: qty 50

## 2015-03-07 SURGICAL SUPPLY — 32 items
BANDAGE ACE 6X5 VEL STRL LF (GAUZE/BANDAGES/DRESSINGS) ×2 IMPLANT
BLADE CUDA SHAVER 3.5 (BLADE) ×3 IMPLANT
BLADE SURG SZ11 CARB STEEL (BLADE) IMPLANT
CLOTH BEACON ORANGE TIMEOUT ST (SAFETY) ×3 IMPLANT
COUNTER NEEDLE 20 DBL MAG RED (NEEDLE) ×3 IMPLANT
COVER SURGICAL LIGHT HANDLE (MISCELLANEOUS) ×3 IMPLANT
DRAPE U-SHAPE 47X51 STRL (DRAPES) ×2 IMPLANT
DRSG EMULSION OIL 3X3 NADH (GAUZE/BANDAGES/DRESSINGS) ×3 IMPLANT
DURAPREP 26ML APPLICATOR (WOUND CARE) ×3 IMPLANT
GAUZE SPONGE 4X4 12PLY STRL (GAUZE/BANDAGES/DRESSINGS) ×2 IMPLANT
GAUZE SPONGE 4X4 16PLY XRAY LF (GAUZE/BANDAGES/DRESSINGS) ×3 IMPLANT
GLOVE BIOGEL M 7.0 STRL (GLOVE) IMPLANT
GLOVE BIOGEL PI IND STRL 7.5 (GLOVE) ×1 IMPLANT
GLOVE BIOGEL PI INDICATOR 7.5 (GLOVE) ×2
GLOVE ORTHO TXT STRL SZ7.5 (GLOVE) ×3 IMPLANT
GOWN STRL REUS W/TWL LRG LVL3 (GOWN DISPOSABLE) ×3 IMPLANT
GOWN STRL REUS W/TWL XL LVL3 (GOWN DISPOSABLE) IMPLANT
KIT BASIN OR (CUSTOM PROCEDURE TRAY) ×3 IMPLANT
MANIFOLD NEPTUNE II (INSTRUMENTS) ×3 IMPLANT
MARKER PEN SURG W/LABELS BLK (STERILIZATION PRODUCTS) ×3 IMPLANT
NDL SAFETY ECLIPSE 18X1.5 (NEEDLE) IMPLANT
NEEDLE HYPO 18GX1.5 SHARP (NEEDLE) ×3
PACK ARTHROSCOPY WL (CUSTOM PROCEDURE TRAY) ×3 IMPLANT
PAD ABD 8X10 STRL (GAUZE/BANDAGES/DRESSINGS) ×2 IMPLANT
PAD MASON LEG HOLDER (PIN) ×3 IMPLANT
PADDING CAST COTTON 6X4 STRL (CAST SUPPLIES) ×3 IMPLANT
SET ARTHROSCOPY TUBING (MISCELLANEOUS) ×3
SET ARTHROSCOPY TUBING LN (MISCELLANEOUS) ×1 IMPLANT
SUT ETHILON 4 0 PS 2 18 (SUTURE) ×3 IMPLANT
SYR 30ML LL (SYRINGE) ×3 IMPLANT
TOWEL OR 17X26 10 PK STRL BLUE (TOWEL DISPOSABLE) ×3 IMPLANT
WRAP KNEE MAXI GEL POST OP (GAUZE/BANDAGES/DRESSINGS) ×3 IMPLANT

## 2015-03-07 NOTE — H&P (Signed)
CC- Maxwell Fitzgerald is a 65 y.o. male who presents with right knee pain. He has been seen and attempted to alleviate knee pain in clinic.  Ultrasound was done to evaluate knee due to inability to get MRI, confirmed clinical suspicion of lateral and medial meniscal tears.  HPI- . Knee Pain: Patient presents for follow up on a knee problem involving the  right knee. Onset of the symptoms was several months ago. Inciting event: none known. Current symptoms include giving out, pain located primarily lateral but also medial along the joint and popping sensation. Pain is aggravated by any weight bearing, kneeling, lateral movements and pivoting.  Patient has had no prior knee right problems but we have scoped his left knee in past. Evaluation to date: plain films: normal and ultrasound due to inability to perform MRI. The ultrasound revealed lateral and probable medial meniscal tears.. Treatment to date: avoidance of offending activity, corticosteroid injection which was not very effective and rest as NSAIDS contraindicated due to impaired renal function.  Past Medical History  Diagnosis Date  . CHF (congestive heart failure) (HCC)   . Renal disorder   . Type 2 diabetes mellitus (HCC)   . COPD (chronic obstructive pulmonary disease) (HCC)   . Hypertension   . PAD (peripheral artery disease) (HCC) 1997    Bilateral SFA PTA in 1997  . Chronic anticoagulation     on coumadin  . Cardiomyopathy, EF 30-35% by echo 03/2012 05/19/2012    Decreased from 2009 Echo, 40-45%  . CKD (chronic kidney disease) stage 3, GFR 30-59 ml/min     Baseline BUN/Cr 09/2011 - 36/1.44  . Atrial fibrillation, permanent (HCC)     on warfarin; recurred after DCCV  . LBBB (left bundle branch block)     chronic  . Coronary artery disease     s/p CABG 1998; Myoview 2008 mild septal ischemia -no cath,  . Peripheral neuropathy (HCC)   . Biventricular ICD (implantable cardioverter-defibrillator) in place     implanted by Dr. Ladona Ridgel  November 2014  . AICD (automatic cardioverter/defibrillator) present     Past Surgical History  Procedure Laterality Date  . Peripheral vascular intervention  02/18/1996    Bilateral SFA PTA  . Coronary artery bypass graft  03/02/1996    x7, LIMA-LAD, SVG-OM1, SVG-OM2, SVG-OM3, SVG-PDA, SVG-PLA  . Peripheral vascular intervention  06/30/1996    R SFA-high grade 95% focal stenosis crossed w/ a 5x34mm Cordis "Opti-5" balloon dilated at 5atm-56sec, 7atm-90sec, and 5atm-120sec  . Bi-ventricular implantable cardioverter defibrillator  (crt-d)  01-08-2013    STJ CRTD implanted by Dr Ladona Ridgel for ischemic cardiomyopathy, CHF, and LBBB  . Bi-ventricular implantable cardioverter defibrillator N/A 01/08/2013    Procedure: BI-VENTRICULAR IMPLANTABLE CARDIOVERTER DEFIBRILLATOR  (CRT-D);  Surgeon: Marinus Maw, MD;  Location: [facility] CATH LAB;  Service: Cardiovascular;  Laterality: N/A;    Prior to Admission medications   Medication Sig Start Date End Date Taking? Authorizing Provider  allopurinol (ZYLOPRIM) 100 MG tablet Take 100 mg by mouth 2 (two) times daily.    Yes Historical Provider, MD  aspirin EC 81 MG tablet Take 81 mg by mouth at bedtime.   Yes Historical Provider, MD  atorvastatin (LIPITOR) 20 MG tablet Take 1 tablet (20 mg total) by mouth daily at 6 PM. Patient taking differently: Take 20 mg by mouth daily.  05/21/12  Yes Elease Etienne, MD  calcitRIOL (ROCALTROL) 0.25 MCG capsule Take 0.25 mcg by mouth every other day. Three times per week, Monday,  Wednesday and Friday 07/10/13  Yes Historical Provider, MD  colchicine 0.6 MG tablet Take 0.6 mg by mouth daily as needed (for flare ups).    Yes Historical Provider, MD  diltiazem (CARDIZEM) 60 MG tablet Take 60 mg by mouth 2 (two) times daily.   Yes Historical Provider, MD  furosemide (LASIX) 40 MG tablet Take 40 mg by mouth 2 (two) times daily.   Yes Historical Provider, MD  glimepiride (AMARYL) 2 MG tablet Take 2-4 mg by mouth 2 (two) times daily.  2 mg each morning, 4 mg each evening   Yes Historical Provider, MD  insulin glargine (LANTUS) 100 UNIT/ML injection Inject 8 Units into the skin daily.   Yes Historical Provider, MD  LORazepam (ATIVAN) 1 MG tablet Take 1 mg by mouth at bedtime.    Yes Historical Provider, MD  losartan (COZAAR) 50 MG tablet Take 50 mg by mouth daily.  01/09/13  Yes Marinus Maw, MD  metoprolol (LOPRESSOR) 50 MG tablet Take 1 tablet (50 mg total) by mouth 2 (two) times daily. 04/09/14  Yes Marinus Maw, MD  metoprolol tartrate (LOPRESSOR) 25 MG tablet Take 25 mg by mouth 2 (two) times daily. (Take along with a 50 mg tablet to equal 75 mg twice daily)   Yes Historical Provider, MD  Pediatric Multiple Vit-C-FA (CHILDRENS CHEWABLE MULTI VITS PO) Take 1 tablet by mouth daily.    Yes Historical Provider, MD  sildenafil (VIAGRA) 25 MG tablet Take 25 mg by mouth daily as needed for erectile dysfunction.   Yes Historical Provider, MD  sodium polystyrene (KAYEXALATE) 15 GM/60ML suspension Take 15 g by mouth every other day. 3 days a week   Yes Historical Provider, MD  warfarin (COUMADIN) 5 MG tablet TAKE 1 TO 1.5 TABLETS BY MOUTH DAILY AS DIRECTED BY COUMADIN CLINIC Patient taking differently: Take 5-7.5 mg by mouth daily. AS DIRECTED BY COUMADIN CLINIC --- 5 mg daily except Sunday and Thursday take 7.5 mg 01/19/15  Yes Runell Gess, MD  warfarin (COUMADIN) 7.5 MG tablet Take 1-3 tablets by mouth weekly as directed by coumadin clinic Patient not taking: Reported on 03/03/2015 12/13/14   Runell Gess, MD    antalgic gait, soft tissue tenderness over lateral greater than medial joint lines, reduced range of motion, collateral ligaments intact, negative Lachman sign, normal ipsilateral hip exam, normal contralateral knee exam  Physical Examination: General appearance - alert, well appearing, and in no distress Mental status - alert, oriented to person, place, and time Chest - no tachypnea, retractions or  cyanosis Heart - known cardiac patient with defibrillator in place  Abdomen - soft, nontender, nondistended, no masses or organomegaly Musculoskeletal - see above Extremities - peripheral pulses normal, no pedal edema, no clubbing or cyanosis Skin - normal coloration and turgor, no rashes, no suspicious skin lesions noted   Asessment/Plan--- Left knee lateral and medial meniscal tear by ultrasound, 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   Plan left knee arthroscopy with meniscal debridement. Procedure risks and potential comps discussed with patient who elects to proceed. Goals are decreased pain and increased function with a high likelihood of achieving both

## 2015-03-07 NOTE — Brief Op Note (Signed)
03/07/2015  10:50 AM  PATIENT:  Maxwell Fitzgerald  65 y.o. male  PRE-OPERATIVE DIAGNOSIS:  Right knee, 1.  Lateral meniscal tear plus degenerative joint changes    POST-OPERATIVE DIAGNOSIS: Right knee, 1.  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 Medial and lateral chondral debridement, chondroplasty  SURGEON:  Surgeon(s) and Role:    * Durene Romans, MD - Primary  PHYSICIAN ASSISTANT: None  ANESTHESIA:   general  EBL:     BLOOD ADMINISTERED:none  DRAINS: none   LOCAL MEDICATIONS USED:  MARCAINE     SPECIMEN:  No Specimen  DISPOSITION OF SPECIMEN:  N/A  COUNTS:  YES  TOURNIQUET:  * No tourniquets in log *  DICTATION: .Other Dictation: Dictation Number [id number]  PLAN OF CARE: Discharge to home after PACU  PATIENT DISPOSITION:  PACU - hemodynamically stable.   Delay start of Pharmacological VTE agent (>24hrs) due to surgical blood loss or risk of bleeding: no

## 2015-03-07 NOTE — Transfer of Care (Signed)
Immediate Anesthesia Transfer of Care Note  Patient: Maxwell Fitzgerald  Procedure(s) Performed: Procedure(s): ARTHROSCOPY RIGHT KNEE WITH DEBRIDEMENT (Right)  Patient Location: PACU  Anesthesia Type:General  Level of Consciousness:  sedated, patient cooperative and responds to stimulation  Airway & Oxygen Therapy:Patient Spontanous Breathing and Patient connected to face mask oxgen  Post-op Assessment:  Report given to PACU RN and Post -op Vital signs reviewed and stable  Post vital signs:  Reviewed and stable  Last Vitals:  Filed Vitals:   03/07/15 1219  BP: 124/71  Pulse: 71  Temp: 37 C  Resp: 12    Complications: No apparent anesthesia complications

## 2015-03-07 NOTE — Op Note (Signed)
[name], [name]                 ACCOUNT NO.:  [id number]  MEDICAL RECORD NO.:  [id number]  LOCATION:  [facility]                         FACILITY:  [facility]  PHYSICIAN:  Madlyn Frankel. Charlann Boxer, M.D.  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  POSTOPERATIVE DIAGNOSES/FINDINGS: 1. 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 2. Grade 3 chondromalacia noted in the central aspect of lateral     femoral condyle. 3. Anteromedial plica.  PROCEDURES:  Right knee diagnostic and operative arthroscopy with medial and lateral chondroplasty with associated limited synovectomy related to an anteromedial plica.  SURGEON:  Madlyn Frankel. Charlann Boxer, M.D.  ASSISTANT:  Surgical team.  ANESTHESIA:  General.  SPECIMENS:  None.  COMPLICATION:  None.  DRAINS:  None.  TOURNIQUET:  Not utilized.  INDICATIONS FOR PROCEDURE:  [name] is a 65 year old male with recurring right knee symptoms failing conservative measures.  He has a history of dressing left knee meniscal pathology.  Based on the persistence and recurrence of his symptoms and inability to have an MRI performed based on having an implanted defibrillator, he had an ultrasound of his knee and there was concern raised about meniscal pathology by ultrasound.  Above most importantly based on his persistent mechanical symptoms despite attempts at conservative measures, he at this point wished to proceed with arthroscopic surgery on his knee. Again, as he had a history of this on his left knee and done very well with that.  We did discuss the risk of infection, DVT, we discussed the persistence of symptoms and the potential progressive arthritis in this age group.  Consent was obtained for benefit of pain relief.   Surgery was planned to be performed at the [facility] related to his cardiac comorbidities, cardiac clearance was obtained.  He had to stop his Coumadin before surgery.  PROCEDURE IN DETAIL:  The patient was brought to the operative theater. Once adequate anesthesia, preoperative antibiotics, Ancef administered. He was positioned supine with his right leg in leg holder.  The right lower extremity was then prepped and draped in usual sterile fashion. Time-out was performed identifying the patient, planned procedure and extremity.  The standard inferomedial, superomedial and inferolateral portals were utilized.  Diagnostic evaluation of the knee revealed the above findings.  The diagnostic evaluation of the knee was now carried out.  He was noted to have an intact patellofemoral compartment.  He had a significant plica noted over the medial side of the knee that was impinging on the medial femoral condyle as well as general hypertrophic and hyperemic synovitis anteromedial and lateral.  As I entered the medial compartment, we identified an intact lateral compartment, but some significant chondral flaps over the lateral aspect of the distal femoral condyle.  I utilized a 3.5 Cuda shaver and debrided these back to a stable level and ended it up being about 1 cm diameter area on this lateral aspect of distal medial femoral condyle. Once this was  performed, I evaluated the lateral aspect of the joint. Again, we identified an intact lateral meniscus through direct inspection.  Today, we did identify again some grade 2-3 unstable cartilage in the central aspect of the lateral femoral condyle.  Once again, using the 3.5 Cuda shaver, I debrided this back to the stable level.  Upon completion of evaluation, diagnostic as well as treatment of the medial and lateral aspect of the joint and returned to anterior again identifying no significant patellofemoral arthritis.  Instead, I used a 3.5  Cuda shaver to remove the plica from the superomedial aspect joint to the notch to prevent further impingement on the medial aspect joint.  In doing so, performed a limited synovectomy over the anterolateral aspect of the joint.  Upon completion of all of these procedures, I re-examined the knee to make sure and I was satisfied with the procedures and the meniscus remained intact and normal.  Once this was carried out, the instrumentation was removed from the knee.  Portal sites were reapproximated using 4-0 nylon.  The knee was injected at the end of the case with 0.25% Marcaine with epinephrine.  The knee was then dressed with sterile bulky Jones dressing with Adaptic over the wound.  He was then brought to the recovery room in stable condition with plan to return to the short stay prior to being discharged home.  We will see him in the office in followup in 2 weeks to review findings and some physical therapy to maximize his overall recovery.     Madlyn Frankel Charlann Boxer, M.D.     MDO/MEDQ  D:  03/07/2015  T:  03/07/2015  Job:  [id number]

## 2015-03-07 NOTE — Progress Notes (Signed)
Taking O2 off, having 4 beat runs of vtach and frequent PVC's. Asked to leave O2 in place, Yelling, " I don't need it." Explained brief time in PACU and need for O2 . Agreed to leave in place.

## 2015-03-07 NOTE — Anesthesia Postprocedure Evaluation (Signed)
Anesthesia Post Note  Patient: Maxwell Fitzgerald  Procedure(s) Performed: Procedure(s) (LRB): ARTHROSCOPY RIGHT KNEE WITH DEBRIDEMENT (Right)  Patient location during evaluation: PACU Anesthesia Type: General Level of consciousness: awake and alert Pain management: pain level controlled Vital Signs Assessment: post-procedure vital signs reviewed and stable Respiratory status: spontaneous breathing, nonlabored ventilation and respiratory function stable Cardiovascular status: blood pressure returned to baseline and stable Postop Assessment: no signs of nausea or vomiting Anesthetic complications: no    Last Vitals:  Filed Vitals:   03/07/15 1300 03/07/15 1312  BP: 129/75 147/64  Pulse: 73 71  Temp: 36.7 C 36.7 C  Resp: 14 14    Last Pain:  Filed Vitals:   03/07/15 1314  PainSc: 0-No pain                 Jonathyn Carothers,W. EDMOND

## 2015-03-07 NOTE — Anesthesia Preprocedure Evaluation (Addendum)
Anesthesia Evaluation  Patient identified by MRN, date of birth, ID band Patient awake    Reviewed: Allergy & Precautions, H&P , NPO status , Patient's Chart, lab work & pertinent test results, reviewed documented beta blocker date and time   Airway Mallampati: III  TM Distance: >3 FB Neck ROM: Full    Dental no notable dental hx. (+) Partial Upper, Dental Advisory Given   Pulmonary COPD, former smoker,    Pulmonary exam normal breath sounds clear to auscultation       Cardiovascular hypertension, Pt. on medications and Pt. on home beta blockers + CAD, + Past MI, + Peripheral Vascular Disease and +CHF  + dysrhythmias Atrial Fibrillation + Cardiac Defibrillator  Rhythm:Regular Rate:Normal     Neuro/Psych negative neurological ROS  negative psych ROS   GI/Hepatic negative GI ROS, Neg liver ROS,   Endo/Other  diabetes, Type 2, Insulin Dependent  Renal/GU Renal disease  negative genitourinary   Musculoskeletal   Abdominal   Peds  Hematology negative hematology ROS (+)   Anesthesia Other Findings   Reproductive/Obstetrics negative OB ROS                           Anesthesia Physical Anesthesia Plan  ASA: III  Anesthesia Plan: General   Post-op Pain Management:    Induction: Intravenous  Airway Management Planned: LMA  Additional Equipment:   Intra-op Plan:   Post-operative Plan: Extubation in OR  Informed Consent: I have reviewed the patients History and Physical, chart, labs and discussed the procedure including the risks, benefits and alternatives for the proposed anesthesia with the patient or authorized representative who has indicated his/her understanding and acceptance.   Dental advisory given  Plan Discussed with: CRNA  Anesthesia Plan Comments:        Anesthesia Quick Evaluation

## 2015-03-07 NOTE — Progress Notes (Signed)
Patient checked CBG this am at 705 and Result was 57. Drank 2 oz of juice and rechecked at 745 and CBG 83. Drank 1/2 oz and rechecked in 15 min and CBG was 90.

## 2015-03-08 LAB — HEMOGLOBIN A1C
Hgb A1c MFr Bld: 7.9 % — ABNORMAL HIGH (ref 4.8–5.6)
Mean Plasma Glucose: 180 mg/dL

## 2015-03-12 ENCOUNTER — Other Ambulatory Visit: Payer: Self-pay | Admitting: Cardiovascular Disease

## 2015-03-16 ENCOUNTER — Ambulatory Visit (INDEPENDENT_AMBULATORY_CARE_PROVIDER_SITE_OTHER): Payer: Medicare Other | Admitting: Pharmacist Clinician (PhC)/ Clinical Pharmacy Specialist

## 2015-03-16 ENCOUNTER — Telehealth: Payer: Self-pay | Admitting: Cardiology

## 2015-03-16 DIAGNOSIS — Z7901 Long term (current) use of anticoagulants: Secondary | ICD-10-CM | POA: Diagnosis not present

## 2015-03-16 DIAGNOSIS — I4891 Unspecified atrial fibrillation: Secondary | ICD-10-CM

## 2015-03-16 LAB — POCT INR: INR: 2

## 2015-03-16 NOTE — Telephone Encounter (Signed)
LMOVM for requesting that pt send a manual transmission w/ home monitor b/c it has not updated in at least 8 days.

## 2015-03-17 ENCOUNTER — Telehealth: Payer: Self-pay | Admitting: Internal Medicine

## 2015-03-17 DIAGNOSIS — Z4789 Encounter for other orthopedic aftercare: Secondary | ICD-10-CM | POA: Diagnosis not present

## 2015-03-17 NOTE — Telephone Encounter (Signed)
Pt informed me that his home monitor doesn't work b/c he doesn't have a land line phone anymore. I informed pt that I would have reps w/ [employer] send him a cell adapter for his home monitor. Pt verbalized understanding.

## 2015-03-17 NOTE — Telephone Encounter (Signed)
LMOVM for pt to return call 

## 2015-03-17 NOTE — Telephone Encounter (Signed)
New message     Patient calling need to discuss his remote transmitting

## 2015-04-11 ENCOUNTER — Telehealth: Payer: Self-pay | Admitting: Internal Medicine

## 2015-04-11 ENCOUNTER — Encounter: Payer: Medicare Other | Admitting: *Deleted

## 2015-04-11 NOTE — Telephone Encounter (Signed)
New message     Patient calling back  To speak with the device clinic

## 2015-04-11 NOTE — Telephone Encounter (Signed)
Spoke w/ pt and he informed me that he is still waiting for his cell adapter. He will send remote transmission once he receives it.

## 2015-04-11 NOTE — Telephone Encounter (Signed)
LMOVM for pt to return call 

## 2015-04-13 DIAGNOSIS — Z4789 Encounter for other orthopedic aftercare: Secondary | ICD-10-CM | POA: Diagnosis not present

## 2015-04-20 DIAGNOSIS — N183 Chronic kidney disease, stage 3 (moderate): Secondary | ICD-10-CM | POA: Diagnosis not present

## 2015-04-20 DIAGNOSIS — E1122 Type 2 diabetes mellitus with diabetic chronic kidney disease: Secondary | ICD-10-CM | POA: Diagnosis not present

## 2015-04-20 DIAGNOSIS — Z794 Long term (current) use of insulin: Secondary | ICD-10-CM | POA: Diagnosis not present

## 2015-04-20 DIAGNOSIS — H3500 Unspecified background retinopathy: Secondary | ICD-10-CM | POA: Diagnosis not present

## 2015-04-20 DIAGNOSIS — E113293 Type 2 diabetes mellitus with mild nonproliferative diabetic retinopathy without macular edema, bilateral: Secondary | ICD-10-CM | POA: Diagnosis not present

## 2015-04-20 DIAGNOSIS — Z7984 Long term (current) use of oral hypoglycemic drugs: Secondary | ICD-10-CM | POA: Diagnosis not present

## 2015-04-26 ENCOUNTER — Ambulatory Visit (INDEPENDENT_AMBULATORY_CARE_PROVIDER_SITE_OTHER): Payer: Medicare Other | Admitting: Pharmacist Clinician (PhC)/ Clinical Pharmacy Specialist

## 2015-04-26 DIAGNOSIS — Z7901 Long term (current) use of anticoagulants: Secondary | ICD-10-CM | POA: Diagnosis not present

## 2015-04-26 DIAGNOSIS — I4891 Unspecified atrial fibrillation: Secondary | ICD-10-CM | POA: Diagnosis not present

## 2015-04-26 LAB — POCT INR: INR: 3

## 2015-05-17 ENCOUNTER — Other Ambulatory Visit: Payer: Self-pay | Admitting: Pharmacist Clinician (PhC)/ Clinical Pharmacy Specialist

## 2015-05-17 MED ORDER — WARFARIN SODIUM 7.5 MG PO TABS: ORAL_TABLET | ORAL | Status: DC

## 2015-05-30 DIAGNOSIS — E113293 Type 2 diabetes mellitus with mild nonproliferative diabetic retinopathy without macular edema, bilateral: Secondary | ICD-10-CM | POA: Diagnosis not present

## 2015-05-30 DIAGNOSIS — H43813 Vitreous degeneration, bilateral: Secondary | ICD-10-CM | POA: Diagnosis not present

## 2015-05-30 DIAGNOSIS — H35043 Retinal micro-aneurysms, unspecified, bilateral: Secondary | ICD-10-CM | POA: Diagnosis not present

## 2015-05-30 DIAGNOSIS — H2513 Age-related nuclear cataract, bilateral: Secondary | ICD-10-CM | POA: Diagnosis not present

## 2015-06-07 ENCOUNTER — Ambulatory Visit (INDEPENDENT_AMBULATORY_CARE_PROVIDER_SITE_OTHER): Payer: Medicare Other | Admitting: Pharmacist Clinician (PhC)/ Clinical Pharmacy Specialist

## 2015-06-07 DIAGNOSIS — I4891 Unspecified atrial fibrillation: Secondary | ICD-10-CM

## 2015-06-07 DIAGNOSIS — Z7901 Long term (current) use of anticoagulants: Secondary | ICD-10-CM

## 2015-06-07 LAB — POCT INR: INR: 3.1

## 2015-06-22 DIAGNOSIS — N183 Chronic kidney disease, stage 3 (moderate): Secondary | ICD-10-CM | POA: Diagnosis not present

## 2015-06-22 DIAGNOSIS — E0822 Diabetes mellitus due to underlying condition with diabetic chronic kidney disease: Secondary | ICD-10-CM | POA: Diagnosis not present

## 2015-06-22 DIAGNOSIS — M109 Gout, unspecified: Secondary | ICD-10-CM | POA: Diagnosis not present

## 2015-06-22 DIAGNOSIS — I129 Hypertensive chronic kidney disease with stage 1 through stage 4 chronic kidney disease, or unspecified chronic kidney disease: Secondary | ICD-10-CM | POA: Diagnosis not present

## 2015-06-22 DIAGNOSIS — E875 Hyperkalemia: Secondary | ICD-10-CM | POA: Diagnosis not present

## 2015-06-22 DIAGNOSIS — N2581 Secondary hyperparathyroidism of renal origin: Secondary | ICD-10-CM | POA: Diagnosis not present

## 2015-07-12 DIAGNOSIS — M25561 Pain in right knee: Secondary | ICD-10-CM | POA: Diagnosis not present

## 2015-07-19 ENCOUNTER — Ambulatory Visit (INDEPENDENT_AMBULATORY_CARE_PROVIDER_SITE_OTHER): Payer: Medicare Other | Admitting: Pharmacist Clinician (PhC)/ Clinical Pharmacy Specialist

## 2015-07-19 DIAGNOSIS — Z7901 Long term (current) use of anticoagulants: Secondary | ICD-10-CM

## 2015-07-19 DIAGNOSIS — I4891 Unspecified atrial fibrillation: Secondary | ICD-10-CM

## 2015-07-19 LAB — POCT INR: INR: 2.7

## 2015-07-25 ENCOUNTER — Telehealth: Payer: Self-pay | Admitting: Cardiology

## 2015-07-25 DIAGNOSIS — M25561 Pain in right knee: Secondary | ICD-10-CM | POA: Diagnosis not present

## 2015-07-25 NOTE — Telephone Encounter (Signed)
Spoke w/ pt and he stated that he still hasn't received his cellular adapter. Informed pt that I would take care of that for him today. Pt verbalized understanding.

## 2015-08-03 DIAGNOSIS — E113299 Type 2 diabetes mellitus with mild nonproliferative diabetic retinopathy without macular edema, unspecified eye: Secondary | ICD-10-CM | POA: Diagnosis not present

## 2015-08-03 DIAGNOSIS — R609 Edema, unspecified: Secondary | ICD-10-CM | POA: Diagnosis not present

## 2015-08-03 DIAGNOSIS — Z125 Encounter for screening for malignant neoplasm of prostate: Secondary | ICD-10-CM | POA: Diagnosis not present

## 2015-08-03 DIAGNOSIS — N183 Chronic kidney disease, stage 3 (moderate): Secondary | ICD-10-CM | POA: Diagnosis not present

## 2015-08-03 DIAGNOSIS — E782 Mixed hyperlipidemia: Secondary | ICD-10-CM | POA: Diagnosis not present

## 2015-08-03 DIAGNOSIS — I481 Persistent atrial fibrillation: Secondary | ICD-10-CM | POA: Diagnosis not present

## 2015-08-03 DIAGNOSIS — Z0000 Encounter for general adult medical examination without abnormal findings: Secondary | ICD-10-CM | POA: Diagnosis not present

## 2015-08-03 DIAGNOSIS — Z1389 Encounter for screening for other disorder: Secondary | ICD-10-CM | POA: Diagnosis not present

## 2015-08-03 DIAGNOSIS — E1122 Type 2 diabetes mellitus with diabetic chronic kidney disease: Secondary | ICD-10-CM | POA: Diagnosis not present

## 2015-08-03 DIAGNOSIS — Z23 Encounter for immunization: Secondary | ICD-10-CM | POA: Diagnosis not present

## 2015-08-03 DIAGNOSIS — I129 Hypertensive chronic kidney disease with stage 1 through stage 4 chronic kidney disease, or unspecified chronic kidney disease: Secondary | ICD-10-CM | POA: Diagnosis not present

## 2015-08-03 DIAGNOSIS — H3500 Unspecified background retinopathy: Secondary | ICD-10-CM | POA: Diagnosis not present

## 2015-08-04 DIAGNOSIS — M1711 Unilateral primary osteoarthritis, right knee: Secondary | ICD-10-CM | POA: Diagnosis not present

## 2015-08-09 DIAGNOSIS — M1711 Unilateral primary osteoarthritis, right knee: Secondary | ICD-10-CM | POA: Diagnosis not present

## 2015-08-12 ENCOUNTER — Telehealth: Payer: Self-pay | Admitting: Cardiology

## 2015-08-12 NOTE — Telephone Encounter (Signed)
Spoke w/ pt and asked if he had received his cellular adapter. Pt stated that he did and he is going to hook it up this weekend.

## 2015-08-12 NOTE — Telephone Encounter (Signed)
LMOVM for pt to return call 

## 2015-08-12 NOTE — Telephone Encounter (Signed)
Follow-up     Returning Barbara's call. No other information provided.

## 2015-08-16 DIAGNOSIS — M25561 Pain in right knee: Secondary | ICD-10-CM | POA: Diagnosis not present

## 2015-08-16 DIAGNOSIS — M1711 Unilateral primary osteoarthritis, right knee: Secondary | ICD-10-CM | POA: Diagnosis not present

## 2015-08-18 ENCOUNTER — Other Ambulatory Visit: Payer: Self-pay | Admitting: Cardiovascular Disease

## 2015-08-30 ENCOUNTER — Ambulatory Visit (INDEPENDENT_AMBULATORY_CARE_PROVIDER_SITE_OTHER): Payer: Medicare Other | Admitting: Pharmacist

## 2015-08-30 DIAGNOSIS — I4891 Unspecified atrial fibrillation: Secondary | ICD-10-CM | POA: Diagnosis not present

## 2015-08-30 DIAGNOSIS — Z7901 Long term (current) use of anticoagulants: Secondary | ICD-10-CM

## 2015-08-30 LAB — POCT INR: INR: 4.5

## 2015-09-06 ENCOUNTER — Other Ambulatory Visit: Payer: Self-pay | Admitting: Cardiovascular Disease

## 2015-09-27 ENCOUNTER — Ambulatory Visit (INDEPENDENT_AMBULATORY_CARE_PROVIDER_SITE_OTHER): Payer: Medicare Other | Admitting: Pharmacist Clinician (PhC)/ Clinical Pharmacy Specialist

## 2015-09-27 DIAGNOSIS — I4891 Unspecified atrial fibrillation: Secondary | ICD-10-CM

## 2015-09-27 DIAGNOSIS — Z7901 Long term (current) use of anticoagulants: Secondary | ICD-10-CM

## 2015-09-27 LAB — POCT INR: INR: 2.4

## 2015-10-03 ENCOUNTER — Encounter: Payer: Self-pay | Admitting: Cardiovascular Disease

## 2015-10-17 ENCOUNTER — Telehealth: Payer: Self-pay | Admitting: Cardiovascular Disease

## 2015-10-17 ENCOUNTER — Encounter: Payer: Self-pay | Admitting: Cardiovascular Disease

## 2015-10-17 NOTE — Telephone Encounter (Signed)
Closed encounter °

## 2015-11-01 ENCOUNTER — Ambulatory Visit: Payer: Medicare Other | Admitting: Cardiovascular Disease

## 2015-11-01 ENCOUNTER — Ambulatory Visit (INDEPENDENT_AMBULATORY_CARE_PROVIDER_SITE_OTHER): Payer: Medicare Other | Admitting: Pharmacist

## 2015-11-01 DIAGNOSIS — Z7901 Long term (current) use of anticoagulants: Secondary | ICD-10-CM

## 2015-11-01 DIAGNOSIS — I4891 Unspecified atrial fibrillation: Secondary | ICD-10-CM | POA: Diagnosis not present

## 2015-11-01 LAB — POCT INR: INR: 3

## 2015-11-09 ENCOUNTER — Ambulatory Visit (INDEPENDENT_AMBULATORY_CARE_PROVIDER_SITE_OTHER): Payer: Medicare Other | Admitting: Cardiovascular Disease

## 2015-11-09 ENCOUNTER — Encounter: Payer: Self-pay | Admitting: Cardiovascular Disease

## 2015-11-09 VITALS — BP 130/82 | HR 77 | Ht 70.0 in | Wt 176.2 lb

## 2015-11-09 DIAGNOSIS — E785 Hyperlipidemia, unspecified: Secondary | ICD-10-CM

## 2015-11-09 DIAGNOSIS — I519 Heart disease, unspecified: Secondary | ICD-10-CM | POA: Diagnosis not present

## 2015-11-09 DIAGNOSIS — R0989 Other specified symptoms and signs involving the circulatory and respiratory systems: Secondary | ICD-10-CM | POA: Diagnosis not present

## 2015-11-09 DIAGNOSIS — I10 Essential (primary) hypertension: Secondary | ICD-10-CM | POA: Diagnosis not present

## 2015-11-09 DIAGNOSIS — I429 Cardiomyopathy, unspecified: Secondary | ICD-10-CM | POA: Diagnosis not present

## 2015-11-09 DIAGNOSIS — I739 Peripheral vascular disease, unspecified: Secondary | ICD-10-CM

## 2015-11-09 NOTE — Progress Notes (Signed)
11/09/2015 Maxwell Fitzgerald   06/13/1950  [id number]  Primary Physician Maxwell Patella, MD Primary Cardiologist: Maxwell Gess MD Maxwell Fitzgerald  HPI:  [name] is a 65 year old married Caucasian male father of 2 daughters, grandfather and 2 grandchildren he was formally a patient of Maxwell Fitzgerald. 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 I last saw him in the office 11/24/12. 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 He has a history of coronary artery disease status post bypass grafting in 1998 with a LIMA to his LAD, vein to obtuse marginal branches 1, 2, and 3 as well as to the PDA and PLA. 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 Suspect is otherwise are remarkable for treated diabetes, hypertension, hyperlipidemia. He stopped smoking October of 1997. He does have a family history of heart disease with both parents who had bypass surgery. He was hospitalized in March 2014 with congestive heart failure and was diuresed. He also has chronic renal insufficiency followed by Maxwell Fitzgerald. He has chronic atrial fibrillation rate controlled on Coumadin anticoagulation. I referred him to Maxwell Fitzgerald tomorrow for consideration of biventricular ICD implantation. He ultimately had this performed at Maxwell Fitzgerald November 2014. He checks his quarterly at home regularly and also sees Maxwell Fitzgerald back yearly. He has not noticed any change in his symptoms. He had a 2-D echocardiogram performed 06/24/14 revealing an ejection fraction in the 25-30% range suggesting that he was was not a "CRT responder. He denies chest pain, shortness of breath or claudication.   Current Outpatient Prescriptions  Medication Sig Dispense Refill  . allopurinol (ZYLOPRIM) 100 MG tablet Take 100 mg by mouth 2 (two) times daily.     Marland Kitchen aspirin EC  81 MG tablet Take 81 mg by mouth at bedtime.    Marland Kitchen atorvastatin (LIPITOR) 20 MG tablet Take 1 tablet (20 mg total) by mouth daily at 6 PM. (Patient taking differently: Take 20 mg by mouth daily. ) 30 tablet 0  . calcitRIOL (ROCALTROL) 0.25 MCG capsule Take 0.25 mcg by mouth every other day. Three times per week, Monday, Wednesday and Friday    . colchicine 0.6 MG tablet Take 0.6 mg by mouth daily as needed (for flare ups).     . diltiazem (CARDIZEM) 60 MG tablet Take 60 mg by mouth 2 (two) times daily.    . furosemide (LASIX) 40 MG tablet Take 40 mg by mouth 2 (two) times daily.    Marland Kitchen glimepiride (AMARYL) 2 MG tablet Take 2-4 mg by mouth 2 (two) times daily. 2 mg each morning, 4 mg each evening    . HYDROcodone-acetaminophen (NORCO) 7.5-325 MG tablet Take 1-2 tablets by mouth every 4 (four) hours as needed for moderate pain. 100 tablet 0  . insulin glargine (LANTUS) 100 UNIT/ML injection Inject 8 Units into the skin daily. 10 units  in am 8 units in pm    . LORazepam (ATIVAN) 1 MG tablet Take 1 mg by mouth at bedtime.     Marland Kitchen losartan (COZAAR) 50 MG tablet Take 50 mg by mouth daily.     . methocarbamol (ROBAXIN) 500 MG tablet Take 1 tablet (500 mg total) by mouth every 6 (six) hours as needed for muscle spasms. 50 tablet 0  . metoprolol (LOPRESSOR) 50  MG tablet Take 1 tablet (50 mg total) by mouth 2 (two) times daily. 180 tablet 3  . metoprolol tartrate (LOPRESSOR) 25 MG tablet TAKE 3 TABLETS (75 MG TOTAL) BY MOUTH 2 (TWO) TIMES DAILY. 180 tablet 5  . Pediatric Multiple Vit-C-FA (CHILDRENS CHEWABLE MULTI VITS PO) Take 1 tablet by mouth daily.     . sildenafil (VIAGRA) 25 MG tablet Take 25 mg by mouth daily as needed for erectile dysfunction.    . sodium polystyrene (KAYEXALATE) 15 GM/60ML suspension Take 15 g by mouth every other day. 3 days a week    . warfarin (COUMADIN) 5 MG tablet TAKE 1 TO 1.5 TABLETS BY MOUTH DAILY AS DIRECTED BY COUMADIN CLINIC (Patient taking differently: Take 5-7.5 mg by mouth  daily. AS DIRECTED BY COUMADIN CLINIC --- 5 mg daily except Sunday and Thursday take 7.5 mg) 120 tablet 1  . warfarin (COUMADIN) 7.5 MG tablet TAKE ONE TO THREE TABLETS BY MOUTH WEEKLY AS DIRECTED BY COUMADIN CLINIC 36 tablet 0   No current facility-administered medications for this visit.     Allergies  Allergen Reactions  . Potassium Chloride Other (See Comments)    Kidney failure & heart EF issues  . Potassium-Containing Compounds     Kidney failure: Specifically Potassium Chloride  . Metformin And Related Other (See Comments)    Bothered liver     Social History   Social History  . Marital status: Married    Spouse name: N/A  . Number of children: N/A  . Years of education: N/A   Occupational History  . Not on file.   Social History Main Topics  . Smoking status: Former Smoker    Quit date: 05/20/1995  . Smokeless tobacco: Never Used  . Alcohol use Yes     Comment: 3 drinks per week   . Drug use: No     Comment: quit in 1972  . Sexual activity: Not on file   Other Topics Concern  . Not on file   Social History Narrative  . No narrative on file     Review of Systems: General: negative for chills, fever, night sweats or weight changes.  Cardiovascular: negative for chest pain, dyspnea on exertion, edema, orthopnea, palpitations, paroxysmal nocturnal dyspnea or shortness of breath Dermatological: negative for rash Respiratory: negative for cough or wheezing Urologic: negative for hematuria Abdominal: negative for nausea, vomiting, diarrhea, bright red blood per rectum, melena, or hematemesis Neurologic: negative for visual changes, syncope, or dizziness All other systems reviewed and are otherwise negative except as noted above.    Blood pressure 130/82, pulse 77, height 5\' 10"  (1.778 m), weight 176 lb 3.2 oz (79.9 kg), SpO2 97 %.  General appearance: alert and no distress Neck: no adenopathy, no JVD, supple, symmetrical, trachea midline, thyroid not  enlarged, symmetric, no tenderness/mass/nodules and Soft right carotid bruit Lungs: clear to auscultation bilaterally Heart: regular rate and rhythm, S1, S2 normal, no murmur, click, rub or gallop Extremities: extremities normal, atraumatic, no cyanosis or edema  EKG underlying atrial fibrillation with ventricular response of 77 with ventricular paced beats. Left bundle branch block. I personally reviewed this EKG  ASSESSMENT AND PLAN:   Coronary artery disease, hx of CABG 1998, LIMA-LAD;VG-OM1-OM2-OM3; VG-PDA/PLA History of CAD status post coronary artery bypass grafting in 1998 with a LIMA to his LAD, vein to obtuse marginal branch 1 and 2 and 3, and to the PDA and PLA. 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  He denies chest pain or shortness of breath. His last Myoview performed 05/28/12 showed a small area of inferoapical scar without ischemia.  Atrial fibrillation, permanent History of permanent atrial fibrillation on Coumadin anticoagulation rate controlled status post biventricular pacemaker insertion by Maxwell Fitzgerald.  Hypertension History of hypertension blood pressure measured 130/82. He is on diltiazem, losartan and Lopressor. Continue current meds at current dosing  Cardiomyopathy, EF 30-35% by echo 03/2012 History of ischemic cardiac myopathy status post biventricular pacemaker/ICD implantation by Dr. Ladona Ridgelaylor. The 2-D echo performed in May of last year's showed an EF of 25-30% unchanged from previous ejection fractions suggesting that he was not a "CRT" responder.  PAD (peripheral artery disease) History of peripheral arterial disease with Dopplers performed 06/24/14 revealing moderate SFA disease bilaterally. The patient really denies claudication.  Dyslipidemia History of hyperlipidemia on statin therapy followed by his PCP      Maxwell GessJonathan J. Mehlani Blankenburg MD [facility] VinelandFACP,FACC,FAHA, [facility] EndoFSCAI 11/09/2015 10:58 AM

## 2015-11-09 NOTE — Patient Instructions (Signed)
Medication Instructions:  NO CHANGES.  Labwork: Labwork will be requested from your primary care physician.   Testing/Procedures: Your physician has requested that you have an echocardiogram. Echocardiography is a painless test that uses sound waves to create images of your heart. It provides your doctor with information about the size and shape of your heart and how well your heart's chambers and valves are working. This procedure takes approximately one hour. There are no restrictions for this procedure.  Your physician has requested that you have a carotid duplex. This test is an ultrasound of the carotid arteries in your neck. It looks at blood flow through these arteries that supply the brain with blood. Allow one hour for this exam. There are no restrictions or special instructions.    Follow-Up: Your physician wants you to follow-up in: 12 MONTHS WITH DR BERRY.  You will receive a reminder letter in the mail two months in advance. If you don't receive a letter, please call our office to schedule the follow-up appointment.   If you need a refill on your cardiac medications before your next appointment, please call your pharmacy.   

## 2015-11-09 NOTE — Assessment & Plan Note (Signed)
History of permanent atrial fibrillation on Coumadin anticoagulation rate controlled status post biventricular pacemaker insertion by Dr. Sharrell Ku.

## 2015-11-09 NOTE — Assessment & Plan Note (Signed)
History of hypertension blood pressure measured 130/82. He is on diltiazem, losartan and Lopressor. Continue current meds at current dosing

## 2015-11-09 NOTE — Assessment & Plan Note (Signed)
History of CAD status post coronary artery bypass grafting in 1998 with a LIMA to his LAD, vein to obtuse marginal branch 1 and 2 and 3, and to the PDA and PLA. 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 He denies chest pain or shortness of breath. His last Myoview performed 05/28/12 showed a small area of inferoapical scar without ischemia.

## 2015-11-09 NOTE — Assessment & Plan Note (Signed)
History of ischemic cardiac myopathy status post biventricular pacemaker/ICD implantation by Dr. Ladona Ridgel. The 2-D echo performed in May of last year's showed an EF of 25-30% unchanged from previous ejection fractions suggesting that he was not a "CRT" responder.

## 2015-11-09 NOTE — Assessment & Plan Note (Signed)
History of hyperlipidemia on statin therapy followed by his PCP 

## 2015-11-09 NOTE — Assessment & Plan Note (Signed)
History of peripheral arterial disease with Dopplers performed 06/24/14 revealing moderate SFA disease bilaterally. The patient really denies claudication.

## 2015-11-15 DIAGNOSIS — Z23 Encounter for immunization: Secondary | ICD-10-CM | POA: Diagnosis not present

## 2015-11-16 ENCOUNTER — Other Ambulatory Visit: Payer: Self-pay | Admitting: Cardiovascular Disease

## 2015-11-16 DIAGNOSIS — I10 Essential (primary) hypertension: Secondary | ICD-10-CM

## 2015-11-16 DIAGNOSIS — R0989 Other specified symptoms and signs involving the circulatory and respiratory systems: Secondary | ICD-10-CM

## 2015-11-21 DIAGNOSIS — E1165 Type 2 diabetes mellitus with hyperglycemia: Secondary | ICD-10-CM | POA: Diagnosis not present

## 2015-11-21 DIAGNOSIS — Z794 Long term (current) use of insulin: Secondary | ICD-10-CM | POA: Diagnosis not present

## 2015-11-22 ENCOUNTER — Other Ambulatory Visit (HOSPITAL_COMMUNITY): Payer: Medicare Other

## 2015-11-23 ENCOUNTER — Inpatient Hospital Stay (HOSPITAL_COMMUNITY): Admission: RE | Admit: 2015-11-23 | Payer: Medicare Other | Source: Ambulatory Visit

## 2015-11-24 ENCOUNTER — Ambulatory Visit (HOSPITAL_COMMUNITY)
Admission: RE | Admit: 2015-11-24 | Discharge: 2015-11-24 | Disposition: A | Discharge status: Home / Self Care | Payer: Medicare Other | Source: Ambulatory Visit | Attending: Internal Medicine | Admitting: Internal Medicine

## 2015-11-24 DIAGNOSIS — I6523 Occlusion and stenosis of bilateral carotid arteries: Secondary | ICD-10-CM | POA: Diagnosis not present

## 2015-11-24 DIAGNOSIS — I10 Essential (primary) hypertension: Secondary | ICD-10-CM

## 2015-11-24 DIAGNOSIS — R0989 Other specified symptoms and signs involving the circulatory and respiratory systems: Secondary | ICD-10-CM

## 2015-11-30 ENCOUNTER — Telehealth: Payer: Self-pay | Admitting: *Deleted

## 2015-11-30 DIAGNOSIS — I6529 Occlusion and stenosis of unspecified carotid artery: Secondary | ICD-10-CM

## 2015-11-30 NOTE — Telephone Encounter (Signed)
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 Repeat in 12 months

## 2015-12-05 ENCOUNTER — Other Ambulatory Visit: Payer: Self-pay

## 2015-12-05 ENCOUNTER — Ambulatory Visit (HOSPITAL_COMMUNITY): Payer: Medicare Other | Attending: Cardiology

## 2015-12-05 DIAGNOSIS — R0989 Other specified symptoms and signs involving the circulatory and respiratory systems: Secondary | ICD-10-CM | POA: Diagnosis not present

## 2015-12-05 DIAGNOSIS — I519 Heart disease, unspecified: Secondary | ICD-10-CM | POA: Diagnosis not present

## 2015-12-05 DIAGNOSIS — I10 Essential (primary) hypertension: Secondary | ICD-10-CM

## 2015-12-07 ENCOUNTER — Telehealth: Payer: Self-pay | Admitting: Cardiology

## 2015-12-07 NOTE — Telephone Encounter (Signed)
Called pt to figure out why he returned his home monitor. Pt informed me that it is an old monitor that no longer works. He received a new one from [employer].

## 2015-12-12 ENCOUNTER — Ambulatory Visit (INDEPENDENT_AMBULATORY_CARE_PROVIDER_SITE_OTHER): Payer: Medicare Other | Admitting: Pharmacist

## 2015-12-12 DIAGNOSIS — I4891 Unspecified atrial fibrillation: Secondary | ICD-10-CM

## 2015-12-12 DIAGNOSIS — Z7901 Long term (current) use of anticoagulants: Secondary | ICD-10-CM

## 2015-12-12 LAB — POCT INR: INR: 4.4

## 2015-12-23 ENCOUNTER — Ambulatory Visit (INDEPENDENT_AMBULATORY_CARE_PROVIDER_SITE_OTHER): Payer: Medicare Other | Admitting: Pharmacist

## 2015-12-23 DIAGNOSIS — I4891 Unspecified atrial fibrillation: Secondary | ICD-10-CM | POA: Diagnosis not present

## 2015-12-23 DIAGNOSIS — I482 Chronic atrial fibrillation, unspecified: Secondary | ICD-10-CM

## 2015-12-23 DIAGNOSIS — Z7901 Long term (current) use of anticoagulants: Secondary | ICD-10-CM

## 2015-12-23 LAB — POCT INR: INR: 3.2

## 2016-01-06 ENCOUNTER — Telehealth: Payer: Self-pay | Admitting: Cardiology

## 2016-01-06 NOTE — Telephone Encounter (Signed)
Spoke w/ pt and requested that he send a manual transmission b/c his home monitor has not updated in at least 7 days.   

## 2016-01-23 ENCOUNTER — Encounter: Payer: Medicare Other | Admitting: Internal Medicine

## 2016-01-27 ENCOUNTER — Encounter: Payer: Self-pay | Admitting: Cardiovascular Disease

## 2016-01-27 DIAGNOSIS — E875 Hyperkalemia: Secondary | ICD-10-CM | POA: Diagnosis not present

## 2016-01-27 DIAGNOSIS — M109 Gout, unspecified: Secondary | ICD-10-CM | POA: Diagnosis not present

## 2016-01-27 DIAGNOSIS — E0822 Diabetes mellitus due to underlying condition with diabetic chronic kidney disease: Secondary | ICD-10-CM | POA: Diagnosis not present

## 2016-01-27 DIAGNOSIS — N2581 Secondary hyperparathyroidism of renal origin: Secondary | ICD-10-CM | POA: Diagnosis not present

## 2016-01-27 DIAGNOSIS — N183 Chronic kidney disease, stage 3 (moderate): Secondary | ICD-10-CM | POA: Diagnosis not present

## 2016-01-27 DIAGNOSIS — I129 Hypertensive chronic kidney disease with stage 1 through stage 4 chronic kidney disease, or unspecified chronic kidney disease: Secondary | ICD-10-CM | POA: Diagnosis not present

## 2016-01-29 ENCOUNTER — Other Ambulatory Visit: Payer: Self-pay | Admitting: Cardiovascular Disease

## 2016-02-03 ENCOUNTER — Ambulatory Visit (INDEPENDENT_AMBULATORY_CARE_PROVIDER_SITE_OTHER): Payer: Medicare Other | Admitting: Pharmacist

## 2016-02-03 DIAGNOSIS — I4891 Unspecified atrial fibrillation: Secondary | ICD-10-CM

## 2016-02-03 DIAGNOSIS — Z7901 Long term (current) use of anticoagulants: Secondary | ICD-10-CM | POA: Diagnosis not present

## 2016-02-03 DIAGNOSIS — I6529 Occlusion and stenosis of unspecified carotid artery: Secondary | ICD-10-CM | POA: Diagnosis not present

## 2016-02-03 LAB — POCT INR: INR: 2.8

## 2016-02-09 DIAGNOSIS — Z23 Encounter for immunization: Secondary | ICD-10-CM | POA: Diagnosis not present

## 2016-02-10 MED FILL — LANTUS SOLOSTAR 100 UNITS/M: 100 | 15 days supply | Qty: 3 | Fill #0

## 2016-02-28 ENCOUNTER — Ambulatory Visit (INDEPENDENT_AMBULATORY_CARE_PROVIDER_SITE_OTHER): Payer: Medicare Other | Admitting: *Deleted

## 2016-02-28 ENCOUNTER — Encounter: Payer: Self-pay | Admitting: Internal Medicine

## 2016-02-28 ENCOUNTER — Ambulatory Visit (INDEPENDENT_AMBULATORY_CARE_PROVIDER_SITE_OTHER): Payer: Medicare Other | Admitting: Internal Medicine

## 2016-02-28 VITALS — BP 130/70 | HR 74 | Ht 70.0 in | Wt 179.8 lb

## 2016-02-28 DIAGNOSIS — Z95810 Presence of automatic (implantable) cardiac defibrillator: Secondary | ICD-10-CM

## 2016-02-28 DIAGNOSIS — I5022 Chronic systolic (congestive) heart failure: Secondary | ICD-10-CM

## 2016-02-28 DIAGNOSIS — Z7901 Long term (current) use of anticoagulants: Secondary | ICD-10-CM

## 2016-02-28 DIAGNOSIS — I482 Chronic atrial fibrillation, unspecified: Secondary | ICD-10-CM

## 2016-02-28 DIAGNOSIS — I4891 Unspecified atrial fibrillation: Secondary | ICD-10-CM

## 2016-02-28 LAB — POCT INR: INR: 2.7

## 2016-02-28 MED ORDER — WARFARIN SODIUM 7.5 MG PO TABS: ORAL_TABLET | ORAL | 0 refills | Status: DC

## 2016-02-28 NOTE — Patient Instructions (Addendum)
Medication Instructions:  Your physician recommends that you continue on your current medications as directed. Please refer to the Current Medication list given to you today.   Labwork: None Ordered   Testing/Procedures: None Ordered   Follow-Up: Your physician wants you to follow-up in: 1 year with Dr. Taylor. You will receive a reminder letter in the mail two months in advance. If you don't receive a letter, please call our office to schedule the follow-up appointment.  Remote monitoring is used to monitor your ICD from home. This monitoring reduces the number of office visits required to check your device to one time per year. It allows us to keep an eye on the functioning of your device to ensure it is working properly. You are scheduled for a device check from home on 05/29/16. You may send your transmission at any time that day. If you have a wireless device, the transmission will be sent automatically. After your physician reviews your transmission, you will receive a postcard with your next transmission date.    Any Other Special Instructions Will Be Listed Below (If Applicable).     If you need a refill on your cardiac medications before your next appointment, please call your pharmacy.   
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
## 2016-04-11 ENCOUNTER — Ambulatory Visit (INDEPENDENT_AMBULATORY_CARE_PROVIDER_SITE_OTHER): Payer: Medicare Other | Admitting: Pharmacist Clinician (PhC)/ Clinical Pharmacy Specialist

## 2016-04-11 DIAGNOSIS — I4891 Unspecified atrial fibrillation: Secondary | ICD-10-CM | POA: Diagnosis not present

## 2016-04-11 DIAGNOSIS — I482 Chronic atrial fibrillation, unspecified: Secondary | ICD-10-CM

## 2016-04-11 DIAGNOSIS — Z7901 Long term (current) use of anticoagulants: Secondary | ICD-10-CM

## 2016-04-11 LAB — POCT INR: INR: 3.6

## 2016-05-04 ENCOUNTER — Telehealth: Payer: Self-pay | Admitting: Internal Medicine
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
## 2016-05-04 NOTE — Telephone Encounter (Signed)
Spoke with pt, pt agreeable to come in on 3/30 at 10:00 am to receive a new monitor and have it paired.

## 2016-05-09 ENCOUNTER — Ambulatory Visit (INDEPENDENT_AMBULATORY_CARE_PROVIDER_SITE_OTHER): Payer: Medicare Other | Admitting: Pharmacist Clinician (PhC)/ Clinical Pharmacy Specialist

## 2016-05-09 DIAGNOSIS — Z7901 Long term (current) use of anticoagulants: Secondary | ICD-10-CM

## 2016-05-09 DIAGNOSIS — I4891 Unspecified atrial fibrillation: Secondary | ICD-10-CM | POA: Diagnosis not present

## 2016-05-09 LAB — POCT INR: INR: 3.5

## 2016-05-18 ENCOUNTER — Ambulatory Visit (INDEPENDENT_AMBULATORY_CARE_PROVIDER_SITE_OTHER): Payer: Medicare Other | Admitting: *Deleted

## 2016-05-18 ENCOUNTER — Telehealth: Payer: Self-pay | Admitting: Cardiology

## 2016-05-18 DIAGNOSIS — Z95810 Presence of automatic (implantable) cardiac defibrillator: Secondary | ICD-10-CM

## 2016-05-18 DIAGNOSIS — I255 Ischemic cardiomyopathy: Secondary | ICD-10-CM

## 2016-05-18 NOTE — Progress Notes (Signed)
Assisted patient with pairing new Multimedia programmer.  Transmitter successfully paired, patient instructed to take transmitter and cellular adapter and plug in at home within 4-88ft of where he sleeps.  Printed instructions also given to patient.  Patient verbalizes understanding of instructions.

## 2016-05-18 NOTE — Telephone Encounter (Signed)
Spoke w/ pt and requested that he send a manual transmission b/c his home monitor has not updated in at least 7 days. He stated that he did it this morning.

## 2016-05-25 ENCOUNTER — Telehealth: Payer: Self-pay | Admitting: Cardiology

## 2016-05-25 NOTE — Telephone Encounter (Signed)
Attempted to call pt about his disconnected monitor. Pt stated that he knows he has a remote check on 05-29-16 and that he got 2 reminders about it already. When I went to explain to the patient that it appears that his monitor is not communicating with his device. Pt hang up on me. If pt appears on disconnected monitor I will send a letter to pt informing him of this information and request that he call tech support if he is sleeping near his monitor.

## 2016-05-29 ENCOUNTER — Ambulatory Visit (INDEPENDENT_AMBULATORY_CARE_PROVIDER_SITE_OTHER): Payer: Medicare Other | Admitting: *Deleted

## 2016-05-29 ENCOUNTER — Telehealth: Payer: Self-pay | Admitting: Cardiology

## 2016-05-29 DIAGNOSIS — I255 Ischemic cardiomyopathy: Secondary | ICD-10-CM

## 2016-05-29 NOTE — Telephone Encounter (Signed)
Spoke with pt and reminded pt of remote transmission that is due today. Pt verbalized understanding.   

## 2016-05-29 NOTE — Progress Notes (Signed)
Remote ICD transmission.   

## 2016-05-30 ENCOUNTER — Encounter: Payer: Self-pay | Admitting: Cardiology

## 2016-06-01 LAB — CUP PACEART REMOTE DEVICE CHECK
Battery Remaining Percentage: 55 %
Battery Voltage: 2.96 V
HIGH POWER IMPEDANCE MEASURED VALUE: 93 Ohm
HighPow Impedance: 93 Ohm
Implantable Lead Implant Date: 20141120
Implantable Lead Implant Date: 20141120
Implantable Lead Implant Date: 20141120
Implantable Lead Location: 753858
Implantable Lead Location: 753860
Implantable Lead Model: 7122
Lead Channel Impedance Value: 400 Ohm
Lead Channel Pacing Threshold Amplitude: 0.5 V
Lead Channel Pacing Threshold Amplitude: 1.25 V
Lead Channel Pacing Threshold Pulse Width: 0.8 ms
Lead Channel Sensing Intrinsic Amplitude: 1.1 mV
Lead Channel Sensing Intrinsic Amplitude: 11.7 mV
Lead Channel Setting Pacing Amplitude: 2.5 V
Lead Channel Setting Sensing Sensitivity: 0.5 mV
MDC IDC LEAD LOCATION: 753859
MDC IDC MSMT BATTERY REMAINING LONGEVITY: 44 mo
MDC IDC MSMT LEADCHNL LV IMPEDANCE VALUE: 400 Ohm
MDC IDC MSMT LEADCHNL RA IMPEDANCE VALUE: 400 Ohm
MDC IDC MSMT LEADCHNL RV PACING THRESHOLD PULSEWIDTH: 0.5 ms
MDC IDC PG IMPLANT DT: 20141120
MDC IDC SESS DTM: 20180410142542
MDC IDC SET LEADCHNL LV PACING PULSEWIDTH: 0.8 ms
MDC IDC SET LEADCHNL RV PACING AMPLITUDE: 2.5 V
MDC IDC SET LEADCHNL RV PACING PULSEWIDTH: 0.5 ms
Pulse Gen Serial Number: 7133629

## 2016-06-04 ENCOUNTER — Ambulatory Visit (INDEPENDENT_AMBULATORY_CARE_PROVIDER_SITE_OTHER): Payer: Medicare Other | Admitting: Pharmacist Clinician (PhC)/ Clinical Pharmacy Specialist

## 2016-06-04 DIAGNOSIS — I4891 Unspecified atrial fibrillation: Secondary | ICD-10-CM | POA: Diagnosis not present

## 2016-06-04 DIAGNOSIS — Z7901 Long term (current) use of anticoagulants: Secondary | ICD-10-CM

## 2016-06-04 DIAGNOSIS — I255 Ischemic cardiomyopathy: Secondary | ICD-10-CM | POA: Diagnosis not present

## 2016-06-04 LAB — POCT INR: INR: 3.6

## 2016-06-14 ENCOUNTER — Encounter: Payer: Self-pay | Admitting: Cardiology

## 2016-06-25 ENCOUNTER — Ambulatory Visit (INDEPENDENT_AMBULATORY_CARE_PROVIDER_SITE_OTHER): Payer: Medicare Other | Admitting: Pharmacist

## 2016-06-25 DIAGNOSIS — I255 Ischemic cardiomyopathy: Secondary | ICD-10-CM

## 2016-06-25 DIAGNOSIS — I4891 Unspecified atrial fibrillation: Secondary | ICD-10-CM | POA: Diagnosis not present

## 2016-06-25 DIAGNOSIS — Z7901 Long term (current) use of anticoagulants: Secondary | ICD-10-CM

## 2016-06-25 LAB — POCT INR: INR: 2.1

## 2016-07-09 DIAGNOSIS — I129 Hypertensive chronic kidney disease with stage 1 through stage 4 chronic kidney disease, or unspecified chronic kidney disease: Secondary | ICD-10-CM | POA: Diagnosis not present

## 2016-07-09 DIAGNOSIS — N183 Chronic kidney disease, stage 3 (moderate): Secondary | ICD-10-CM | POA: Diagnosis not present

## 2016-07-09 DIAGNOSIS — F419 Anxiety disorder, unspecified: Secondary | ICD-10-CM | POA: Diagnosis not present

## 2016-07-09 DIAGNOSIS — I5022 Chronic systolic (congestive) heart failure: Secondary | ICD-10-CM | POA: Diagnosis not present

## 2016-07-09 DIAGNOSIS — E113299 Type 2 diabetes mellitus with mild nonproliferative diabetic retinopathy without macular edema, unspecified eye: Secondary | ICD-10-CM | POA: Diagnosis not present

## 2016-07-09 DIAGNOSIS — E782 Mixed hyperlipidemia: Secondary | ICD-10-CM | POA: Diagnosis not present

## 2016-07-09 DIAGNOSIS — H3500 Unspecified background retinopathy: Secondary | ICD-10-CM | POA: Diagnosis not present

## 2016-07-09 DIAGNOSIS — Z794 Long term (current) use of insulin: Secondary | ICD-10-CM | POA: Diagnosis not present

## 2016-07-09 DIAGNOSIS — I481 Persistent atrial fibrillation: Secondary | ICD-10-CM | POA: Diagnosis not present

## 2016-07-09 DIAGNOSIS — R609 Edema, unspecified: Secondary | ICD-10-CM | POA: Diagnosis not present

## 2016-07-09 DIAGNOSIS — Z6827 Body mass index (BMI) 27.0-27.9, adult: Secondary | ICD-10-CM | POA: Diagnosis not present

## 2016-07-09 DIAGNOSIS — E1122 Type 2 diabetes mellitus with diabetic chronic kidney disease: Secondary | ICD-10-CM | POA: Diagnosis not present

## 2016-07-17 DIAGNOSIS — E113292 Type 2 diabetes mellitus with mild nonproliferative diabetic retinopathy without macular edema, left eye: Secondary | ICD-10-CM | POA: Diagnosis not present

## 2016-07-17 DIAGNOSIS — H43813 Vitreous degeneration, bilateral: Secondary | ICD-10-CM | POA: Diagnosis not present

## 2016-07-17 DIAGNOSIS — H35043 Retinal micro-aneurysms, unspecified, bilateral: Secondary | ICD-10-CM | POA: Diagnosis not present

## 2016-07-17 DIAGNOSIS — E113291 Type 2 diabetes mellitus with mild nonproliferative diabetic retinopathy without macular edema, right eye: Secondary | ICD-10-CM | POA: Diagnosis not present

## 2016-07-30 ENCOUNTER — Telehealth: Payer: Self-pay | Admitting: Cardiology

## 2016-07-30 NOTE — Telephone Encounter (Signed)
LMOVM requesting that pt send manual transmission b/c home monitor has not updated in at least 7 days.    

## 2016-08-09 ENCOUNTER — Ambulatory Visit (INDEPENDENT_AMBULATORY_CARE_PROVIDER_SITE_OTHER): Payer: Medicare Other | Admitting: Pharmacist Clinician (PhC)/ Clinical Pharmacy Specialist

## 2016-08-09 DIAGNOSIS — I4891 Unspecified atrial fibrillation: Secondary | ICD-10-CM | POA: Diagnosis not present

## 2016-08-09 DIAGNOSIS — Z7901 Long term (current) use of anticoagulants: Secondary | ICD-10-CM

## 2016-08-09 DIAGNOSIS — I255 Ischemic cardiomyopathy: Secondary | ICD-10-CM

## 2016-08-09 LAB — POCT INR: INR: 2

## 2016-08-24 ENCOUNTER — Telehealth: Payer: Self-pay | Admitting: Cardiology

## 2016-08-24 NOTE — Telephone Encounter (Signed)
LMOVM requesting that pt send manual transmission b/c home monitor has not updated in at least 7 days.    

## 2016-08-28 ENCOUNTER — Ambulatory Visit (INDEPENDENT_AMBULATORY_CARE_PROVIDER_SITE_OTHER): Payer: Medicare Other | Admitting: *Deleted

## 2016-08-28 ENCOUNTER — Telehealth: Payer: Self-pay | Admitting: Cardiology

## 2016-08-28 DIAGNOSIS — I255 Ischemic cardiomyopathy: Secondary | ICD-10-CM

## 2016-08-28 NOTE — Telephone Encounter (Signed)
Spoke with pt and reminded pt of remote transmission that is due today. Pt verbalized understanding.   

## 2016-08-29 NOTE — Progress Notes (Signed)
Remote ICD transmission.   

## 2016-08-30 LAB — CUP PACEART REMOTE DEVICE CHECK
Battery Remaining Percentage: 52 %
Battery Voltage: 2.96 V
HIGH POWER IMPEDANCE MEASURED VALUE: 84 Ohm
HighPow Impedance: 84 Ohm
Implantable Lead Implant Date: 20141120
Implantable Lead Location: 753860
Implantable Lead Model: 7122
Implantable Pulse Generator Implant Date: 20141120
Lead Channel Impedance Value: 390 Ohm
Lead Channel Impedance Value: 410 Ohm
Lead Channel Impedance Value: 430 Ohm
Lead Channel Pacing Threshold Amplitude: 0.5 V
Lead Channel Pacing Threshold Amplitude: 1.25 V
Lead Channel Pacing Threshold Pulse Width: 0.5 ms
Lead Channel Sensing Intrinsic Amplitude: 1.1 mV
Lead Channel Sensing Intrinsic Amplitude: 11.7 mV
Lead Channel Setting Pacing Amplitude: 2.5 V
Lead Channel Setting Pacing Amplitude: 2.5 V
Lead Channel Setting Pacing Pulse Width: 0.5 ms
MDC IDC LEAD IMPLANT DT: 20141120
MDC IDC LEAD IMPLANT DT: 20141120
MDC IDC LEAD LOCATION: 753858
MDC IDC LEAD LOCATION: 753859
MDC IDC MSMT BATTERY REMAINING LONGEVITY: 43 mo
MDC IDC MSMT LEADCHNL LV PACING THRESHOLD PULSEWIDTH: 0.8 ms
MDC IDC PG SERIAL: 7133629
MDC IDC SESS DTM: 20180710171903
MDC IDC SET LEADCHNL LV PACING PULSEWIDTH: 0.8 ms
MDC IDC SET LEADCHNL RV SENSING SENSITIVITY: 0.5 mV

## 2016-09-01 ENCOUNTER — Other Ambulatory Visit: Payer: Self-pay | Admitting: Cardiovascular Disease

## 2016-09-04 ENCOUNTER — Encounter: Payer: Self-pay | Admitting: Cardiology

## 2016-09-21 ENCOUNTER — Ambulatory Visit (INDEPENDENT_AMBULATORY_CARE_PROVIDER_SITE_OTHER): Payer: Medicare Other | Admitting: Pharmacist Clinician (PhC)/ Clinical Pharmacy Specialist

## 2016-09-21 DIAGNOSIS — Z7901 Long term (current) use of anticoagulants: Secondary | ICD-10-CM

## 2016-09-21 DIAGNOSIS — I4891 Unspecified atrial fibrillation: Secondary | ICD-10-CM

## 2016-09-21 DIAGNOSIS — I255 Ischemic cardiomyopathy: Secondary | ICD-10-CM

## 2016-09-21 LAB — POCT INR: INR: 2.8

## 2016-09-27 DIAGNOSIS — N2581 Secondary hyperparathyroidism of renal origin: Secondary | ICD-10-CM | POA: Diagnosis not present

## 2016-09-27 DIAGNOSIS — N183 Chronic kidney disease, stage 3 (moderate): Secondary | ICD-10-CM | POA: Diagnosis not present

## 2016-09-27 DIAGNOSIS — E0822 Diabetes mellitus due to underlying condition with diabetic chronic kidney disease: Secondary | ICD-10-CM | POA: Diagnosis not present

## 2016-09-27 DIAGNOSIS — M109 Gout, unspecified: Secondary | ICD-10-CM | POA: Diagnosis not present

## 2016-09-27 DIAGNOSIS — I129 Hypertensive chronic kidney disease with stage 1 through stage 4 chronic kidney disease, or unspecified chronic kidney disease: Secondary | ICD-10-CM | POA: Diagnosis not present

## 2016-09-27 DIAGNOSIS — E875 Hyperkalemia: Secondary | ICD-10-CM | POA: Diagnosis not present

## 2016-10-02 ENCOUNTER — Telehealth: Payer: Self-pay | Admitting: Cardiovascular Disease

## 2016-10-02 NOTE — Telephone Encounter (Signed)
Mr.Conkle is calling because he is having severe issues with the transmitter . Please call

## 2016-10-02 NOTE — Telephone Encounter (Signed)
Patient states that he was confused bc he received a call last week to send a manual transmission from his Merlin monitor bc it had become disconnected. I explained to patient that since he has a recall device he will receive phone calls to reconnect the monitor if it becomes disconnected. Pt states that he tried to send a manual transmission this am but it was unsuccessful. I told patient that he could call me once he returns home and I would try to help him send a manual transmission. Patient verbalized understanding and plans to call back.

## 2016-10-03 NOTE — Telephone Encounter (Signed)
Pt's home monitor connected. Last communication date 10-01-2016.

## 2016-10-09 DIAGNOSIS — N529 Male erectile dysfunction, unspecified: Secondary | ICD-10-CM | POA: Diagnosis not present

## 2016-10-09 DIAGNOSIS — E1122 Type 2 diabetes mellitus with diabetic chronic kidney disease: Secondary | ICD-10-CM | POA: Diagnosis not present

## 2016-10-09 DIAGNOSIS — E113299 Type 2 diabetes mellitus with mild nonproliferative diabetic retinopathy without macular edema, unspecified eye: Secondary | ICD-10-CM | POA: Diagnosis not present

## 2016-10-09 DIAGNOSIS — Z1389 Encounter for screening for other disorder: Secondary | ICD-10-CM | POA: Diagnosis not present

## 2016-10-09 DIAGNOSIS — G4700 Insomnia, unspecified: Secondary | ICD-10-CM | POA: Diagnosis not present

## 2016-10-09 DIAGNOSIS — E1069 Type 1 diabetes mellitus with other specified complication: Secondary | ICD-10-CM | POA: Diagnosis not present

## 2016-10-09 DIAGNOSIS — Z794 Long term (current) use of insulin: Secondary | ICD-10-CM | POA: Diagnosis not present

## 2016-10-09 DIAGNOSIS — Z7984 Long term (current) use of oral hypoglycemic drugs: Secondary | ICD-10-CM | POA: Diagnosis not present

## 2016-10-09 DIAGNOSIS — E1165 Type 2 diabetes mellitus with hyperglycemia: Secondary | ICD-10-CM | POA: Diagnosis not present

## 2016-10-09 DIAGNOSIS — N183 Chronic kidney disease, stage 3 (moderate): Secondary | ICD-10-CM | POA: Diagnosis not present

## 2016-10-11 DIAGNOSIS — N5201 Erectile dysfunction due to arterial insufficiency: Secondary | ICD-10-CM | POA: Diagnosis not present

## 2016-10-19 DIAGNOSIS — N5201 Erectile dysfunction due to arterial insufficiency: Secondary | ICD-10-CM | POA: Diagnosis not present

## 2016-10-24 ENCOUNTER — Telehealth: Payer: Self-pay | Admitting: Cardiology

## 2016-10-24 NOTE — Telephone Encounter (Signed)
Spoke w/ pt and requested that he send a manual transmission b/c his home monitor has not updated in at least 14 days.   

## 2016-10-30 ENCOUNTER — Encounter: Payer: Self-pay | Admitting: Cardiovascular Disease

## 2016-10-30 ENCOUNTER — Ambulatory Visit (INDEPENDENT_AMBULATORY_CARE_PROVIDER_SITE_OTHER): Payer: Medicare Other | Admitting: Pharmacist Clinician (PhC)/ Clinical Pharmacy Specialist

## 2016-10-30 ENCOUNTER — Ambulatory Visit (INDEPENDENT_AMBULATORY_CARE_PROVIDER_SITE_OTHER): Payer: Medicare Other | Admitting: Cardiovascular Disease

## 2016-10-30 VITALS — BP 140/78 | HR 78 | Ht 70.0 in | Wt 193.0 lb

## 2016-10-30 DIAGNOSIS — E785 Hyperlipidemia, unspecified: Secondary | ICD-10-CM | POA: Diagnosis not present

## 2016-10-30 DIAGNOSIS — I4891 Unspecified atrial fibrillation: Secondary | ICD-10-CM

## 2016-10-30 DIAGNOSIS — I739 Peripheral vascular disease, unspecified: Secondary | ICD-10-CM | POA: Diagnosis not present

## 2016-10-30 DIAGNOSIS — I482 Chronic atrial fibrillation, unspecified: Secondary | ICD-10-CM

## 2016-10-30 DIAGNOSIS — I255 Ischemic cardiomyopathy: Secondary | ICD-10-CM

## 2016-10-30 DIAGNOSIS — I10 Essential (primary) hypertension: Secondary | ICD-10-CM

## 2016-10-30 DIAGNOSIS — I2589 Other forms of chronic ischemic heart disease: Secondary | ICD-10-CM | POA: Diagnosis not present

## 2016-10-30 DIAGNOSIS — Z7901 Long term (current) use of anticoagulants: Secondary | ICD-10-CM | POA: Diagnosis not present

## 2016-10-30 LAB — POCT INR: INR: 1.4

## 2016-10-30 NOTE — Assessment & Plan Note (Addendum)
History of peripheral arterial disease with mild to moderate right SFA stenosis although he denies claudication.

## 2016-10-30 NOTE — Assessment & Plan Note (Signed)
History of CAD status post bypass grafting in 1998 the LIMA to his LAD, vein to obtuse marginal branch 12 and 3 as well as the PDA and PLA with ischemic cardiomyopathy. His EF has been in the 3035% range. He does have a bifid AICD but was not a "CRT responder. He denies symptoms of chest pain or shortness of breath.

## 2016-10-30 NOTE — Assessment & Plan Note (Signed)
History of essential hypertension blood pressure measures 140/78. He is on diltiazem, losartan and metoprolol. Continue current meds at current dosing

## 2016-10-30 NOTE — Patient Instructions (Signed)
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
## 2016-10-30 NOTE — Addendum Note (Signed)
Addended by: Evans Lance on: 10/30/2016 10:07 AM   Modules accepted: Orders

## 2016-10-30 NOTE — Assessment & Plan Note (Signed)
History of dyslipidemia on statin therapy.  We will recheck a lipid and liver profile. 

## 2016-10-30 NOTE — Assessment & Plan Note (Signed)
History of permanent atrial fibrillation rate controlled on Coumadin anticoagulation.

## 2016-10-30 NOTE — Assessment & Plan Note (Signed)
History of ischemic cardiomyopathy appropriate medications status post IV ICD implantation not a CRT responder. He has minimal symptoms of heart failure.

## 2016-11-07 DIAGNOSIS — Z23 Encounter for immunization: Secondary | ICD-10-CM | POA: Diagnosis not present

## 2016-11-27 ENCOUNTER — Encounter: Payer: Medicare Other | Admitting: *Deleted

## 2016-11-27 ENCOUNTER — Telehealth: Payer: Self-pay | Admitting: Cardiology

## 2016-11-27 NOTE — Telephone Encounter (Signed)
Spoke with pt and reminded pt of remote transmission that is due today. Pt verbalized understanding.   

## 2016-11-30 ENCOUNTER — Encounter: Payer: Self-pay | Admitting: Cardiology

## 2016-11-30 ENCOUNTER — Ambulatory Visit (INDEPENDENT_AMBULATORY_CARE_PROVIDER_SITE_OTHER): Payer: Medicare Other | Admitting: Pharmacist Clinician (PhC)/ Clinical Pharmacy Specialist

## 2016-11-30 DIAGNOSIS — Z7901 Long term (current) use of anticoagulants: Secondary | ICD-10-CM

## 2016-11-30 DIAGNOSIS — I482 Chronic atrial fibrillation, unspecified: Secondary | ICD-10-CM

## 2016-11-30 DIAGNOSIS — I4891 Unspecified atrial fibrillation: Secondary | ICD-10-CM

## 2016-11-30 LAB — POCT INR: INR: 1.2

## 2016-12-06 ENCOUNTER — Telehealth: Payer: Self-pay | Admitting: Cardiology
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
## 2016-12-07 ENCOUNTER — Ambulatory Visit (INDEPENDENT_AMBULATORY_CARE_PROVIDER_SITE_OTHER): Payer: Medicare Other | Admitting: *Deleted

## 2016-12-07 DIAGNOSIS — I255 Ischemic cardiomyopathy: Secondary | ICD-10-CM | POA: Diagnosis not present

## 2016-12-10 NOTE — Progress Notes (Signed)
Remote ICD transmission.   

## 2016-12-13 ENCOUNTER — Encounter: Payer: Self-pay | Admitting: Cardiology

## 2016-12-14 ENCOUNTER — Other Ambulatory Visit: Payer: Self-pay | Admitting: Cardiovascular Disease

## 2016-12-28 ENCOUNTER — Other Ambulatory Visit: Payer: Self-pay | Admitting: Cardiovascular Disease

## 2016-12-28 ENCOUNTER — Ambulatory Visit (INDEPENDENT_AMBULATORY_CARE_PROVIDER_SITE_OTHER): Payer: Medicare Other | Admitting: Pharmacist

## 2016-12-28 DIAGNOSIS — I4891 Unspecified atrial fibrillation: Secondary | ICD-10-CM

## 2016-12-28 DIAGNOSIS — Z7901 Long term (current) use of anticoagulants: Secondary | ICD-10-CM | POA: Diagnosis not present

## 2016-12-28 DIAGNOSIS — R0989 Other specified symptoms and signs involving the circulatory and respiratory systems: Secondary | ICD-10-CM

## 2016-12-28 DIAGNOSIS — I482 Chronic atrial fibrillation, unspecified: Secondary | ICD-10-CM

## 2016-12-28 LAB — CUP PACEART REMOTE DEVICE CHECK
Implantable Lead Implant Date: 20141120
Implantable Lead Implant Date: 20141120
Implantable Lead Location: 753858
Implantable Lead Location: 753860
Implantable Lead Model: 7122
Lead Channel Setting Pacing Amplitude: 2.5 V
Lead Channel Setting Pacing Amplitude: 2.5 V
MDC IDC LEAD IMPLANT DT: 20141120
MDC IDC LEAD LOCATION: 753859
MDC IDC PG IMPLANT DT: 20141120
MDC IDC SESS DTM: 20181109142712
MDC IDC SET LEADCHNL LV PACING PULSEWIDTH: 0.8 ms
MDC IDC SET LEADCHNL RV PACING PULSEWIDTH: 0.5 ms
MDC IDC SET LEADCHNL RV SENSING SENSITIVITY: 0.5 mV
Pulse Gen Serial Number: 7133629

## 2016-12-28 LAB — POCT INR: INR: 2

## 2016-12-28 NOTE — Progress Notes (Signed)
Continue taking 5 mg daily except 7.5 mg on Sundays and Fridays.  Repeat INR in 6 weeks

## 2017-01-16 ENCOUNTER — Telehealth: Payer: Self-pay | Admitting: Internal Medicine

## 2017-01-16 NOTE — Telephone Encounter (Signed)
°  New Prob  Pt has a question regarding his upcoming remote check as he just scheduled a defib check with Dr. Ladona Ridgel. Please call.

## 2017-01-16 NOTE — Telephone Encounter (Signed)
pt cancelled apt d/t seeing GT on 1/29 pt doesnt want insurance billed twice.

## 2017-01-30 DIAGNOSIS — Z1389 Encounter for screening for other disorder: Secondary | ICD-10-CM | POA: Diagnosis not present

## 2017-01-30 DIAGNOSIS — E782 Mixed hyperlipidemia: Secondary | ICD-10-CM | POA: Diagnosis not present

## 2017-01-30 DIAGNOSIS — D509 Iron deficiency anemia, unspecified: Secondary | ICD-10-CM | POA: Diagnosis not present

## 2017-01-30 DIAGNOSIS — Z6827 Body mass index (BMI) 27.0-27.9, adult: Secondary | ICD-10-CM | POA: Diagnosis not present

## 2017-01-30 DIAGNOSIS — L309 Dermatitis, unspecified: Secondary | ICD-10-CM | POA: Diagnosis not present

## 2017-01-30 DIAGNOSIS — G4700 Insomnia, unspecified: Secondary | ICD-10-CM | POA: Diagnosis not present

## 2017-01-30 DIAGNOSIS — Z0000 Encounter for general adult medical examination without abnormal findings: Secondary | ICD-10-CM | POA: Diagnosis not present

## 2017-01-30 DIAGNOSIS — E1022 Type 1 diabetes mellitus with diabetic chronic kidney disease: Secondary | ICD-10-CM | POA: Diagnosis not present

## 2017-01-30 DIAGNOSIS — N529 Male erectile dysfunction, unspecified: Secondary | ICD-10-CM | POA: Diagnosis not present

## 2017-01-30 DIAGNOSIS — N183 Chronic kidney disease, stage 3 (moderate): Secondary | ICD-10-CM | POA: Diagnosis not present

## 2017-01-30 DIAGNOSIS — I481 Persistent atrial fibrillation: Secondary | ICD-10-CM | POA: Diagnosis not present

## 2017-01-30 DIAGNOSIS — I10 Essential (primary) hypertension: Secondary | ICD-10-CM | POA: Diagnosis not present

## 2017-01-30 DIAGNOSIS — Z794 Long term (current) use of insulin: Secondary | ICD-10-CM | POA: Diagnosis not present

## 2017-02-08 ENCOUNTER — Ambulatory Visit (HOSPITAL_COMMUNITY)
Admission: RE | Admit: 2017-02-08 | Discharge: 2017-02-08 | Disposition: A | Discharge status: Home / Self Care | Payer: Medicare Other | Source: Ambulatory Visit | Attending: Cardiovascular Disease | Admitting: Cardiovascular Disease

## 2017-02-08 ENCOUNTER — Ambulatory Visit (INDEPENDENT_AMBULATORY_CARE_PROVIDER_SITE_OTHER): Payer: Medicare Other | Admitting: Pharmacist Clinician (PhC)/ Clinical Pharmacy Specialist

## 2017-02-08 DIAGNOSIS — I482 Chronic atrial fibrillation, unspecified: Secondary | ICD-10-CM

## 2017-02-08 DIAGNOSIS — Z7901 Long term (current) use of anticoagulants: Secondary | ICD-10-CM | POA: Diagnosis not present

## 2017-02-08 DIAGNOSIS — I6529 Occlusion and stenosis of unspecified carotid artery: Secondary | ICD-10-CM | POA: Insufficient documentation

## 2017-02-08 LAB — POCT INR: INR: 2.5

## 2017-02-08 NOTE — Patient Instructions (Addendum)
  Description   Continue taking 5 mg daily except 7.5 mg on Sundays and Thursdays.  Repeat INR in 6 weeks

## 2017-02-18 ENCOUNTER — Other Ambulatory Visit: Payer: Self-pay | Admitting: Cardiovascular Disease

## 2017-02-18 DIAGNOSIS — I6529 Occlusion and stenosis of unspecified carotid artery: Secondary | ICD-10-CM

## 2017-03-12 DIAGNOSIS — N5201 Erectile dysfunction due to arterial insufficiency: Secondary | ICD-10-CM | POA: Diagnosis not present

## 2017-03-18 ENCOUNTER — Telehealth: Payer: Self-pay | Admitting: Cardiovascular Disease

## 2017-03-18 NOTE — Telephone Encounter (Signed)
° °  [facility] Pre-operative Risk Assessment    Request for surgical clearance:  1. What type of surgery is being performed? Inflatable Penile Prosthersis   2. When is this surgery scheduled? Pending  3. What type of clearance is required (medical clearance vs. Pharmacy clearance to hold med vs. Both)? General Cardiac Clearancerance are there any medications that need to be held prior to surgery and how long?Can pt stop Coumadin,If so,how long?   4. Practice name and name of physician performing surgery? Dr Kathie Rhodes   5. What is your office phone and fax number? [phone] and fax is [phone]   6. Anesthesia type (None, local, MAC, general) ? General   Glyn Ade 03/18/2017, 4:50 PM  _________________________________________________________________   (provider comments below)

## 2017-03-19 ENCOUNTER — Encounter: Payer: Medicare Other | Admitting: Internal Medicine

## 2017-03-19 NOTE — Telephone Encounter (Signed)
Patient with diagnosis of Afib on warfarin for anticoagulation.    Procedure: inflatable penile prosthesis Date of procedure: pending  CHADS2-VASc score of  5 (CHF, HTN, AGE, DM2, stroke/tia x 2, CAD, AGE, male)  Per office protocol, patient can hold warfarin for 5 days prior to procedure.   Patient will not need bridging with Lovenox (enoxaparin) around procedure.

## 2017-03-19 NOTE — Telephone Encounter (Signed)
   Primary Cardiologist: No primary care provider on file.  Chart reviewed as part of pre-operative protocol coverage. Patient was contacted 03/19/2017 in reference to pre-operative risk assessment for pending surgery as outlined below.  Maxwell Fitzgerald was last seen on 10/30/2016  by Dr. Allyson Sabal.  Left voice mail to call back between pre-op hours (1:30-5pm). Will route to pharmacy to review anticoagulation.   [name], [place] 03/19/2017, 12:22 PM

## 2017-03-20 NOTE — Telephone Encounter (Signed)
I s/w Junious Dresser with Dr. Margrett Rud office, advised clearance letter faxed via epic. Junious Dresser did ask if I could send over hard copies as well. I confirmed fax . Junious Dresser thanked me for my call .

## 2017-03-20 NOTE — Telephone Encounter (Addendum)
  Pre-operative Risk Assessment - Provider Statement    Patient ID:  Maxwell Fitzgerald, DOB: [birth year], MRN: [id number]   [name]'s chart has been reviewed for pre-operative risk assessment.    The patient was contacted 03/20/2017 in reference to pre-operative risk assessment for pending surgery as outlined.  He was last seen on 10/30/16 by Dr. Allyson Sabal.  Since that time, he has done well without any anginal symptoms or symptoms of decompensated heart failure.  According to ACC/AHA guidelines, he does not require further testing and may proceed with surgery at acceptable risk.  Our cardiovascular risk reduction clinic has reviewed his chart.  He may hold Coumadin for 5 days prior to his procedure.  A letter has been faxed to his surgeon providing clearance.  Please contact the surgeon's office and ensure that the fax was received.  Signed,  Tereso Newcomer, PA-C  03/20/2017 3:10 PM

## 2017-03-25 ENCOUNTER — Other Ambulatory Visit: Payer: Self-pay | Admitting: Urology

## 2017-04-02 ENCOUNTER — Ambulatory Visit (INDEPENDENT_AMBULATORY_CARE_PROVIDER_SITE_OTHER): Payer: Medicare Other | Admitting: Internal Medicine

## 2017-04-02 ENCOUNTER — Ambulatory Visit (INDEPENDENT_AMBULATORY_CARE_PROVIDER_SITE_OTHER): Payer: Medicare Other | Admitting: *Deleted

## 2017-04-02 ENCOUNTER — Encounter: Payer: Self-pay | Admitting: Internal Medicine

## 2017-04-02 VITALS — BP 126/82 | HR 80 | Ht 70.0 in | Wt 200.9 lb

## 2017-04-02 DIAGNOSIS — Z95810 Presence of automatic (implantable) cardiac defibrillator: Secondary | ICD-10-CM | POA: Diagnosis not present

## 2017-04-02 DIAGNOSIS — I482 Chronic atrial fibrillation, unspecified: Secondary | ICD-10-CM

## 2017-04-02 DIAGNOSIS — Z7901 Long term (current) use of anticoagulants: Secondary | ICD-10-CM | POA: Diagnosis not present

## 2017-04-02 DIAGNOSIS — I255 Ischemic cardiomyopathy: Secondary | ICD-10-CM | POA: Diagnosis not present

## 2017-04-02 DIAGNOSIS — I5022 Chronic systolic (congestive) heart failure: Secondary | ICD-10-CM | POA: Diagnosis not present

## 2017-04-02 DIAGNOSIS — I2589 Other forms of chronic ischemic heart disease: Secondary | ICD-10-CM

## 2017-04-02 LAB — CUP PACEART INCLINIC DEVICE CHECK
Brady Statistic RA Percent Paced: 0 %
Brady Statistic RV Percent Paced: 51 %
Date Time Interrogation Session: 20190212105940
HighPow Impedance: 78.75 Ohm
Implantable Lead Implant Date: 20141120
Implantable Lead Implant Date: 20141120
Implantable Lead Location: 753858
Implantable Lead Location: 753859
Implantable Pulse Generator Implant Date: 20141120
Lead Channel Impedance Value: 400 Ohm
Lead Channel Impedance Value: 412.5 Ohm
Lead Channel Impedance Value: 425 Ohm
Lead Channel Pacing Threshold Amplitude: 0.5 V
Lead Channel Pacing Threshold Amplitude: 1 V
Lead Channel Pacing Threshold Pulse Width: 0.5 ms
Lead Channel Pacing Threshold Pulse Width: 0.5 ms
Lead Channel Pacing Threshold Pulse Width: 0.8 ms
Lead Channel Setting Pacing Amplitude: 2.5 V
Lead Channel Setting Pacing Pulse Width: 0.5 ms
Lead Channel Setting Pacing Pulse Width: 0.8 ms
Lead Channel Setting Sensing Sensitivity: 0.5 mV
MDC IDC LEAD IMPLANT DT: 20141120
MDC IDC LEAD LOCATION: 753860
MDC IDC MSMT BATTERY REMAINING LONGEVITY: 38 mo
MDC IDC MSMT LEADCHNL LV PACING THRESHOLD AMPLITUDE: 1 V
MDC IDC MSMT LEADCHNL LV PACING THRESHOLD PULSEWIDTH: 0.8 ms
MDC IDC MSMT LEADCHNL RV PACING THRESHOLD AMPLITUDE: 0.5 V
MDC IDC MSMT LEADCHNL RV SENSING INTR AMPL: 11.7 mV
MDC IDC SET LEADCHNL RV PACING AMPLITUDE: 2.5 V
Pulse Gen Serial Number: 7133629

## 2017-04-02 LAB — POCT INR: INR: 3

## 2017-04-02 NOTE — Patient Instructions (Addendum)
Take last dose on 04/06/17 for procedure on 04/12/17.  Resume normal dose after procedure.  Description   Continue taking 5 mg daily except 7.5 mg on Sundays and Thursdays.  Repeat INR in 1 week after procedure at [facility] office.

## 2017-04-02 NOTE — Patient Instructions (Signed)

## 2017-04-02 NOTE — Progress Notes (Signed)
HPI Maxwell Fitzgerald returns today for ongoing followup of his BiV ICD. He is a very pleasant 67 yo man with chronic systolic heart failure, atrial fib, LBBB, ICM, who underwent ICD insertion over a year ago. In the interim, he has lost his wife who died in a fire. He has moved and admits to gaining weight and not exercising. No chest pain or sob.  Allergies  Allergen Reactions  . Potassium Chloride Other (See Comments)    Kidney failure & heart EF issues - in large doses  . Potassium-Containing Compounds Other (See Comments)    Kidney failure: Specifically Potassium Chloride - in large doses  . Metformin And Related Other (See Comments)    Bothered liver      Current Outpatient Medications  Medication Sig Dispense Refill  . allopurinol (ZYLOPRIM) 100 MG tablet Take 100 mg by mouth 2 (two) times daily.     Marland Kitchen atorvastatin (LIPITOR) 20 MG tablet Take 1 tablet (20 mg total) by mouth daily at 6 PM. (Patient taking differently: Take 20 mg by mouth daily. ) 30 tablet 0  . calcitRIOL (ROCALTROL) 0.25 MCG capsule Take 0.25 mcg by mouth every Monday, Wednesday, and Friday.     . clonazePAM (KLONOPIN) 1 MG tablet Take 1 mg by mouth at bedtime.    . colchicine 0.6 MG tablet Take 0.6 mg by mouth daily as needed (for flare ups).     . diltiazem (CARDIZEM) 60 MG tablet Take 60 mg by mouth 2 (two) times daily.    . furosemide (LASIX) 40 MG tablet Take 40 mg by mouth 2 (two) times daily.    Marland Kitchen glimepiride (AMARYL) 2 MG tablet Take 2-4 mg by mouth See admin instructions. Take 2 mg by mouth in the morning and take 4 mg by mouth at bedtime    . Insulin Glargine (BASAGLAR KWIKPEN) 100 UNIT/ML SOPN Inject 16-18 Units into the skin See admin instructions. Inject 18 units SQ in the morning and inject 16 units SQ in the evening    . Liniments (SALONPAS EX) Apply 1 patch topically daily as needed (for pain).    Marland Kitchen losartan (COZAAR) 50 MG tablet Take 50 mg by mouth daily.     . metoprolol (LOPRESSOR) 50 MG tablet  Take 1 tablet (50 mg total) by mouth 2 (two) times daily. 180 tablet 3  . metoprolol tartrate (LOPRESSOR) 25 MG tablet TAKE 3 TABLETS (75 MG TOTAL) BY MOUTH 2 (TWO) TIMES DAILY. (Patient taking differently: Take 25 mg by mouth twice daily. Take with 50 mg to = 75 mg) 180 tablet 5  . naproxen sodium (ALEVE) 220 MG tablet Take 220 mg by mouth daily as needed (for pain or headache).    . sodium polystyrene (KAYEXALATE) 15 GM/60ML suspension Take 15 g by mouth every Wednesday.     . triamcinolone cream (KENALOG) 0.1 % Apply 1 application topically every other day.    . warfarin (COUMADIN) 5 MG tablet TAKE ONE TABLET BY MOUTH DAILY OR AS DIRECTED BY COUMADIN CLINIC (Patient taking differently: Take 7.5 mg by mouth daily on Sunday and Thursday. Take 5 mg by mouth daily on all other days) 90 tablet 0   No current facility-administered medications for this visit.      Past Medical History:  Diagnosis Date  . AICD (automatic cardioverter/defibrillator) present   . Atrial fibrillation, permanent (HCC)    on warfarin; recurred after DCCV  . Biventricular ICD (implantable cardioverter-defibrillator) in place  implanted by Dr. Ladona Ridgel November 2014  . Cardiomyopathy, EF 30-35% by echo 03/2012 05/19/2012   Decreased from 2009 Echo, 40-45%  . CHF (congestive heart failure) (HCC)   . Chronic anticoagulation    on coumadin  . CKD (chronic kidney disease) stage 3, GFR 30-59 ml/min (HCC)    Baseline BUN/Cr 09/2011 - 36/1.44  . COPD (chronic obstructive pulmonary disease) (HCC)   . Coronary artery disease    s/p CABG 1998; Myoview 2008 mild septal ischemia -no cath,  . Hypertension   . LBBB (left bundle branch block)    chronic  . PAD (peripheral artery disease) (HCC) 1997   Bilateral SFA PTA in 1997  . Peripheral neuropathy   . Renal disorder   . Type 2 diabetes mellitus (HCC)     ROS:   All systems reviewed and negative except as noted in the HPI.   Past Surgical History:  Procedure  Laterality Date  . BI-VENTRICULAR IMPLANTABLE CARDIOVERTER DEFIBRILLATOR N/A 01/08/2013   Procedure: BI-VENTRICULAR IMPLANTABLE CARDIOVERTER DEFIBRILLATOR  (CRT-D);  Surgeon: Marinus Maw, MD;  Location: [facility] CATH LAB;  Service: Cardiovascular;  Laterality: N/A;  . BI-VENTRICULAR IMPLANTABLE CARDIOVERTER DEFIBRILLATOR  (CRT-D)  01-08-2013   STJ CRTD implanted by Dr Ladona Ridgel for ischemic cardiomyopathy, CHF, and LBBB  . CORONARY ARTERY BYPASS GRAFT  03/02/1996   x7, LIMA-LAD, SVG-OM1, SVG-OM2, SVG-OM3, SVG-PDA, SVG-PLA  . KNEE ARTHROSCOPY Right 03/07/2015   Procedure: ARTHROSCOPY RIGHT KNEE WITH DEBRIDEMENT;  Surgeon: Durene Romans, MD;  Location: [facility] ORS;  Service: Orthopedics;  Laterality: Right;  . PERIPHERAL VASCULAR INTERVENTION  02/18/1996   Bilateral SFA PTA  . PERIPHERAL VASCULAR INTERVENTION  06/30/1996   R SFA-high grade 95% focal stenosis crossed w/ a 5x52mm Cordis "Opti-5" balloon dilated at 5atm-56sec, 7atm-90sec, and 5atm-120sec     Family History  Problem Relation Age of Onset  . CAD Mother   . Diabetes Mother   . CAD Father   . Diabetes Father   . CAD Brother      Social History   Socioeconomic History  . Marital status: Married    Spouse name: Not on file  . Number of children: Not on file  . Years of education: Not on file  . Highest education level: Not on file  Social Needs  . Financial resource strain: Not on file  . Food insecurity - worry: Not on file  . Food insecurity - inability: Not on file  . Transportation needs - medical: Not on file  . Transportation needs - non-medical: Not on file  Occupational History  . Not on file  Tobacco Use  . Smoking status: Former Smoker    Last attempt to quit: 05/20/1995    Years since quitting: 21.8  . Smokeless tobacco: Never Used  Substance and Sexual Activity  . Alcohol use: Yes    Comment: 3 drinks per week   . Drug use: No    Comment: quit in 1972  . Sexual activity: Not on file  Other Topics Concern  . Not  on file  Social History Narrative  . Not on file     BP 126/82   Pulse 80   Ht 5\' 10"  (1.778 m)   Wt 209 lb (94.8 kg)   BMI 29.99 kg/m   Physical Exam:  Well appearing 67 yo man, NAD HEENT: Unremarkable Neck:  6 cm JVD, no thyromegally Lymphatics:  No adenopathy Back:  No CVA tenderness Lungs:  Clear with no wheezes HEART:  Regular rate rhythm,  no murmurs, no rubs, no clicks Abd:  soft, positive bowel sounds, no organomegally, no rebound, no guarding Ext:  2 plus pulses, no edema, no cyanosis, no clubbing Skin:  No rashes no nodules Neuro:  CN II through XII intact, motor grossly intact  EKG - none  DEVICE  Normal device function.  See PaceArt for details. Atrial fib is underlying  Assess/Plan: 1. ICD - his [employer] Jude BiV ICD is working normally. Software upgrade performed today. 2. Atrial fib - he has been in persistent atrial fib for the past year. He is asymptomatic. I consider switching to rhythm control strategy but will hold off on this for now as he is feeling fairly well. 3. Chronic systolic heart failure - his symptoms are class 2. He will increase his physical activity. 4. Peripheral vascular disease - he is a patient of Dr. Hazle Coca. He denies claudication or ulcers.   Leonia Reeves.D.

## 2017-04-08 NOTE — Progress Notes (Signed)
CARDIAC CLEARANCE NOTE DR Allyson Sabal 03-19-17 Epic LAST DEVICE CHECK  2-29-19 Epic LOV DR TAYLOR 04-02-17 Epic EKG 10-30-16 Epic ECHO 12-05-15 EPIC

## 2017-04-08 NOTE — Patient Instructions (Addendum)
Maxwell Fitzgerald  04/08/2017   Your procedure is scheduled on: Friday 04-12-17  Report to [facility] Main  Entrance  Follow signs to Short Stay on first floor at 530 AM  Call this number if you have problems the morning of surgery [phone]   Remember: Do not eat food or drink liquids :After Midnight.     Take these medicines the morning of surgery with A SIP OF WATER: ALLOPURINOL, CALCITROL (DILTIAZEM), METOPROLOL (LOPRESSOR),                How to Manage Your Diabetes Before and After Surgery  Why is it important to control my blood sugar before and after surgery? . Improving blood sugar levels before and after surgery helps healing and can limit problems. . A way of improving blood sugar control is eating a healthy diet by: o  Eating less sugar and carbohydrates o  Increasing activity/exercise o  Talking with your doctor about reaching your blood sugar goals . High blood sugars (greater than 180 mg/dL) can raise your risk of infections and slow your recovery, so you will need to focus on controlling your diabetes during the weeks before surgery. . Make sure that the doctor who takes care of your diabetes knows about your planned surgery including the date and location.  How do I manage my blood sugar before surgery? . Check your blood sugar at least 4 times a day, starting 2 days before surgery, to make sure that the level is not too high or low. o Check your blood sugar the morning of your surgery when you wake up and every 2 hours until you get to the Short Stay unit. . If your blood sugar is less than 70 mg/dL, you will need to treat for low blood sugar: o Do not take insulin. o Treat a low blood sugar (less than 70 mg/dL) with  cup of clear juice (cranberry or apple), 4 glucose tablets, OR glucose gel. o Recheck blood sugar in 15 minutes after treatment (to make sure it is greater than 70 mg/dL). If your blood sugar is not greater than 70 mg/dL on  recheck, call [phone] for further instructions. . Report your blood sugar to the short stay nurse when you get to Short Stay.  . If you are admitted to the hospital after surgery: o Your blood sugar will be checked by the staff and you will probably be given insulin after surgery (instead of oral diabetes medicines) to make sure you have good blood sugar levels. o The goal for blood sugar control after surgery is 80-180 mg/dL.   WHAT DO I DO ABOUT MY DIABETES MEDICATION?  .  THE DAY BEFORE SURGERY ON 04-11-17  TAKE YOUR GLIMPERIDE MORNING DOSE ONLY, DO NOT TAKE YOUR EVENING DOSE OF GLIMPERIDE  . THE DAY BEFORE SURGERY ON 04-11-17 TAKE YOUR FULL  MORNING DOSE OF GLARINGINE INSULIN 18 UNITS,  TAKE 1/2 YOUR EVENING DOSE OF GLARINGINE INSULIN 8 AND 1/2 IUNITS .       Marland Kitchen THE MORNING OF SURGERY 04-12-17,TAKE 1/2 YOUR USUAL DOSE OF GLARINGINE INSULIN, TAKE 9 UNITS.   1.  Shower with CHG Soap the night before surgery and the  morning of Surgery.  2.  If you choose to wash your hair, wash your hair first as usual with your  normal  shampoo.  3.  After you shampoo, rinse your hair and  body thoroughly to remove the  shampoo.                           4.  Use CHG as you would any other liquid soap.  You can apply chg directly  to the skin and wash                       Gently with a scrungie or clean washcloth.  5.  Apply the CHG Soap to your body ONLY FROM THE NECK DOWN.   Do not use on face/ open                           Wound or open sores. Avoid contact with eyes, ears mouth and genitals (private parts).                       Wash face,  Genitals (private parts) with your normal soap.             6.  Wash thoroughly, paying special attention to the area where your surgery  will be performed.  7.  Thoroughly rinse your body with warm water from the neck down.  8.  DO NOT shower/wash with your normal soap after using and rinsing off  the CHG Soap.                9.  Pat yourself dry with a clean  towel.            10.  Wear clean pajamas.            11.  Place clean sheets on your bed the night of your first shower and do not  sleep with pets. Day of Surgery : Do not apply any lotions/deodorants the morning of surgery.  Please wear clean clothes to the hospital/surgery center.  FAILURE TO FOLLOW THESE INSTRUCTIONS MAY RESULT IN THE CANCELLATION OF YOUR SURGERY PATIENT SIGNATURE_________________________________  NURSE SIGNATURE__________________________________  ________________________________________________________________________                 [name] may not have any metal on your body including hair pins and              piercings  Do not wear jewelry, make-up, lotions, powders or perfumes, deodorant             Do not wear nail polish.  Do not shave  48 hours prior to surgery.              Men may shave face and neck.   Do not bring valuables to the hospital. [facility] IS NOT             RESPONSIBLE   FOR VALUABLES.  Contacts, dentures or bridgework may not be worn into surgery.  Leave suitcase in the car. After surgery it may be brought to your room.                  Please read over the following fact sheets you were given: _____________________________________________________________________

## 2017-04-09 ENCOUNTER — Encounter (HOSPITAL_COMMUNITY): Payer: Self-pay

## 2017-04-09 ENCOUNTER — Encounter (HOSPITAL_COMMUNITY)
Admission: RE | Admit: 2017-04-09 | Discharge: 2017-04-09 | Disposition: A | Discharge status: Home / Self Care | Payer: Medicare Other | Source: Ambulatory Visit | Attending: Urology | Admitting: Urology

## 2017-04-09 ENCOUNTER — Other Ambulatory Visit: Payer: Self-pay

## 2017-04-09 DIAGNOSIS — I447 Left bundle-branch block, unspecified: Secondary | ICD-10-CM | POA: Diagnosis not present

## 2017-04-09 DIAGNOSIS — I429 Cardiomyopathy, unspecified: Secondary | ICD-10-CM | POA: Diagnosis not present

## 2017-04-09 DIAGNOSIS — N183 Chronic kidney disease, stage 3 (moderate): Secondary | ICD-10-CM | POA: Diagnosis not present

## 2017-04-09 DIAGNOSIS — Z7901 Long term (current) use of anticoagulants: Secondary | ICD-10-CM | POA: Diagnosis not present

## 2017-04-09 DIAGNOSIS — Z951 Presence of aortocoronary bypass graft: Secondary | ICD-10-CM | POA: Diagnosis not present

## 2017-04-09 DIAGNOSIS — I5043 Acute on chronic combined systolic (congestive) and diastolic (congestive) heart failure: Secondary | ICD-10-CM | POA: Diagnosis not present

## 2017-04-09 DIAGNOSIS — E1122 Type 2 diabetes mellitus with diabetic chronic kidney disease: Secondary | ICD-10-CM | POA: Diagnosis not present

## 2017-04-09 DIAGNOSIS — N179 Acute kidney failure, unspecified: Secondary | ICD-10-CM | POA: Diagnosis not present

## 2017-04-09 DIAGNOSIS — M109 Gout, unspecified: Secondary | ICD-10-CM | POA: Diagnosis not present

## 2017-04-09 DIAGNOSIS — I2510 Atherosclerotic heart disease of native coronary artery without angina pectoris: Secondary | ICD-10-CM | POA: Diagnosis not present

## 2017-04-09 DIAGNOSIS — I130 Hypertensive heart and chronic kidney disease with heart failure and stage 1 through stage 4 chronic kidney disease, or unspecified chronic kidney disease: Secondary | ICD-10-CM | POA: Diagnosis not present

## 2017-04-09 DIAGNOSIS — N528 Other male erectile dysfunction: Secondary | ICD-10-CM | POA: Diagnosis not present

## 2017-04-09 DIAGNOSIS — I482 Chronic atrial fibrillation: Secondary | ICD-10-CM | POA: Diagnosis not present

## 2017-04-09 DIAGNOSIS — I771 Stricture of artery: Secondary | ICD-10-CM | POA: Diagnosis not present

## 2017-04-09 DIAGNOSIS — Z95810 Presence of automatic (implantable) cardiac defibrillator: Secondary | ICD-10-CM | POA: Diagnosis not present

## 2017-04-09 DIAGNOSIS — Z87891 Personal history of nicotine dependence: Secondary | ICD-10-CM | POA: Diagnosis not present

## 2017-04-09 DIAGNOSIS — E1151 Type 2 diabetes mellitus with diabetic peripheral angiopathy without gangrene: Secondary | ICD-10-CM | POA: Diagnosis not present

## 2017-04-09 DIAGNOSIS — E875 Hyperkalemia: Secondary | ICD-10-CM | POA: Diagnosis not present

## 2017-04-09 DIAGNOSIS — I252 Old myocardial infarction: Secondary | ICD-10-CM | POA: Diagnosis not present

## 2017-04-09 DIAGNOSIS — E785 Hyperlipidemia, unspecified: Secondary | ICD-10-CM | POA: Diagnosis not present

## 2017-04-09 DIAGNOSIS — Z79899 Other long term (current) drug therapy: Secondary | ICD-10-CM | POA: Diagnosis not present

## 2017-04-09 DIAGNOSIS — Z7984 Long term (current) use of oral hypoglycemic drugs: Secondary | ICD-10-CM | POA: Diagnosis not present

## 2017-04-09 HISTORY — DX: Anxiety disorder, unspecified: F41.9

## 2017-04-09 HISTORY — DX: Reserved for inherently not codable concepts without codable children: IMO0001

## 2017-04-09 LAB — CBC
HEMATOCRIT: 36 % — AB (ref 39.0–52.0)
HEMOGLOBIN: 11.4 g/dL — AB (ref 13.0–17.0)
MCH: 32.6 pg (ref 26.0–34.0)
MCHC: 31.7 g/dL (ref 30.0–36.0)
MCV: 102.9 fL — AB (ref 78.0–100.0)
Platelets: 141 10*3/uL — ABNORMAL LOW (ref 150–400)
RBC: 3.5 MIL/uL — ABNORMAL LOW (ref 4.22–5.81)
RDW: 13.8 % (ref 11.5–15.5)
WBC: 8.1 10*3/uL (ref 4.0–10.5)

## 2017-04-09 LAB — BASIC METABOLIC PANEL
Anion gap: 11 (ref 5–15)
BUN: 97 mg/dL — AB (ref 6–20)
CALCIUM: 9.2 mg/dL (ref 8.9–10.3)
CO2: 25 mmol/L (ref 22–32)
CREATININE: 3.49 mg/dL — AB (ref 0.61–1.24)
Chloride: 102 mmol/L (ref 101–111)
GFR calc non Af Amer: 17 mL/min — ABNORMAL LOW (ref >60)
GFR, EST AFRICAN AMERICAN: 19 mL/min — AB (ref >60)
GLUCOSE: 328 mg/dL — AB (ref 65–99)
Potassium: 5.9 mmol/L — ABNORMAL HIGH (ref 3.5–5.1)
Sodium: 138 mmol/L (ref 135–145)

## 2017-04-09 LAB — GLUCOSE, CAPILLARY: Glucose-Capillary: 323 mg/dL — ABNORMAL HIGH (ref 65–99)

## 2017-04-09 LAB — HEMOGLOBIN A1C
Hgb A1c MFr Bld: 9.3 % — ABNORMAL HIGH (ref 4.8–5.6)
MEAN PLASMA GLUCOSE: 220.21 mg/dL

## 2017-04-09 LAB — PROTIME-INR
INR: 1.47
Prothrombin Time: 17.7 seconds — ABNORMAL HIGH (ref 11.4–15.2)

## 2017-04-09 NOTE — Progress Notes (Signed)
BMET AND HEAMGLOBIN A1C RESULTS FAXED TO DR Vernie Ammons POD AND MEDICAL RECORDS BY EPIC

## 2017-04-10 NOTE — Discharge Instructions (Signed)
Penile prosthesis postoperative instructions ° °Wound: ° °In most cases your incision will have absorbable sutures that will dissolve within the first 10-20 days. Some will fall out even earlier. Expect some redness as the sutures dissolved but this should occur only around the sutures. If there is generalized redness, especially with increasing pain or swelling, let us know. The scrotum and penis will very likely get "black and blue" as the blood in the tissues spread. Sometimes the whole scrotum will turn colors. The black and blue is followed by a yellow and brown color. In time, all the discoloration will go away. In some cases some firm swelling in the area of the testicle and pump may persist for up to 4-6 weeks after the surgery and is considered normal in most cases. ° °Diet: ° °You may return to your normal diet within 24 hours following your surgery. You may note some mild nausea and possibly vomiting the first 6-8 hours following surgery. This is usually due to the side effects of anesthesia, and will disappear quite soon. I would suggest clear liquids and a very light meal the first evening following your surgery. ° °Activity: ° °Your physical activity should be restricted the first 48 hours. During that time you should remain relatively inactive, moving about only when necessary. During the first 7-10 days following surgery he should avoid lifting any heavy objects (anything greater than 15 pounds), and avoid strenuous exercise. If you work, ask us specifically about your restrictions, both for work and home. We will write a note to your employer if needed. ° °You should plan to wear a tight pair of jockey shorts or an athletic supporter for the first 4-5 days, even to sleep. This will keep the scrotum immobilized to some degree and keep the swelling down.The position of your penis will determine what is most comfortable but I strongly urge you to keep the penis in the "up" position (toward your  head). ° °Ice packs should be placed on and off over the scrotum for the first 48 hours. Frozen peas or corn in a ZipLock bag can be frozen, used and re-frozen. Fifteen minutes on and 15 minutes off is a reasonable schedule. The ice is a good pain reliever and keeps the swelling down. ° °Hygiene: ° °You may shower 48 hours after your surgery. Tub bathing should be restricted until the seventh day. ° °Medication: ° °You will be sent home with some type of pain medication. In many cases you will be sent home with a narcotic pain pill (hydrocodone or oxycodone). If the pain is not too bad, you may take either Tylenol (acetaminophen) or Advil (ibuprofen) which contain no narcotic agents, and might be tolerated a little better, with fewer side effects. If the pain medication you are sent home with does not control the pain, you will have to let us know. Some narcotic pain medications cannot be given or refilled by a phone call to a pharmacy. ° °Problems you should report to us: ° °· Fever of 101.0 degrees Fahrenheit or greater. °· Moderate or severe swelling under the skin incision or involving the scrotum. °Drug reaction such as hives, a rash, nausea or vomiting. °

## 2017-04-10 NOTE — H&P (Signed)
HPI: Maxwell Fitzgerald is a 67 year-old male established patient who is here for erectile dysfunction.  He does have problems with erectile dysfunction. His symptoms did begin gradually. He does have difficulties achieving an erection. He does have trouble maintaining his erection. His libido has not decreased.   He has tried Viagra and Levitra for his erectile dysfunction. His medication did not allow him to achieve satisfactory erections.   He has tried a vacuum erection device. He has as risk factors for erectile dysfunction: diabetes, hyperlipidemia, and hypertension.   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 He then got divorced and had not been using it much. His wife has now passed and he has a new partner and is interested in treatment. He has tried a vacuum erection device and this was not effective for him.   10/19/16: He returns today for intracavernosal test injection.   03/12/17: In 8/18 he began using intracavernosal injection therapy (40/30/1). This is not working well for him and therefore he said he came in today because he wanted to proceed with inflatable penile prosthesis implantation.     ALLERGIES: enalapril metformin Potassium TABS    MEDICATIONS: Allopurinol 100 mg tablet  Metoprolol Tartrate 50 mg tablet  Triple Mix PGE1 40 Micrograms, Papaverine 30 mg, phentolamine 1 mg and lidocaine 10 mg/mL Dispense 10 cc  Warfarin Sodium 5 mg tablet  Atorvastatin Calcium 20 mg tablet  Basaglar Kwikpen U-100  Calcitriol 0.25 mcg capsule  Clonazepam 1 mg tablet  Furosemide 40 mg tablet  Glimepiride 2 mg tablet  Losartan Potassium 50 mg tablet  Sildenafil Citrate 100 mg tablet     GU PSH: Penile Injection - 10/19/2016    NON-GU PSH: Arthroscopic Knee Surgery Coronary Artery  Bypass Grafting Pacemaker placement    GU PMH: ED due to arterial insufficiency (Stable), I have discussed with him the potential complications and risks including an erection lasting greater than 4 hours which would necessitate an emergent treatment either in our office if open or in the emergency room. We discussed getting the location of his injection to prevent scarring and penile curvature as well as the other potential risks and complications. - 10/19/2016, We discussed the various etiologies of ED including neurogenic (small nerve damage, especially in diabetics), vascular disease, psychogenic, etc. We discussed the treatment ladder approach ranging from oral PDE5i meds, to vacuum erection devices, to intraurethral medication, to injection therapy, to prosthetics and their and presented them in order of increasing efficacy, but also increasing risk. We also discussed the role of androgen evaluation and replacement and that I typically reserve such work-ups for men with low-libido as significant issue. We concluded by mentioning the association of ED and serious cardiovascular disease (coronary atherosclerosis // CAD) with ED pre-dating CAD sometimes by a period of years and the non-guideline recommendations for dedicate CV evaluation in patients with new ED. After consideration of therapeutic options, the patient has elected to try intracavernosal injections, - 10/11/2016 Kidney Failure Unspec    NON-GU PMH: Anxiety Arrhythmia Arthritis Atrial Fibrillation Diabetes Type 2 Gout Heart disease, unspecified Heart failure, unspecified    FAMILY HISTORY: 2 daughters - Daughter   SOCIAL HISTORY: Marital Status: Widowed Preferred Language: English Current Smoking Status: Patient does not smoke anymore.   Tobacco Use Assessment Completed: Used  Tobacco in last 30 days? Does drink.  Drinks 2 caffeinated drinks per day. Patient's occupation is/was Retired/[employer].    REVIEW OF SYSTEMS:    GU  Review Male:   Patient reports erection problems. Patient denies frequent urination, hard to postpone urination, burning/ pain with urination, get up at night to urinate, leakage of urine, stream starts and stops, trouble starting your stream, have to strain to urinate , and penile pain.  Gastrointestinal (Upper):   Patient denies nausea, vomiting, and indigestion/ heartburn.  Gastrointestinal (Lower):   Patient denies diarrhea and constipation.  Constitutional:   Patient denies fever, night sweats, weight loss, and fatigue.  Skin:   Patient denies itching and skin rash/ lesion.  Eyes:   Patient denies blurred vision and double vision.  Ears/ Nose/ Throat:   Patient denies sore throat and sinus problems.  Hematologic/Lymphatic:   Patient denies swollen glands and easy bruising.  Cardiovascular:   Patient denies leg swelling and chest pains.  Respiratory:   Patient denies cough and shortness of breath.  Endocrine:   Patient denies excessive thirst.  Musculoskeletal:   Patient denies back pain and joint pain.  Neurological:   Patient denies headaches and dizziness.  Psychologic:   Patient denies depression and anxiety.   VITAL SIGNS:     Weight 198 lb / 89.81 kg  Height 70 in / 177.8 cm  BP 126/63 mmHg  Pulse 64 /min  BMI 28.4 kg/m   GU PHYSICAL EXAMINATION:    Scrotum: No lesions. No edema. No cysts. No warts.  Epididymides: Right: no spermatocele, no masses, no cysts, no tenderness, no induration, no enlargement. Left: no spermatocele, no masses, no cysts, no tenderness, no induration, no enlargement.  Testes: No tenderness, no swelling, no enlargement left testes. No tenderness, no swelling, no enlargement right testes. Normal location left testes. Normal location right testes. No mass, no cyst, no varicocele, no hydrocele left testes. No mass, no cyst, no varicocele, no hydrocele right testes.  Urethral Meatus: Normal size. No lesion, no wart, no discharge, no polyp. Normal location.   Penis: Circumcised, no warts, no cracks. No dorsal Peyronie's plaques, no left corporal Peyronie's plaques, no right corporal Peyronie's plaques, no scarring, no warts. No balanitis, no meatal stenosis.   MULTI-SYSTEM PHYSICAL EXAMINATION:    Constitutional: Well-nourished. No physical deformities. Normally developed. Good grooming.  Neck: Neck symmetrical, not swollen. Normal tracheal position.  Respiratory: No labored breathing, no use of accessory muscles.   Cardiovascular: Normal temperature, normal extremity pulses, no swelling, no varicosities.  Lymphatic: No enlargement of neck, axillae, groin.  Skin: No paleness, no jaundice, no cyanosis. No lesion, no ulcer, no rash.  Neurologic / Psychiatric: Oriented to time, oriented to place, oriented to person. No depression, no anxiety, no agitation.  Gastrointestinal: No mass, no tenderness, no rigidity, non obese abdomen.  Eyes: Normal conjunctivae. Normal eyelids.  Ears, Nose, Mouth, and Throat: Left ear no scars, no lesions, no masses. Right ear no scars, no lesions, no masses. Nose no scars, no lesions, no masses. Normal hearing. Normal lips.  Musculoskeletal: Normal gait and station of head and neck.    PAST DATA REVIEWED:  Source Of History:  Patient  Records Review:   Previous Patient Records, POC Tool   08/03/15  PSA  Total PSA 1.82 ng/dl    PROCEDURES:          Urinalysis w/Scope Dipstick Dipstick Cont'd Micro  Color: Straw Bilirubin: Neg WBC/hpf: NS (Not Seen)  Appearance: Clear Ketones: Neg RBC/hpf:  0 - 2/hpf  Specific Gravity: 1.020 Blood: Neg Bacteria: NS (Not Seen)  pH: 5.5 Protein: 2+ Cystals: NS (Not Seen)  Glucose: Neg Urobilinogen: 0.2 Casts: NS (Not Seen)    Nitrites: Neg Trichomonas: Not Present    Leukocyte Esterase: Neg Mucous: Not Present      Epithelial Cells: 0 - 5/hpf      Yeast: NS (Not Seen)      Sperm: Not Present    ASSESSMENT/PLAN: ED due to arterial insufficiency. We discussed prosthesis  implantation today in detail. He has had no prior abdominal surgeries. He has read information about the prosthesis but I showed him a demonstration prosthesis and showed him how it worked. I have also given him written information and a DVD from [employer]. I then discussed the operation with him in detail. We discussed the incision used, the risks and complications, the probability of success as well as the need for an overnight stay in the hospital and the anticipated postoperative course. He is currently on Coumadin and has received clearance to stop that 5 days prior to his planned surgery.  I will proceed with three-piece inflatable penile prosthesis implantation.

## 2017-04-11 MED ORDER — DEXTROSE 5 % IV SOLN
120.0000 mg | Freq: Once | INTRAVENOUS | Status: AC
  Administered 2017-04-12: 120 mg via INTRAVENOUS
  Filled 2017-04-11: qty 3

## 2017-04-12 ENCOUNTER — Ambulatory Visit (HOSPITAL_COMMUNITY): Payer: Medicare Other | Admitting: Anesthesiology

## 2017-04-12 ENCOUNTER — Encounter (HOSPITAL_COMMUNITY)
Admission: RE | Disposition: A | Discharge status: Home / Self Care | Payer: Self-pay | Source: Ambulatory Visit | Attending: Urology

## 2017-04-12 ENCOUNTER — Encounter (HOSPITAL_COMMUNITY): Payer: Self-pay | Admitting: *Deleted

## 2017-04-12 ENCOUNTER — Observation Stay (HOSPITAL_COMMUNITY)
Admission: RE | Admit: 2017-04-12 | Discharge: 2017-04-13 | Disposition: A | Discharge status: Home / Self Care | Payer: Medicare Other | Source: Ambulatory Visit | Attending: Urology | Admitting: Urology

## 2017-04-12 ENCOUNTER — Other Ambulatory Visit: Payer: Self-pay

## 2017-04-12 DIAGNOSIS — I482 Chronic atrial fibrillation: Secondary | ICD-10-CM | POA: Diagnosis not present

## 2017-04-12 DIAGNOSIS — N5201 Erectile dysfunction due to arterial insufficiency: Secondary | ICD-10-CM | POA: Diagnosis not present

## 2017-04-12 DIAGNOSIS — I130 Hypertensive heart and chronic kidney disease with heart failure and stage 1 through stage 4 chronic kidney disease, or unspecified chronic kidney disease: Secondary | ICD-10-CM | POA: Insufficient documentation

## 2017-04-12 DIAGNOSIS — N529 Male erectile dysfunction, unspecified: Secondary | ICD-10-CM | POA: Diagnosis present

## 2017-04-12 DIAGNOSIS — Z79899 Other long term (current) drug therapy: Secondary | ICD-10-CM | POA: Insufficient documentation

## 2017-04-12 DIAGNOSIS — N179 Acute kidney failure, unspecified: Secondary | ICD-10-CM | POA: Diagnosis not present

## 2017-04-12 DIAGNOSIS — E875 Hyperkalemia: Secondary | ICD-10-CM | POA: Insufficient documentation

## 2017-04-12 DIAGNOSIS — E876 Hypokalemia: Secondary | ICD-10-CM | POA: Diagnosis not present

## 2017-04-12 DIAGNOSIS — I447 Left bundle-branch block, unspecified: Secondary | ICD-10-CM | POA: Insufficient documentation

## 2017-04-12 DIAGNOSIS — Z7901 Long term (current) use of anticoagulants: Secondary | ICD-10-CM | POA: Insufficient documentation

## 2017-04-12 DIAGNOSIS — I771 Stricture of artery: Secondary | ICD-10-CM | POA: Insufficient documentation

## 2017-04-12 DIAGNOSIS — Z7984 Long term (current) use of oral hypoglycemic drugs: Secondary | ICD-10-CM | POA: Insufficient documentation

## 2017-04-12 DIAGNOSIS — E1151 Type 2 diabetes mellitus with diabetic peripheral angiopathy without gangrene: Secondary | ICD-10-CM | POA: Insufficient documentation

## 2017-04-12 DIAGNOSIS — Z951 Presence of aortocoronary bypass graft: Secondary | ICD-10-CM | POA: Insufficient documentation

## 2017-04-12 DIAGNOSIS — Z87891 Personal history of nicotine dependence: Secondary | ICD-10-CM | POA: Insufficient documentation

## 2017-04-12 DIAGNOSIS — I2510 Atherosclerotic heart disease of native coronary artery without angina pectoris: Secondary | ICD-10-CM | POA: Insufficient documentation

## 2017-04-12 DIAGNOSIS — N183 Chronic kidney disease, stage 3 (moderate): Secondary | ICD-10-CM | POA: Insufficient documentation

## 2017-04-12 DIAGNOSIS — Z95810 Presence of automatic (implantable) cardiac defibrillator: Secondary | ICD-10-CM | POA: Diagnosis not present

## 2017-04-12 DIAGNOSIS — E785 Hyperlipidemia, unspecified: Secondary | ICD-10-CM | POA: Insufficient documentation

## 2017-04-12 DIAGNOSIS — E1122 Type 2 diabetes mellitus with diabetic chronic kidney disease: Secondary | ICD-10-CM | POA: Diagnosis not present

## 2017-04-12 DIAGNOSIS — M109 Gout, unspecified: Secondary | ICD-10-CM | POA: Insufficient documentation

## 2017-04-12 DIAGNOSIS — I429 Cardiomyopathy, unspecified: Secondary | ICD-10-CM | POA: Insufficient documentation

## 2017-04-12 DIAGNOSIS — I5043 Acute on chronic combined systolic (congestive) and diastolic (congestive) heart failure: Secondary | ICD-10-CM | POA: Diagnosis not present

## 2017-04-12 DIAGNOSIS — N528 Other male erectile dysfunction: Secondary | ICD-10-CM | POA: Diagnosis not present

## 2017-04-12 DIAGNOSIS — I252 Old myocardial infarction: Secondary | ICD-10-CM | POA: Insufficient documentation

## 2017-04-12 HISTORY — PX: PENILE PROSTHESIS IMPLANT: SHX240

## 2017-04-12 LAB — GLUCOSE, CAPILLARY
Glucose-Capillary: 127 mg/dL — ABNORMAL HIGH (ref 65–99)
Glucose-Capillary: 126 mg/dL — ABNORMAL HIGH (ref 65–99)
Glucose-Capillary: 154 mg/dL — ABNORMAL HIGH (ref 65–99)
Glucose-Capillary: 179 mg/dL — ABNORMAL HIGH (ref 65–99)
Glucose-Capillary: 240 mg/dL — ABNORMAL HIGH (ref 65–99)

## 2017-04-12 LAB — POCT I-STAT 4, (NA,K, GLUC, HGB,HCT)
Glucose, Bld: 133 mg/dL — ABNORMAL HIGH (ref 65–99)
HCT: 36 % — ABNORMAL LOW (ref 39.0–52.0)
Hemoglobin: 12.2 g/dL — ABNORMAL LOW (ref 13.0–17.0)
Potassium: 4.7 mmol/L (ref 3.5–5.1)
Sodium: 142 mmol/L (ref 135–145)

## 2017-04-12 LAB — BASIC METABOLIC PANEL
ANION GAP: 13 (ref 5–15)
BUN: 117 mg/dL — ABNORMAL HIGH (ref 6–20)
CHLORIDE: 101 mmol/L (ref 101–111)
CO2: 27 mmol/L (ref 22–32)
Calcium: 8.4 mg/dL — ABNORMAL LOW (ref 8.9–10.3)
Creatinine, Ser: 3.33 mg/dL — ABNORMAL HIGH (ref 0.61–1.24)
GFR calc non Af Amer: 18 mL/min — ABNORMAL LOW (ref >60)
GFR, EST AFRICAN AMERICAN: 20 mL/min — AB (ref >60)
Glucose, Bld: 144 mg/dL — ABNORMAL HIGH (ref 65–99)
POTASSIUM: 5.4 mmol/L — AB (ref 3.5–5.1)
Sodium: 141 mmol/L (ref 135–145)

## 2017-04-12 LAB — PROTIME-INR
INR: 1.46
Prothrombin Time: 17.6 seconds — ABNORMAL HIGH (ref 11.4–15.2)

## 2017-04-12 SURGERY — INSERTION, PENILE PROSTHESIS, INFLATABLE
Anesthesia: General

## 2017-04-12 MED ORDER — PHENYLEPHRINE HCL 10 MG/ML IJ SOLN
INTRAMUSCULAR | Status: AC
  Filled 2017-04-12: qty 1

## 2017-04-12 MED ORDER — FENTANYL CITRATE (PF) 100 MCG/2ML IJ SOLN
INTRAMUSCULAR | Status: DC | PRN
  Administered 2017-04-12: 50 ug via INTRAVENOUS
  Administered 2017-04-12 (×2): 25 ug via INTRAVENOUS

## 2017-04-12 MED ORDER — PROPOFOL 10 MG/ML IV BOLUS
INTRAVENOUS | Status: DC | PRN
  Administered 2017-04-12: 150 mg via INTRAVENOUS

## 2017-04-12 MED ORDER — ONDANSETRON HCL 4 MG/2ML IJ SOLN: 4.0000 mg | INTRAMUSCULAR | Status: DC | PRN

## 2017-04-12 MED ORDER — STERILE WATER FOR IRRIGATION IR SOLN
Status: DC | PRN
  Administered 2017-04-12: 500 mL

## 2017-04-12 MED ORDER — EPHEDRINE SULFATE-NACL 50-0.9 MG/10ML-% IV SOSY
PREFILLED_SYRINGE | INTRAVENOUS | Status: DC | PRN
  Administered 2017-04-12 (×2): 5 mg via INTRAVENOUS

## 2017-04-12 MED ORDER — SODIUM CHLORIDE 0.9 % IR SOLN
Status: DC | PRN
  Administered 2017-04-12: 1000 mL

## 2017-04-12 MED ORDER — PROMETHAZINE HCL 25 MG/ML IJ SOLN: 6.2500 mg | INTRAMUSCULAR | Status: DC | PRN

## 2017-04-12 MED ORDER — ONDANSETRON HCL 4 MG/2ML IJ SOLN
INTRAMUSCULAR | Status: DC | PRN
  Administered 2017-04-12: 4 mg via INTRAVENOUS

## 2017-04-12 MED ORDER — OXYCODONE HCL 5 MG PO TABS
5.0000 mg | ORAL_TABLET | ORAL | Status: DC | PRN
  Administered 2017-04-12 – 2017-04-13 (×2): 5 mg via ORAL
  Filled 2017-04-12 (×2): qty 1

## 2017-04-12 MED ORDER — LACTATED RINGERS IV SOLN: INTRAVENOUS | Status: DC | PRN

## 2017-04-12 MED ORDER — PROPOFOL 10 MG/ML IV BOLUS
INTRAVENOUS | Status: AC
  Filled 2017-04-12: qty 40

## 2017-04-12 MED ORDER — OXYCODONE HCL 5 MG PO TABS: 5.0000 mg | ORAL_TABLET | Freq: Once | ORAL | Status: DC | PRN

## 2017-04-12 MED ORDER — POLYMYXIN B SULFATE 500000 UNITS IJ SOLR
INTRAMUSCULAR | Status: AC
  Filled 2017-04-12: qty 500000

## 2017-04-12 MED ORDER — DEXAMETHASONE SODIUM PHOSPHATE 10 MG/ML IJ SOLN
INTRAMUSCULAR | Status: DC | PRN
  Administered 2017-04-12: 8 mg via INTRAVENOUS
  Administered 2017-04-12: 40 mg via INTRAVENOUS

## 2017-04-12 MED ORDER — BUPIVACAINE-EPINEPHRINE (PF) 0.5% -1:200000 IJ SOLN
INTRAMUSCULAR | Status: AC
  Filled 2017-04-12: qty 30

## 2017-04-12 MED ORDER — SODIUM CHLORIDE 0.9 % IV SOLN
INTRAVENOUS | Status: DC
  Administered 2017-04-12: 1000 mL via INTRAVENOUS
  Administered 2017-04-12: 06:00:00 via INTRAVENOUS

## 2017-04-12 MED ORDER — BACITRACIN ZINC 500 UNIT/GM EX OINT
TOPICAL_OINTMENT | CUTANEOUS | Status: AC
  Filled 2017-04-12: qty 28.35

## 2017-04-12 MED ORDER — FENTANYL CITRATE (PF) 100 MCG/2ML IJ SOLN
INTRAMUSCULAR | Status: AC
  Filled 2017-04-12: qty 2

## 2017-04-12 MED ORDER — LIDOCAINE 2% (20 MG/ML) 5 ML SYRINGE
INTRAMUSCULAR | Status: DC | PRN
  Administered 2017-04-12: 80 mg via INTRAVENOUS

## 2017-04-12 MED ORDER — SODIUM CHLORIDE 0.9 % IV SOLN
2.0000 g | Freq: Once | INTRAVENOUS | Status: AC
  Administered 2017-04-12: 2 g via INTRAVENOUS
  Filled 2017-04-12: qty 20

## 2017-04-12 MED ORDER — LIDOCAINE 2% (20 MG/ML) 5 ML SYRINGE
INTRAMUSCULAR | Status: AC
  Filled 2017-04-12: qty 15

## 2017-04-12 MED ORDER — CEFAZOLIN SODIUM-DEXTROSE 1-4 GM/50ML-% IV SOLN
1.0000 g | Freq: Three times a day (TID) | INTRAVENOUS | Status: DC
  Administered 2017-04-12 – 2017-04-13 (×3): 1 g via INTRAVENOUS
  Filled 2017-04-12 (×6): qty 50

## 2017-04-12 MED ORDER — MIDAZOLAM HCL 2 MG/2ML IJ SOLN
INTRAMUSCULAR | Status: AC
  Filled 2017-04-12: qty 2

## 2017-04-12 MED ORDER — PHENYLEPHRINE 40 MCG/ML (10ML) SYRINGE FOR IV PUSH (FOR BLOOD PRESSURE SUPPORT)
PREFILLED_SYRINGE | INTRAVENOUS | Status: DC | PRN
  Administered 2017-04-12 (×3): 80 ug via INTRAVENOUS
  Administered 2017-04-12: 120 ug via INTRAVENOUS

## 2017-04-12 MED ORDER — SODIUM CHLORIDE 0.9 % IV SOLN
INTRAVENOUS | Status: DC | PRN
  Administered 2017-04-12: 1000 mL

## 2017-04-12 MED ORDER — SODIUM CHLORIDE 0.9 % IV SOLN
INTRAVENOUS | Status: AC
  Filled 2017-04-12: qty 500000

## 2017-04-12 MED ORDER — SODIUM CHLORIDE 0.9 % IV SOLN
INTRAVENOUS | Status: DC
  Administered 2017-04-12: 13:00:00 via INTRAVENOUS

## 2017-04-12 MED ORDER — INSULIN ASPART 100 UNIT/ML ~~LOC~~ SOLN
0.0000 [IU] | SUBCUTANEOUS | Status: DC
  Administered 2017-04-12 (×2): 4 [IU] via SUBCUTANEOUS
  Administered 2017-04-12 – 2017-04-13 (×2): 7 [IU] via SUBCUTANEOUS
  Administered 2017-04-13 (×2): 4 [IU] via SUBCUTANEOUS

## 2017-04-12 MED ORDER — OXYCODONE HCL 5 MG/5ML PO SOLN
5.0000 mg | Freq: Once | ORAL | Status: DC | PRN
  Filled 2017-04-12: qty 5

## 2017-04-12 MED ORDER — BUPIVACAINE-EPINEPHRINE (PF) 0.5% -1:200000 IJ SOLN
INTRAMUSCULAR | Status: DC | PRN
  Administered 2017-04-12: 10 mL via PERINEURAL

## 2017-04-12 MED ORDER — PHENYLEPHRINE HCL 10 MG/ML IJ SOLN
INTRAVENOUS | Status: DC | PRN
  Administered 2017-04-12: 50 ug/min via INTRAVENOUS

## 2017-04-12 MED ORDER — HYDROMORPHONE HCL 1 MG/ML IJ SOLN
0.2500 mg | INTRAMUSCULAR | Status: DC | PRN
  Administered 2017-04-12 (×2): 0.5 mg via INTRAVENOUS

## 2017-04-12 MED ORDER — OXYCODONE HCL 10 MG PO TABS: 10.0000 mg | ORAL_TABLET | ORAL | 0 refills | Status: AC | PRN

## 2017-04-12 MED ORDER — MIDAZOLAM HCL 5 MG/5ML IJ SOLN
INTRAMUSCULAR | Status: DC | PRN
  Administered 2017-04-12: 2 mg via INTRAVENOUS

## 2017-04-12 MED ORDER — HYDROMORPHONE HCL 1 MG/ML IJ SOLN: 0.5000 mg | INTRAMUSCULAR | Status: DC | PRN

## 2017-04-12 MED ORDER — ACETAMINOPHEN 325 MG PO TABS
650.0000 mg | ORAL_TABLET | ORAL | Status: DC | PRN
Start: 2017-04-12 — End: 2017-04-13

## 2017-04-12 MED ORDER — BACITRACIN 500 UNIT/GM EX OINT
TOPICAL_OINTMENT | CUTANEOUS | Status: DC | PRN
  Administered 2017-04-12: 1 via TOPICAL

## 2017-04-12 MED ORDER — HYDROMORPHONE HCL 1 MG/ML IJ SOLN
INTRAMUSCULAR | Status: AC
  Filled 2017-04-12: qty 1

## 2017-04-12 MED ORDER — AMOXICILLIN-POT CLAVULANATE 875-125 MG PO TABS: 1.0000 | ORAL_TABLET | Freq: Two times a day (BID) | ORAL | 0 refills | Status: AC

## 2017-04-12 SURGICAL SUPPLY — 49 items
APL SKNCLS STERI-STRIP NONHPOA (GAUZE/BANDAGES/DRESSINGS) ×1
BAG URINE DRAINAGE (UROLOGICAL SUPPLIES) ×2 IMPLANT
BANDAGE COBAN STERILE 2 (GAUZE/BANDAGES/DRESSINGS) IMPLANT
BENZOIN TINCTURE PRP APPL 2/3 (GAUZE/BANDAGES/DRESSINGS) ×2 IMPLANT
BLADE HEX COATED 2.75 (ELECTRODE) ×2 IMPLANT
BLADE SURG 15 STRL LF DISP TIS (BLADE) ×1 IMPLANT
BLADE SURG 15 STRL SS (BLADE) ×2
BNDG CONFORM 4 STRL LF (GAUZE/BANDAGES/DRESSINGS) ×1 IMPLANT
BNDG GAUZE ELAST 4 BULKY (GAUZE/BANDAGES/DRESSINGS) ×2 IMPLANT
CATH FOLEY 2WAY SLVR  5CC 16FR (CATHETERS) ×1
CATH FOLEY 2WAY SLVR 5CC 16FR (CATHETERS) ×1 IMPLANT
CHLORAPREP W/TINT 26ML (MISCELLANEOUS) ×2 IMPLANT
COVER MAYO STAND STRL (DRAPES) ×2 IMPLANT
COVER SURGICAL LIGHT HANDLE (MISCELLANEOUS) ×2 IMPLANT
DISSECTOR ROUND CHERRY 3/8 STR (MISCELLANEOUS) ×2 IMPLANT
DRAPE EXTREMITY T 121X128X90 (DRAPE) ×2 IMPLANT
DRAPE INCISE IOBAN 66X45 STRL (DRAPES) IMPLANT
DRSG TEGADERM 4X4.75 (GAUZE/BANDAGES/DRESSINGS) ×2 IMPLANT
ELECT REM PT RETURN 15FT ADLT (MISCELLANEOUS) ×2 IMPLANT
GAUZE SPONGE 4X4 12PLY STRL (GAUZE/BANDAGES/DRESSINGS) IMPLANT
GAUZE SPONGE 4X4 16PLY XRAY LF (GAUZE/BANDAGES/DRESSINGS) ×1 IMPLANT
GLOVE BIOGEL M 8.0 STRL (GLOVE) ×4 IMPLANT
GOWN STRL REUS W/TWL XL LVL3 (GOWN DISPOSABLE) ×2 IMPLANT
KIT ASSEMBLY MENTOR (KITS) ×1 IMPLANT
KIT BASIN OR (CUSTOM PROCEDURE TRAY) ×2 IMPLANT
LUBRICANT JELLY K Y 4OZ (MISCELLANEOUS) ×2 IMPLANT
NEEDLE HYPO 22GX1.5 SAFETY (NEEDLE) ×2 IMPLANT
NS IRRIG 1000ML POUR BTL (IV SOLUTION) ×2 IMPLANT
PACK GENERAL/GYN (CUSTOM PROCEDURE TRAY) ×2 IMPLANT
PAD TELFA 2X3 NADH STRL (GAUZE/BANDAGES/DRESSINGS) ×2 IMPLANT
PLUG CATH AND CAP STER (CATHETERS) ×2 IMPLANT
PROS TITAN SCROT 0 ANG 16CM (ERECTILE RESTORATION) ×2
PROSTHESIS TTN SCRO 0 ANG 16CM (ERECTILE RESTORATION) IMPLANT
RESERVOIR 75CC LOCKOUT BIOFLEX (Erectile Restoration) ×1 IMPLANT
RETRACTOR WILSON SYSTEM (INSTRUMENTS) ×2 IMPLANT
SOAP 2 % CHG 4 OZ (WOUND CARE) ×2 IMPLANT
SPONGE LAP 4X18 X RAY DECT (DISPOSABLE) IMPLANT
STRIP CLOSURE SKIN 1/2X4 (GAUZE/BANDAGES/DRESSINGS) ×2 IMPLANT
SUPPORT SCROTAL LG STRP (MISCELLANEOUS) ×2 IMPLANT
SUT CHROMIC 3 0 SH 27 (SUTURE) ×5 IMPLANT
SUT MNCRL AB 4-0 PS2 18 (SUTURE) ×2 IMPLANT
SUT PDS AB 2-0 CT2 27 (SUTURE) ×8 IMPLANT
SUT VIC AB 2-0 UR6 27 (SUTURE) ×4 IMPLANT
SYR 20CC LL (SYRINGE) ×2 IMPLANT
SYR 50ML LL SCALE MARK (SYRINGE) ×4 IMPLANT
SYR CONTROL 10ML LL (SYRINGE) ×2 IMPLANT
SYRINGE 10CC LL (SYRINGE) ×2 IMPLANT
TOWEL OR 17X26 10 PK STRL BLUE (TOWEL DISPOSABLE) ×4 IMPLANT
WATER STERILE IRR 500ML POUR (IV SOLUTION) ×2 IMPLANT

## 2017-04-12 NOTE — Op Note (Signed)
PATIENT:  Radene Gunning  PRE-OPERATIVE DIAGNOSIS:  Organic erectile dysfunction  POST-OPERATIVE DIAGNOSIS:  Same  PROCEDURE:  Procedure(s): 3 piece inflatable in all prosthesis ([employer])  SURGEON:  Garnett Farm  INDICATION: Mr. 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  He has tried oral agents and has even tried intracavernosal injections without success.  He therefore has elected to proceed with penile prosthesis implantation.  ANESTHESIA:  General  EBL:  Minimal  Device: 3 piece Titan: 75 cc reservoir, 16 cm cylinders and 3 cm rear-tip extenders on right and left sides  LOCAL MEDICATIONS USED: 10 cc of half percent Marcaine with epinephrine  SPECIMEN: None  DISPOSITION OF SPECIMEN:  N/A  Description of procedure: The patient was taken to the major operating room, placed on the table and administered general anesthesia in the supine position. His genitalia was then scrubbed for 10 minutes, painted and then sterilely draped. An official timeout was then performed.  A 16 French Foley catheter was placed in the bladder and the bladder was drained and the catheter was plugged. A transverse upper scrotal incision was then made and then blunt dissection was used to expose the corpus cavernosum on the right and left sides. This was further cleared of overlying tissue using sharp technique. 2-0 PDS sutures were then placed in the corpus cavernosum to service stay sutures and then an incision was then made in the corpus cavernosum first on the left-hand side with the knife and then extended proximally and distally with the curved Mayo scissors. I then dilated the corpus cavernosum with the Hegar dilators starting at 10 and progressing to 13 proximally and distally. I then irrigated the corpus cavernosum with antibiotic solution and measured the distance proximally and distally from the stay suture and was found to be 12 cm proximally and 7 cm distally. I then turned my  attention to the contralateral corpus cavernosum and placed my stay sutures, made my corporotomy and dilated the corpus cavernosum in an identical fashion. This was measured and also was found to be 12 cm proximally and 7 cm distally. It was irrigated with antibiotic solution as was the scrotum. I then chose an 16 cm cylinder set with 3 cm rear-tip extenders and these were prepped while I prepared the site for reservoir placement.  I used blunt technique to dissect up to the external inguinal ring on the right-hand side.. I swept the spermatic cord laterally and then was able to bluntly dissect with my index finger through the floor of the inguinal ring. I drained the bladder completely with a Foley catheter and then developed a space behind the symphysis pubis for the reservoir. I irrigated the space with antibiotic solution and then placed the reservoir in this location. I then filled the reservoir with 60 cc of sterile saline.  Attention was redirected to the corporotomies where the cylinders were then placed by first fixing the suture to the distal aspect of the right cylinder to a straight needle. This was then loaded on the [facility] inserter and passed through the corporotomy and distally. I then advanced the straight needle with the Furlow inserter out through the glans and this was grasped with a hemostat and pulled through the glans and the suture was secured with a hemostat. I then performed an identical maneuver on the contralateral side. After this was performed I irrigated both corpus cavernosum and inserted the distal portion of the cylinder through the corporotomies and pulled this to the end of  the corpora with the suture. The proximal aspect with the rear-tip extender was then passed through the corporotomy and into the seated position on each side. I then connected reservoir tubing to a syringe filled with sterile saline and inflated the device. I noted a good straight erection with both  cylinders equidistant under the glans and no buckling of the cylinders. I therefore deflated the device and closed the corporotomies with running 2-0 PDS suture. The cylinder was then connected to the pump after excising the excess tubing with appropriate shodded hemostats in place and then I used the supplied connectors to make the connection. I then again cycled the device with the pump and it cycled properly. I deflated the device and pumped it up about three quarters of the way to aid with hemostasis. I irrigated the scrotum once again with antibiotic solution.  I then developed the scrotal pocket in the right hemiscrotum in place the pump in this location. I irrigated the wound one last time with antibiotic irrigation and then closed the deep scrotal tissue over the tubing and pump with running 3-0 chromic suture. A second layer was then closed over this first layer with running 3-0 chromic and then the skin was closed with running 3-0 chromic. Antibiotic ointment and sterile 4 x 4's were then applied to the incision. I then wrapped the scrotum and penis with a Curlex. The catheter was connected to closed system drainage and the patient was awakened and taken recovery room in stable and satisfactory condition. He tolerated the procedure well and there were no intraoperative complications. Needle sponge and instrument counts were correct at the end of the operation.  PLAN OF CARE: He will be observed overnight with intravenous antibiotics and pain medication with plan for discharge in the a.m.   PATIENT DISPOSITION:  PACU - hemodynamically stable.

## 2017-04-12 NOTE — Anesthesia Procedure Notes (Signed)
Procedure Name: LMA Insertion Date/Time: 04/12/2017 7:37 AM Performed by: Theodosia Quay, CRNA Pre-anesthesia Checklist: Patient identified, Emergency Drugs available, Suction available and Patient being monitored Patient Re-evaluated:Patient Re-evaluated prior to induction Oxygen Delivery Method: Circle System Utilized Preoxygenation: Pre-oxygenation with 100% oxygen Induction Type: IV induction Ventilation: Mask ventilation without difficulty LMA: LMA with gastric port inserted LMA Size: 4.0 Number of attempts: 1 Airway Equipment and Method: Bite block Placement Confirmation: positive ETCO2 Tube secured with: Tape Dental Injury: Teeth and Oropharynx as per pre-operative assessment

## 2017-04-12 NOTE — Anesthesia Preprocedure Evaluation (Signed)
Anesthesia Evaluation  Patient identified by MRN, date of birth, ID band Patient awake    Reviewed: Allergy & Precautions, H&P , NPO status , Patient's Chart, lab work & pertinent test results, reviewed documented beta blocker date and time   Airway Mallampati: III  TM Distance: >3 FB Neck ROM: Full    Dental no notable dental hx. (+) Partial Upper, Dental Advisory Given   Pulmonary neg pulmonary ROS, COPD, former smoker,    Pulmonary exam normal breath sounds clear to auscultation       Cardiovascular hypertension, Pt. on medications and Pt. on home beta blockers + CAD, + Past MI, + Peripheral Vascular Disease and +CHF  negative cardio ROS  + dysrhythmias Atrial Fibrillation + Cardiac Defibrillator  Rhythm:Regular Rate:Normal     Neuro/Psych negative neurological ROS  negative psych ROS   GI/Hepatic negative GI ROS, Neg liver ROS,   Endo/Other  negative endocrine ROSdiabetes, Type 2, Insulin Dependent  Renal/GU Renal diseasenegative Renal ROS  negative genitourinary   Musculoskeletal negative musculoskeletal ROS (+)   Abdominal   Peds negative pediatric ROS (+)  Hematology negative hematology ROS (+)   Anesthesia Other Findings   Reproductive/Obstetrics negative OB ROS                             Anesthesia Physical  Anesthesia Plan  ASA: III  Anesthesia Plan: General   Post-op Pain Management:    Induction: Intravenous  PONV Risk Score and Plan: 2  Airway Management Planned: LMA  Additional Equipment:   Intra-op Plan:   Post-operative Plan: Extubation in OR  Informed Consent: I have reviewed the patients History and Physical, chart, labs and discussed the procedure including the risks, benefits and alternatives for the proposed anesthesia with the patient or authorized representative who has indicated his/her understanding and acceptance.   Dental advisory  given  Plan Discussed with: CRNA  Anesthesia Plan Comments:         Anesthesia Quick Evaluation

## 2017-04-12 NOTE — Transfer of Care (Signed)
Immediate Anesthesia Transfer of Care Note  Patient: Maxwell Fitzgerald  Procedure(s) Performed: PENILE PROTHESIS INFLATABLE (N/A )  Patient Location: PACU  Anesthesia Type:General  Level of Consciousness: awake, alert  and oriented  Airway & Oxygen Therapy: Patient Spontanous Breathing and Patient connected to face mask oxygen  Post-op Assessment: Report given to RN  Post vital signs: Reviewed and stable  Last Vitals:  Vitals:   04/12/17 0542  BP: (!) 167/103  Pulse: 83  Temp: (!) 36.3 C  SpO2: 96%    Last Pain:  Vitals:   04/12/17 0542  TempSrc: Oral         Complications: No apparent anesthesia complications

## 2017-04-12 NOTE — Anesthesia Postprocedure Evaluation (Signed)
Anesthesia Post Note  Patient: Maxwell Fitzgerald  Procedure(s) Performed: PENILE PROTHESIS INFLATABLE (N/A )     Patient location during evaluation: PACU Anesthesia Type: General Level of consciousness: awake and alert Pain management: pain level controlled Vital Signs Assessment: post-procedure vital signs reviewed and stable Respiratory status: spontaneous breathing, nonlabored ventilation and respiratory function stable Cardiovascular status: blood pressure returned to baseline and stable Postop Assessment: no apparent nausea or vomiting Anesthetic complications: no    Last Vitals:  Vitals:   04/12/17 1115 04/12/17 1154  BP: 115/69 109/82  Pulse: (!) 37 74  Resp: 10 12  Temp:  (!) 36.3 C  SpO2: 92% 92%    Last Pain:  Vitals:   04/12/17 1154  TempSrc:   PainSc: 0-No pain                 Lowella Curb

## 2017-04-13 DIAGNOSIS — E1122 Type 2 diabetes mellitus with diabetic chronic kidney disease: Secondary | ICD-10-CM | POA: Diagnosis not present

## 2017-04-13 DIAGNOSIS — N179 Acute kidney failure, unspecified: Secondary | ICD-10-CM | POA: Diagnosis not present

## 2017-04-13 DIAGNOSIS — N528 Other male erectile dysfunction: Secondary | ICD-10-CM | POA: Diagnosis not present

## 2017-04-13 DIAGNOSIS — I130 Hypertensive heart and chronic kidney disease with heart failure and stage 1 through stage 4 chronic kidney disease, or unspecified chronic kidney disease: Secondary | ICD-10-CM | POA: Diagnosis not present

## 2017-04-13 DIAGNOSIS — I5043 Acute on chronic combined systolic (congestive) and diastolic (congestive) heart failure: Secondary | ICD-10-CM | POA: Diagnosis not present

## 2017-04-13 DIAGNOSIS — N183 Chronic kidney disease, stage 3 (moderate): Secondary | ICD-10-CM | POA: Diagnosis not present

## 2017-04-13 LAB — GLUCOSE, CAPILLARY
Glucose-Capillary: 156 mg/dL — ABNORMAL HIGH (ref 65–99)
Glucose-Capillary: 190 mg/dL — ABNORMAL HIGH (ref 65–99)
Glucose-Capillary: 191 mg/dL — ABNORMAL HIGH (ref 65–99)
Glucose-Capillary: 207 mg/dL — ABNORMAL HIGH (ref 65–99)

## 2017-04-13 MED ORDER — METOPROLOL TARTRATE 25 MG PO TABS
75.0000 mg | ORAL_TABLET | Freq: Two times a day (BID) | ORAL | Status: DC
  Administered 2017-04-13 (×2): 75 mg via ORAL
  Filled 2017-04-13 (×2): qty 3

## 2017-04-13 MED ORDER — DILTIAZEM HCL 60 MG PO TABS
60.0000 mg | ORAL_TABLET | Freq: Two times a day (BID) | ORAL | Status: DC
  Administered 2017-04-13 (×2): 60 mg via ORAL
  Filled 2017-04-13 (×2): qty 1

## 2017-04-13 MED ORDER — FUROSEMIDE 40 MG PO TABS
40.0000 mg | ORAL_TABLET | Freq: Two times a day (BID) | ORAL | Status: DC
  Administered 2017-04-13 (×2): 40 mg via ORAL
  Filled 2017-04-13 (×2): qty 1

## 2017-04-13 NOTE — Discharge Summary (Signed)
Date of admission: 04/12/2017  Date of discharge: 04/13/2017  Admission diagnosis: Erectile dysfunction  Discharge diagnosis: Erectile dysfunction   Secondary diagnoses:  Patient Active Problem List   Diagnosis Date Noted  . 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 Biventricular ICD (implantable cardioverter-defibrillator) in place 01/07/2015  . Chronic systolic heart failure ([place]) 12/18/2012  . Renal failure, acute on chronic (HCC) 05/21/2012  . LBBB (left bundle branch block) 05/20/2012  . PAD (peripheral artery disease) ([place]) 05/20/2012  . Chronic anticoagulation 05/20/2012  . Dyslipidemia 05/20/2012  . ARF (acute renal failure) ([place]) 05/19/2012  . Hyperkalemia 05/19/2012  . Cardiomyopathy, EF 30-35% by echo 03/2012 05/19/2012  . Cardiorenal syndrome with renal failure - acute on chronic renal failure with worsening HF. 05/19/2012  . Atrial fibrillation with rapid ventricular response ([place]) 05/19/2012  . Acute on chronic right-sided HF (heart failure) 05/19/2012  . Acute on chronic combined systolic and diastolic congestive heart failure, NYHA class 3 ([place])   . Type 2 diabetes mellitus ([place])   . Coronary artery disease, hx of CABG 1998, LIMA-LAD;VG-OM1-OM2-OM3; VG-PDA/PLA   . Atrial fibrillation, permanent   . Hypertension   . CKD (chronic kidney disease) stage 3, GFR 30-59 ml/min (HCC)     History and Physical: For full details, please see admission history and physical. Briefly, Maxwell Fitzgerald is a 67 y.o. year old patient with history of ED failed medical therapy who underwent on eventful IPP placement.   Hospital Course: Patient tolerated the procedure well.  He was then transferred to the floor after an uneventful PACU stay.  His hospital course was uncomplicated.  On POD#1 he had met discharge criteria: was eating a regular diet, was up and ambulating independently,  pain was well controlled,and was ready to for discharge.  He will undergo a trial of void today and  be discharged.  Physical exam on the day of discharge showed ecchymosis of the penis and scrotum as expected.  No sign of infection.  Incision was clean dry and intact.  Abdomen soft nontender nondistended.  He was in no acute distress.  Foley was clear yellow prior to removal.   Laboratory values:  Recent Labs    04/12/17 0658  HGB 12.2*  HCT 36.0*   Recent Labs    04/12/17 0658 04/12/17 1013  NA 142 141  K 4.7 5.4*  CL  --  101  CO2  --  27  GLUCOSE 133* 144*  BUN  --  117*  CREATININE  --  3.33*  CALCIUM  --  8.4*   Recent Labs    04/12/17 1013  INR 1.46   No results for input(s): LABURIN in the last 72 hours. Results for orders placed or performed during the hospital encounter of 01/08/13  Surgical pcr screen     Status: None   Collection Time: 01/08/13  6:42 AM  Result Value Ref Range Status   MRSA, PCR NEGATIVE NEGATIVE Final   Staphylococcus aureus NEGATIVE NEGATIVE Final    Comment:        The Xpert SA Assay (FDA approved for NASAL specimens in patients over 30 years of age), is one component of a comprehensive surveillance program.  Test performance has been validated by [employer] for patients greater than or equal to 53 year old. It is not intended to diagnose infection nor to guide or monitor treatment.    Disposition: Home  Discharge instruction: The patient was instructed to be ambulatory but told to refrain from heavy  lifting, strenuous activity, or driving.   Discharge medications:  Allergies as of 04/13/2017      Reactions   Potassium Chloride Other (See Comments)   Kidney failure & heart EF issues - in large doses   Potassium-containing Compounds Other (See Comments)   Kidney failure: Specifically Potassium Chloride - in large doses   Metformin And Related Other (See Comments)   Bothered liver       Medication List    TAKE these medications   allopurinol 100 MG tablet Commonly known as:  ZYLOPRIM Take 100 mg by mouth 2 (two)  times daily.   amoxicillin-clavulanate 875-125 MG tablet Commonly known as:  AUGMENTIN Take 1 tablet by mouth 2 (two) times daily for 10 days.   atorvastatin 20 MG tablet Commonly known as:  LIPITOR Take 1 tablet (20 mg total) by mouth daily at 6 PM. What changed:  when to take this   BASAGLAR KWIKPEN 100 UNIT/ML Sopn Inject 16-18 Units into the skin See admin instructions. Inject 18 units SQ in the morning and inject 16 units SQ in the evening   calcitRIOL 0.25 MCG capsule Commonly known as:  ROCALTROL Take 0.25 mcg by mouth every Monday, Wednesday, and Friday.   clonazePAM 1 MG tablet Commonly known as:  KLONOPIN Take 1 mg by mouth at bedtime.   colchicine 0.6 MG tablet Take 0.6 mg by mouth daily as needed (for flare ups).   diltiazem 60 MG tablet Commonly known as:  CARDIZEM Take 60 mg by mouth 2 (two) times daily.   furosemide 40 MG tablet Commonly known as:  LASIX Take 40 mg by mouth 2 (two) times daily.   glimepiride 2 MG tablet Commonly known as:  AMARYL Take 2-4 mg by mouth See admin instructions. Take 2 mg by mouth in the morning and take 4 mg by mouth at bedtime   losartan 50 MG tablet Commonly known as:  COZAAR Take 50 mg by mouth every evening.   metoprolol tartrate 50 MG tablet Commonly known as:  LOPRESSOR Take 1 tablet (50 mg total) by mouth 2 (two) times daily. What changed:  Another medication with the same name was changed. Make sure you understand how and when to take each.   metoprolol tartrate 25 MG tablet Commonly known as:  LOPRESSOR TAKE 3 TABLETS (75 MG TOTAL) BY MOUTH 2 (TWO) TIMES DAILY. What changed:  See the new instructions.   naproxen sodium 220 MG tablet Commonly known as:  ALEVE Take 220 mg by mouth daily as needed (for pain or headache).   Oxycodone HCl 10 MG Tabs Take 1 tablet (10 mg total) by mouth every 4 (four) hours as needed.   SALONPAS EX Apply 1 patch topically daily as needed (for pain).   sodium polystyrene 15  GM/60ML suspension Commonly known as:  KAYEXALATE Take 15 g by mouth every Wednesday.   triamcinolone cream 0.1 % Commonly known as:  KENALOG Apply 1 application topically every other day.   VICKS DAYQUIL SINUS PO Take by mouth. Liquid, prn   warfarin 5 MG tablet Commonly known as:  COUMADIN Take as directed. If you are unsure how to take this medication, talk to your nurse or doctor. Original instructions:  TAKE ONE TABLET BY MOUTH DAILY OR AS DIRECTED BY [facility]IN CLINIC What changed:  See the new instructions.       Followup:  Follow-up Information    [facility] Follow up on 04/19/2017.   Why:  For your appiontment at 8:00  Contact information: [place] [phone]

## 2017-04-13 NOTE — Progress Notes (Incomplete)
Patient has been voiding small amounts of urine throughout the day.  Checked PVR twice and bladder scanner is showing 21ml urine.  Patient

## 2017-04-13 NOTE — Progress Notes (Signed)
Patient complaining of not having his night medications. Urologist on call notified and orders were placed to start back Cardizem, Lopressor, and Lasix which the patient takes 2 times a day. Patient also has a cardiac history along with Afib so order was also placed for cardiac monitoring. Patient experiencing slight wheezing on exhale and some hypertension. See MAR for medications given. Will continue to monitor patient closely.

## 2017-04-15 ENCOUNTER — Encounter (HOSPITAL_COMMUNITY): Payer: Self-pay | Admitting: Urology

## 2017-04-17 ENCOUNTER — Inpatient Hospital Stay (HOSPITAL_COMMUNITY): Payer: Medicare Other

## 2017-04-17 ENCOUNTER — Emergency Department (HOSPITAL_COMMUNITY): Payer: Medicare Other

## 2017-04-17 ENCOUNTER — Other Ambulatory Visit: Payer: Self-pay | Admitting: Pulmonary Disease

## 2017-04-17 ENCOUNTER — Encounter (HOSPITAL_COMMUNITY): Payer: Self-pay | Admitting: Pulmonary Disease

## 2017-04-17 ENCOUNTER — Inpatient Hospital Stay (HOSPITAL_COMMUNITY)
Admission: EM | Admit: 2017-04-17 | Discharge: 2017-04-19 | DRG: 682 | Disposition: E | Payer: Medicare Other | Attending: Pulmonary Disease | Admitting: Pulmonary Disease

## 2017-04-17 DIAGNOSIS — I482 Chronic atrial fibrillation: Secondary | ICD-10-CM | POA: Diagnosis present

## 2017-04-17 DIAGNOSIS — J9601 Acute respiratory failure with hypoxia: Secondary | ICD-10-CM

## 2017-04-17 DIAGNOSIS — K8020 Calculus of gallbladder without cholecystitis without obstruction: Secondary | ICD-10-CM | POA: Diagnosis not present

## 2017-04-17 DIAGNOSIS — E669 Obesity, unspecified: Secondary | ICD-10-CM | POA: Diagnosis present

## 2017-04-17 DIAGNOSIS — I4729 Other ventricular tachycardia: Secondary | ICD-10-CM

## 2017-04-17 DIAGNOSIS — R6521 Severe sepsis with septic shock: Secondary | ICD-10-CM | POA: Diagnosis not present

## 2017-04-17 DIAGNOSIS — E1129 Type 2 diabetes mellitus with other diabetic kidney complication: Secondary | ICD-10-CM

## 2017-04-17 DIAGNOSIS — E1151 Type 2 diabetes mellitus with diabetic peripheral angiopathy without gangrene: Secondary | ICD-10-CM | POA: Diagnosis present

## 2017-04-17 DIAGNOSIS — R945 Abnormal results of liver function studies: Secondary | ICD-10-CM | POA: Diagnosis present

## 2017-04-17 DIAGNOSIS — Z7901 Long term (current) use of anticoagulants: Secondary | ICD-10-CM | POA: Diagnosis not present

## 2017-04-17 DIAGNOSIS — Z833 Family history of diabetes mellitus: Secondary | ICD-10-CM

## 2017-04-17 DIAGNOSIS — E875 Hyperkalemia: Secondary | ICD-10-CM | POA: Diagnosis not present

## 2017-04-17 DIAGNOSIS — Z87891 Personal history of nicotine dependence: Secondary | ICD-10-CM

## 2017-04-17 DIAGNOSIS — Z8249 Family history of ischemic heart disease and other diseases of the circulatory system: Secondary | ICD-10-CM | POA: Diagnosis not present

## 2017-04-17 DIAGNOSIS — Z452 Encounter for adjustment and management of vascular access device: Secondary | ICD-10-CM | POA: Diagnosis not present

## 2017-04-17 DIAGNOSIS — I469 Cardiac arrest, cause unspecified: Secondary | ICD-10-CM | POA: Diagnosis not present

## 2017-04-17 DIAGNOSIS — N19 Unspecified kidney failure: Secondary | ICD-10-CM

## 2017-04-17 DIAGNOSIS — G9341 Metabolic encephalopathy: Secondary | ICD-10-CM

## 2017-04-17 DIAGNOSIS — I472 Ventricular tachycardia: Secondary | ICD-10-CM | POA: Diagnosis present

## 2017-04-17 DIAGNOSIS — K7200 Acute and subacute hepatic failure without coma: Secondary | ICD-10-CM | POA: Diagnosis present

## 2017-04-17 DIAGNOSIS — E872 Acidosis, unspecified: Secondary | ICD-10-CM

## 2017-04-17 DIAGNOSIS — R188 Other ascites: Secondary | ICD-10-CM | POA: Diagnosis not present

## 2017-04-17 DIAGNOSIS — I5042 Chronic combined systolic (congestive) and diastolic (congestive) heart failure: Secondary | ICD-10-CM | POA: Diagnosis present

## 2017-04-17 DIAGNOSIS — Z515 Encounter for palliative care: Secondary | ICD-10-CM

## 2017-04-17 DIAGNOSIS — N179 Acute kidney failure, unspecified: Principal | ICD-10-CM | POA: Diagnosis present

## 2017-04-17 DIAGNOSIS — R100 Acute abdomen: Secondary | ICD-10-CM | POA: Diagnosis not present

## 2017-04-17 DIAGNOSIS — E1122 Type 2 diabetes mellitus with diabetic chronic kidney disease: Secondary | ICD-10-CM | POA: Diagnosis present

## 2017-04-17 DIAGNOSIS — Z794 Long term (current) use of insulin: Secondary | ICD-10-CM

## 2017-04-17 DIAGNOSIS — E1142 Type 2 diabetes mellitus with diabetic polyneuropathy: Secondary | ICD-10-CM | POA: Diagnosis present

## 2017-04-17 DIAGNOSIS — Z888 Allergy status to other drugs, medicaments and biological substances status: Secondary | ICD-10-CM

## 2017-04-17 DIAGNOSIS — I447 Left bundle-branch block, unspecified: Secondary | ICD-10-CM | POA: Diagnosis present

## 2017-04-17 DIAGNOSIS — I429 Cardiomyopathy, unspecified: Secondary | ICD-10-CM

## 2017-04-17 DIAGNOSIS — I255 Ischemic cardiomyopathy: Secondary | ICD-10-CM | POA: Diagnosis present

## 2017-04-17 DIAGNOSIS — N184 Chronic kidney disease, stage 4 (severe): Secondary | ICD-10-CM | POA: Diagnosis present

## 2017-04-17 DIAGNOSIS — Z95810 Presence of automatic (implantable) cardiac defibrillator: Secondary | ICD-10-CM

## 2017-04-17 DIAGNOSIS — I129 Hypertensive chronic kidney disease with stage 1 through stage 4 chronic kidney disease, or unspecified chronic kidney disease: Secondary | ICD-10-CM | POA: Diagnosis not present

## 2017-04-17 DIAGNOSIS — G9349 Other encephalopathy: Secondary | ICD-10-CM | POA: Diagnosis present

## 2017-04-17 DIAGNOSIS — N189 Chronic kidney disease, unspecified: Secondary | ICD-10-CM | POA: Diagnosis not present

## 2017-04-17 DIAGNOSIS — I2510 Atherosclerotic heart disease of native coronary artery without angina pectoris: Secondary | ICD-10-CM | POA: Diagnosis present

## 2017-04-17 DIAGNOSIS — R0602 Shortness of breath: Secondary | ICD-10-CM | POA: Diagnosis not present

## 2017-04-17 DIAGNOSIS — Z978 Presence of other specified devices: Secondary | ICD-10-CM

## 2017-04-17 DIAGNOSIS — N529 Male erectile dysfunction, unspecified: Secondary | ICD-10-CM | POA: Diagnosis present

## 2017-04-17 DIAGNOSIS — I130 Hypertensive heart and chronic kidney disease with heart failure and stage 1 through stage 4 chronic kidney disease, or unspecified chronic kidney disease: Secondary | ICD-10-CM | POA: Diagnosis present

## 2017-04-17 DIAGNOSIS — J9811 Atelectasis: Secondary | ICD-10-CM | POA: Diagnosis not present

## 2017-04-17 DIAGNOSIS — A419 Sepsis, unspecified organism: Secondary | ICD-10-CM

## 2017-04-17 DIAGNOSIS — Z7189 Other specified counseling: Secondary | ICD-10-CM

## 2017-04-17 DIAGNOSIS — R109 Unspecified abdominal pain: Secondary | ICD-10-CM | POA: Diagnosis not present

## 2017-04-17 DIAGNOSIS — I10 Essential (primary) hypertension: Secondary | ICD-10-CM | POA: Diagnosis present

## 2017-04-17 DIAGNOSIS — T82538A Leakage of other cardiac and vascular devices and implants, initial encounter: Secondary | ICD-10-CM

## 2017-04-17 DIAGNOSIS — E8729 Other acidosis: Secondary | ICD-10-CM | POA: Diagnosis present

## 2017-04-17 DIAGNOSIS — R1084 Generalized abdominal pain: Secondary | ICD-10-CM | POA: Diagnosis not present

## 2017-04-17 DIAGNOSIS — Z6828 Body mass index (BMI) 28.0-28.9, adult: Secondary | ICD-10-CM

## 2017-04-17 DIAGNOSIS — R7989 Other specified abnormal findings of blood chemistry: Secondary | ICD-10-CM | POA: Diagnosis present

## 2017-04-17 DIAGNOSIS — Z951 Presence of aortocoronary bypass graft: Secondary | ICD-10-CM | POA: Diagnosis not present

## 2017-04-17 DIAGNOSIS — Z66 Do not resuscitate: Secondary | ICD-10-CM | POA: Diagnosis not present

## 2017-04-17 DIAGNOSIS — R4020 Unspecified coma: Secondary | ICD-10-CM | POA: Diagnosis not present

## 2017-04-17 DIAGNOSIS — M898X9 Other specified disorders of bone, unspecified site: Secondary | ICD-10-CM | POA: Diagnosis present

## 2017-04-17 DIAGNOSIS — R479 Unspecified speech disturbances: Secondary | ICD-10-CM | POA: Diagnosis present

## 2017-04-17 LAB — COMPREHENSIVE METABOLIC PANEL
ALBUMIN: 3.5 g/dL (ref 3.5–5.0)
ALBUMIN: 2.4 g/dL — AB (ref 3.5–5.0)
ALK PHOS: 114 U/L (ref 38–126)
ALT: 493 U/L — ABNORMAL HIGH (ref 17–63)
ALT: 942 U/L — AB (ref 17–63)
AST: 864 U/L — AB (ref 15–41)
AST: 1826 U/L — AB (ref 15–41)
Alkaline Phosphatase: 90 U/L (ref 38–126)
Anion gap: 16 — ABNORMAL HIGH (ref 5–15)
Anion gap: 18 — ABNORMAL HIGH (ref 5–15)
BILIRUBIN TOTAL: 1.2 mg/dL (ref 0.3–1.2)
BILIRUBIN TOTAL: 0.9 mg/dL (ref 0.3–1.2)
BUN: 189 mg/dL — AB (ref 6–20)
BUN: 166 mg/dL — AB (ref 6–20)
CO2: 22 mmol/L (ref 22–32)
CO2: 18 mmol/L — ABNORMAL LOW (ref 22–32)
CREATININE: 7.13 mg/dL — AB (ref 0.61–1.24)
CREATININE: 6.37 mg/dL — AB (ref 0.61–1.24)
Calcium: 8.1 mg/dL — ABNORMAL LOW (ref 8.9–10.3)
Calcium: 8.7 mg/dL — ABNORMAL LOW (ref 8.9–10.3)
Chloride: 96 mmol/L — ABNORMAL LOW (ref 101–111)
Chloride: 101 mmol/L (ref 101–111)
GFR calc Af Amer: 8 mL/min — ABNORMAL LOW (ref >60)
GFR calc Af Amer: 9 mL/min — ABNORMAL LOW (ref >60)
GFR calc non Af Amer: 7 mL/min — ABNORMAL LOW (ref >60)
GFR, EST NON AFRICAN AMERICAN: 8 mL/min — AB (ref >60)
GLUCOSE: 203 mg/dL — AB (ref 65–99)
GLUCOSE: 386 mg/dL — AB (ref 65–99)
POTASSIUM: 5.1 mmol/L (ref 3.5–5.1)
Potassium: 7.3 mmol/L (ref 3.5–5.1)
Sodium: 134 mmol/L — ABNORMAL LOW (ref 135–145)
Sodium: 137 mmol/L (ref 135–145)
TOTAL PROTEIN: 6.4 g/dL — AB (ref 6.5–8.1)
TOTAL PROTEIN: 4.2 g/dL — AB (ref 6.5–8.1)

## 2017-04-17 LAB — BLOOD GAS, ARTERIAL
ACID-BASE DEFICIT: 9.7 mmol/L — AB (ref 0.0–2.0)
BICARBONATE: 19.2 mmol/L — AB (ref 20.0–28.0)
DRAWN BY: 225631
O2 CONTENT: 4 L/min
O2 Saturation: 96.3 %
PATIENT TEMPERATURE: 97.4
RATE: 18 resp/min
pCO2 arterial: 56.2 mmHg — ABNORMAL HIGH (ref 32.0–48.0)
pH, Arterial: 7.156 — CL (ref 7.350–7.450)
pO2, Arterial: 114 mmHg — ABNORMAL HIGH (ref 83.0–108.0)

## 2017-04-17 LAB — CBC WITH DIFFERENTIAL/PLATELET
BASOS ABS: 0 10*3/uL (ref 0.0–0.1)
BASOS PCT: 0 %
EOS ABS: 0 10*3/uL (ref 0.0–0.7)
EOS PCT: 0 %
HCT: 40.1 % (ref 39.0–52.0)
Hemoglobin: 12.7 g/dL — ABNORMAL LOW (ref 13.0–17.0)
Lymphocytes Relative: 7 %
Lymphs Abs: 1.4 10*3/uL (ref 0.7–4.0)
MCH: 33.8 pg (ref 26.0–34.0)
MCHC: 31.7 g/dL (ref 30.0–36.0)
MCV: 106.6 fL — AB (ref 78.0–100.0)
MONO ABS: 0.8 10*3/uL (ref 0.1–1.0)
Monocytes Relative: 4 %
Neutro Abs: 16.9 10*3/uL — ABNORMAL HIGH (ref 1.7–7.7)
Neutrophils Relative %: 89 %
PLATELETS: 216 10*3/uL (ref 150–400)
RBC: 3.76 MIL/uL — ABNORMAL LOW (ref 4.22–5.81)
RDW: 15.1 % (ref 11.5–15.5)
WBC: 19.1 10*3/uL — ABNORMAL HIGH (ref 4.0–10.5)

## 2017-04-17 LAB — CBC
HEMATOCRIT: 33.5 % — AB (ref 39.0–52.0)
HEMOGLOBIN: 10.4 g/dL — AB (ref 13.0–17.0)
MCH: 33.2 pg (ref 26.0–34.0)
MCHC: 31 g/dL (ref 30.0–36.0)
MCV: 107 fL — AB (ref 78.0–100.0)
Platelets: 132 10*3/uL — ABNORMAL LOW (ref 150–400)
RBC: 3.13 MIL/uL — AB (ref 4.22–5.81)
RDW: 15 % (ref 11.5–15.5)
WBC: 16.6 10*3/uL — AB (ref 4.0–10.5)

## 2017-04-17 LAB — GLUCOSE, CAPILLARY: Glucose-Capillary: 211 mg/dL — ABNORMAL HIGH (ref 65–99)

## 2017-04-17 LAB — I-STAT CHEM 8, ED
BUN: >140 mg/dL — ABNORMAL HIGH (ref 6–20)
BUN: >140 mg/dL — ABNORMAL HIGH (ref 6–20)
CALCIUM ION: 1 mmol/L — AB (ref 1.15–1.40)
CALCIUM ION: 1.04 mmol/L — AB (ref 1.15–1.40)
CHLORIDE: 98 mmol/L — AB (ref 101–111)
CHLORIDE: 102 mmol/L (ref 101–111)
Creatinine, Ser: 6.7 mg/dL — ABNORMAL HIGH (ref 0.61–1.24)
Creatinine, Ser: 6.7 mg/dL — ABNORMAL HIGH (ref 0.61–1.24)
GLUCOSE: 185 mg/dL — AB (ref 65–99)
Glucose, Bld: 150 mg/dL — ABNORMAL HIGH (ref 65–99)
HCT: 41 % (ref 39.0–52.0)
HEMATOCRIT: 36 % — AB (ref 39.0–52.0)
Hemoglobin: 13.9 g/dL (ref 13.0–17.0)
Hemoglobin: 12.2 g/dL — ABNORMAL LOW (ref 13.0–17.0)
Potassium: 7.1 mmol/L (ref 3.5–5.1)
Potassium: 6.7 mmol/L (ref 3.5–5.1)
SODIUM: 131 mmol/L — AB (ref 135–145)
SODIUM: 134 mmol/L — AB (ref 135–145)
TCO2: 24 mmol/L (ref 22–32)
TCO2: 25 mmol/L (ref 22–32)

## 2017-04-17 LAB — POCT I-STAT 3, ART BLOOD GAS (G3+)
ACID-BASE DEFICIT: 6 mmol/L — AB (ref 0.0–2.0)
Acid-base deficit: 6 mmol/L — ABNORMAL HIGH (ref 0.0–2.0)
BICARBONATE: 19.9 mmol/L — AB (ref 20.0–28.0)
Bicarbonate: 24.6 mmol/L (ref 20.0–28.0)
O2 SAT: 100 %
O2 Saturation: 98 %
PH ART: 7.333 — AB (ref 7.350–7.450)
TCO2: 27 mmol/L (ref 22–32)
TCO2: 21 mmol/L — ABNORMAL LOW (ref 22–32)
pCO2 arterial: 79.9 mmHg (ref 32.0–48.0)
pCO2 arterial: 36.5 mmHg (ref 32.0–48.0)
pH, Arterial: 7.096 — CL (ref 7.350–7.450)
pO2, Arterial: 419 mmHg — ABNORMAL HIGH (ref 83.0–108.0)
pO2, Arterial: 106 mmHg (ref 83.0–108.0)

## 2017-04-17 LAB — URINALYSIS, ROUTINE W REFLEX MICROSCOPIC
Bilirubin Urine: NEGATIVE
Glucose, UA: 50 mg/dL — AB
Ketones, ur: 5 mg/dL — AB
LEUKOCYTES UA: NEGATIVE
Nitrite: NEGATIVE
Protein, ur: 100 mg/dL — AB
SPECIFIC GRAVITY, URINE: 1.016 (ref 1.005–1.030)
SQUAMOUS EPITHELIAL / LPF: NONE SEEN
pH: 5 (ref 5.0–8.0)

## 2017-04-17 LAB — RENAL FUNCTION PANEL
Albumin: 2.8 g/dL — ABNORMAL LOW (ref 3.5–5.0)
Anion gap: 13 (ref 5–15)
BUN: 160 mg/dL — ABNORMAL HIGH (ref 6–20)
CO2: 24 mmol/L (ref 22–32)
Calcium: 7.3 mg/dL — ABNORMAL LOW (ref 8.9–10.3)
Chloride: 100 mmol/L — ABNORMAL LOW (ref 101–111)
Creatinine, Ser: 6.3 mg/dL — ABNORMAL HIGH (ref 0.61–1.24)
GFR calc Af Amer: 9 mL/min — ABNORMAL LOW (ref >60)
GFR calc non Af Amer: 8 mL/min — ABNORMAL LOW (ref >60)
Glucose, Bld: 235 mg/dL — ABNORMAL HIGH (ref 65–99)
Phosphorus: 8.8 mg/dL — ABNORMAL HIGH (ref 2.5–4.6)
Potassium: 6 mmol/L — ABNORMAL HIGH (ref 3.5–5.1)
Sodium: 137 mmol/L (ref 135–145)

## 2017-04-17 LAB — LACTIC ACID, PLASMA
LACTIC ACID, VENOUS: 1.1 mmol/L (ref 0.5–1.9)
Lactic Acid, Venous: 6.7 mmol/L (ref 0.5–1.9)

## 2017-04-17 LAB — I-STAT TROPONIN, ED: Troponin i, poc: 0.2 ng/mL (ref 0.00–0.08)

## 2017-04-17 LAB — I-STAT CG4 LACTIC ACID, ED: LACTIC ACID, VENOUS: 2.76 mmol/L — AB (ref 0.5–1.9)

## 2017-04-17 LAB — PROTIME-INR
INR: 1.91
INR: 2.61
PROTHROMBIN TIME: 21.7 s — AB (ref 11.4–15.2)
PROTHROMBIN TIME: 27.7 s — AB (ref 11.4–15.2)

## 2017-04-17 LAB — LIPASE, BLOOD: LIPASE: 152 U/L — AB (ref 11–51)

## 2017-04-17 LAB — TROPONIN I: Troponin I: 0.26 ng/mL (ref <0.03)

## 2017-04-17 LAB — TYPE AND SCREEN
ABO/RH(D): A POS
Antibody Screen: NEGATIVE

## 2017-04-17 LAB — ABO/RH: ABO/RH(D): A POS

## 2017-04-17 LAB — MRSA PCR SCREENING: MRSA by PCR: NEGATIVE

## 2017-04-17 LAB — AMMONIA: Ammonia: 48 umol/L — ABNORMAL HIGH (ref 9–35)

## 2017-04-17 LAB — PROCALCITONIN: PROCALCITONIN: 1.34 ng/mL

## 2017-04-17 LAB — CBG MONITORING, ED
GLUCOSE-CAPILLARY: 186 mg/dL — AB (ref 65–99)
Glucose-Capillary: 151 mg/dL — ABNORMAL HIGH (ref 65–99)

## 2017-04-17 LAB — POTASSIUM: POTASSIUM: 6.5 mmol/L — AB (ref 3.5–5.1)

## 2017-04-17 MED ORDER — HEPARIN SODIUM (PORCINE) 5000 UNIT/ML IJ SOLN
3000.0000 [IU] | INTRAMUSCULAR | Status: AC
  Administered 2017-04-17: 3000 [IU] via INTRAVENOUS
  Filled 2017-04-17: qty 0.6

## 2017-04-17 MED ORDER — HEPARIN BOLUS VIA INFUSION (CRRT)
1000.0000 [IU] | INTRAVENOUS | Status: DC | PRN
  Filled 2017-04-17: qty 1000

## 2017-04-17 MED ORDER — PRISMASOL BGK 0/2.5 32-2.5 MEQ/L IV SOLN
INTRAVENOUS | Status: DC
  Administered 2017-04-17 (×2): via INTRAVENOUS_CENTRAL
  Filled 2017-04-17 (×4): qty 5000

## 2017-04-17 MED ORDER — PRISMASOL BGK 0/2.5 32-2.5 MEQ/L IV SOLN
INTRAVENOUS | Status: DC
  Administered 2017-04-17 (×3): via INTRAVENOUS_CENTRAL
  Filled 2017-04-17 (×9): qty 5000

## 2017-04-17 MED ORDER — SODIUM BICARBONATE 8.4 % IV SOLN
INTRAVENOUS | Status: DC
  Administered 2017-04-17 (×2): via INTRAVENOUS
  Filled 2017-04-17 (×3): qty 150

## 2017-04-17 MED ORDER — SODIUM POLYSTYRENE SULFONATE PO POWD
30.0000 g | Freq: Once | ORAL | Status: DC
  Filled 2017-04-17: qty 30

## 2017-04-17 MED ORDER — VANCOMYCIN HCL IN DEXTROSE 1-5 GM/200ML-% IV SOLN
1000.0000 mg | INTRAVENOUS | Status: DC
Start: 2017-04-18 — End: 2017-04-17

## 2017-04-17 MED ORDER — ALBUTEROL (5 MG/ML) CONTINUOUS INHALATION SOLN
INHALATION_SOLUTION | RESPIRATORY_TRACT | Status: AC
  Filled 2017-04-17: qty 20

## 2017-04-17 MED ORDER — CALCIUM GLUCONATE 10 % IV SOLN: 1.0000 g | Freq: Once | INTRAVENOUS | Status: DC

## 2017-04-17 MED ORDER — SODIUM CHLORIDE 0.9 % FOR CRRT
INTRAVENOUS_CENTRAL | Status: DC | PRN
  Filled 2017-04-17: qty 1000

## 2017-04-17 MED ORDER — DEXTROSE 5 % IV SOLN
INTRAVENOUS | Status: DC
  Filled 2017-04-17: qty 1000

## 2017-04-17 MED ORDER — ALBUTEROL SULFATE (2.5 MG/3ML) 0.083% IN NEBU
5.0000 mg | INHALATION_SOLUTION | Freq: Once | RESPIRATORY_TRACT | Status: AC
  Administered 2017-04-17: 5 mg via RESPIRATORY_TRACT

## 2017-04-17 MED ORDER — FENTANYL CITRATE (PF) 100 MCG/2ML IJ SOLN
50.0000 ug | Freq: Once | INTRAMUSCULAR | Status: AC
  Administered 2017-04-17: 50 ug via INTRAVENOUS
  Filled 2017-04-17: qty 2

## 2017-04-17 MED ORDER — SODIUM CHLORIDE 0.9 % IV BOLUS (SEPSIS)
1000.0000 mL | Freq: Once | INTRAVENOUS | Status: AC
  Administered 2017-04-17: 1000 mL via INTRAVENOUS

## 2017-04-17 MED ORDER — SODIUM CHLORIDE 0.9 % IJ SOLN
250.0000 [IU]/h | INTRAMUSCULAR | Status: DC
  Administered 2017-04-17: 250 [IU]/h via INTRAVENOUS_CENTRAL
  Filled 2017-04-17: qty 2

## 2017-04-17 MED ORDER — SODIUM CHLORIDE 0.9 % IV SOLN
1.0000 g | Freq: Once | INTRAVENOUS | Status: AC
  Administered 2017-04-17: 1 g via INTRAVENOUS

## 2017-04-17 MED ORDER — INSULIN ASPART 100 UNIT/ML IV SOLN
6.0000 [IU] | Freq: Once | INTRAVENOUS | Status: AC
  Administered 2017-04-17: 6 [IU] via INTRAVENOUS
  Filled 2017-04-17: qty 0.06

## 2017-04-17 MED ORDER — VANCOMYCIN HCL 10 G IV SOLR
1500.0000 mg | Freq: Once | INTRAVENOUS | Status: AC
  Administered 2017-04-17: 1500 mg via INTRAVENOUS
  Filled 2017-04-17: qty 1500

## 2017-04-17 MED ORDER — CALCIUM GLUCONATE 10 % IV SOLN
1.0000 g | Freq: Once | INTRAVENOUS | Status: DC
  Filled 2017-04-17: qty 10

## 2017-04-17 MED ORDER — DEXTROSE 50 % IV SOLN
1.0000 | Freq: Once | INTRAVENOUS | Status: AC
  Administered 2017-04-17: 50 mL via INTRAVENOUS
  Filled 2017-04-17: qty 50

## 2017-04-17 MED ORDER — SODIUM BICARBONATE 8.4 % IV SOLN
50.0000 meq | Freq: Once | INTRAVENOUS | Status: AC
  Administered 2017-04-17: 50 meq via INTRAVENOUS
  Filled 2017-04-17: qty 50

## 2017-04-17 MED ORDER — INSULIN ASPART 100 UNIT/ML IV SOLN
10.0000 [IU] | Freq: Once | INTRAVENOUS | Status: AC
  Administered 2017-04-17: 10 [IU] via INTRAVENOUS
  Filled 2017-04-17: qty 0.1

## 2017-04-17 MED ORDER — DEXTROSE 5 % IV SOLN
INTRAVENOUS | Status: DC
  Filled 2017-04-17: qty 850

## 2017-04-17 MED ORDER — HEPARIN SODIUM (PORCINE) 1000 UNIT/ML IJ SOLN
3000.0000 [IU] | Freq: Once | INTRAMUSCULAR | Status: AC
  Administered 2017-04-17: 3000 [IU] via INTRAVENOUS
  Filled 2017-04-17: qty 3

## 2017-04-17 MED ORDER — SODIUM CHLORIDE 0.9 % IV SOLN
1.0000 g | Freq: Once | INTRAVENOUS | Status: AC
  Administered 2017-04-17: 1 g via INTRAVENOUS
  Filled 2017-04-17: qty 10

## 2017-04-17 MED ORDER — HEPARIN SODIUM (PORCINE) 1000 UNIT/ML DIALYSIS
1000.0000 [IU] | INTRAMUSCULAR | Status: DC | PRN
  Administered 2017-04-17: 2800 [IU] via INTRAVENOUS_CENTRAL
  Filled 2017-04-17 (×2): qty 6

## 2017-04-17 MED ORDER — PIPERACILLIN-TAZOBACTAM 3.375 G IVPB
3.3750 g | Freq: Four times a day (QID) | INTRAVENOUS | Status: DC
  Filled 2017-04-17: qty 50

## 2017-04-17 MED ORDER — FENTANYL CITRATE (PF) 100 MCG/2ML IJ SOLN: 50.0000 ug | INTRAMUSCULAR | Status: DC | PRN

## 2017-04-17 MED ORDER — NOREPINEPHRINE BITARTRATE 1 MG/ML IV SOLN
0.0000 ug/min | INTRAVENOUS | Status: DC
  Administered 2017-04-17: 40 ug/min via INTRAVENOUS
  Filled 2017-04-17: qty 4

## 2017-04-17 MED ORDER — PIPERACILLIN-TAZOBACTAM IN DEX 2-0.25 GM/50ML IV SOLN
2.2500 g | Freq: Four times a day (QID) | INTRAVENOUS | Status: DC
  Administered 2017-04-17: 2.25 g via INTRAVENOUS
  Filled 2017-04-17 (×3): qty 50

## 2017-04-17 MED ORDER — LACTATED RINGERS IV SOLN: INTRAVENOUS | Status: DC

## 2017-04-17 MED ORDER — SODIUM BICARBONATE 8.4 % IV SOLN
INTRAVENOUS | Status: AC
  Administered 2017-04-17: 15:00:00
  Filled 2017-04-17: qty 100

## 2017-04-17 MED ORDER — FENTANYL CITRATE (PF) 100 MCG/2ML IJ SOLN
50.0000 ug | INTRAMUSCULAR | Status: DC | PRN
  Administered 2017-04-17: 50 ug via INTRAVENOUS
  Filled 2017-04-17: qty 2

## 2017-04-17 MED ORDER — CALCIUM GLUCONATE 10 % IV SOLN
1.0000 g | Freq: Once | INTRAVENOUS | Status: AC
  Administered 2017-04-17: 1 g via INTRAVENOUS
  Filled 2017-04-17 (×2): qty 10

## 2017-04-17 MED ORDER — PIPERACILLIN-TAZOBACTAM 3.375 G IVPB 30 MIN
3.3750 g | Freq: Four times a day (QID) | INTRAVENOUS | Status: DC
  Administered 2017-04-17: 3.375 g via INTRAVENOUS
  Filled 2017-04-17 (×4): qty 50

## 2017-04-17 MED ORDER — SODIUM CHLORIDE 0.9 % IV SOLN
250.0000 mL | INTRAVENOUS | Status: DC | PRN
  Administered 2017-04-17: 250 mL via INTRAVENOUS

## 2017-04-17 MED ORDER — PRISMASOL BGK 0/2.5 32-2.5 MEQ/L IV SOLN
INTRAVENOUS | Status: DC
  Administered 2017-04-17 (×2): via INTRAVENOUS_CENTRAL
  Filled 2017-04-17 (×5): qty 5000

## 2017-04-17 MED ORDER — VASOPRESSIN 20 UNIT/ML IV SOLN
0.0300 [IU]/min | INTRAVENOUS | Status: DC
  Filled 2017-04-17: qty 2

## 2017-04-17 MED ORDER — PANTOPRAZOLE SODIUM 40 MG PO TBEC: 40.0000 mg | DELAYED_RELEASE_TABLET | Freq: Every day | ORAL | Status: DC

## 2017-04-17 MED ORDER — LACTATED RINGERS IV BOLUS (SEPSIS)
2000.0000 mL | Freq: Once | INTRAVENOUS | Status: DC
  Administered 2017-04-17: 2000 mL via INTRAVENOUS

## 2017-04-17 MED FILL — Medication: Qty: 1 | Status: AC

## 2017-04-18 LAB — PARATHYROID HORMONE, INTACT (NO CA): PTH: 620 pg/mL — AB (ref 15–65)

## 2017-04-18 LAB — URINE CULTURE: Culture: NO GROWTH

## 2017-04-19 NOTE — Procedures (Signed)
Intubation Procedure Note Maxwell Fitzgerald [id number] [birth year]  Procedure: Intubation Indications: Respiratory insufficiency  Procedure Details Consent: Unable to obtain consent because of emergent medical necessity. Time Out: Verified patient identification, verified procedure, site/side was marked, verified correct patient position, special equipment/implants available, medications/allergies/relevent history reviewed, required imaging and test results available.  Performed  Maximum sterile technique was used including gloves, hand hygiene and mask.  MAC    Evaluation Hemodynamic Status: BP stable throughout; O2 sats: stable throughout Patient's Current Condition: stable Complications: No apparent complications Patient did tolerate procedure well. Chest X-ray ordered to verify placement.  CXR: pending.   Maxwell Fitzgerald Apr 20, 2017

## 2017-04-19 NOTE — ED Notes (Signed)
[facility] [employer]

## 2017-04-19 NOTE — Consult Note (Signed)
Reason for Consult:AKI, hyperkalemia Referring Physician: Dr . Maxwell Fitzgerald is an 67 y.o. male.  HPI: 67 yr male with extensive PMH, CM with EF 40%, ICD/ defib,  HTN , DM long term, CAD, PVD with leg procedures, CKD 4 followed by Dr. Mercy Moore.  Last seen 8/18 with Cr of 2.8.  Presents now with 4-5 d of N, V, lethargy, feeling miserable, severely weak.  Found to have Cr 7.1, K 7.3. Mildly low bicarb of 22 and resp acidosis.  Had Penile implant on 2/22 and Cr was 3.33, K 5.4.  On losartan, naproxen, taking Kayexalate 2X/wk.  Had resp arrest but spont recovery at [facility], and noted to be lethargic and mild confuison before that.. Constitutional: as above Eyes: DM retinal dz Ears, nose, mouth, throat, and face: negative Respiratory: more SOB Cardiovascular: edema Gastrointestinal: V,N Genitourinary:little urine Integument/breast: severe bruising Neurological: as above, knows he is not thinking straight Endocrine: DM Allergic/Immunologic: None known Primary Nephrologist Maxwell Fitzgerald.   Past Medical History:  Diagnosis Date  . AICD (automatic cardioverter/defibrillator) present   . Anxiety   . Atrial fibrillation, permanent ([place])    on warfarin; recurred after DCCV  . Biventricular ICD (implantable cardioverter-defibrillator) in place    implanted by Dr. Lovena Le November 2014  . Cardiomyopathy, EF 30-35% by echo 03/2012 05/19/2012   Decreased from 2009 Echo, 40-45%  . CHF (congestive heart failure) ([place])   . Chronic anticoagulation    on coumadin  . CKD (chronic kidney disease) stage 3, GFR 30-59 ml/min (HCC)    Baseline BUN/Cr 09/2011 - 36/1.44  . Cold    with cough clear mucous since 04-07-17  . Coronary artery disease    s/p CABG 1998; Myoview 2008 mild septal ischemia -no cath,  . Hypertension   . LBBB (left bundle branch block)    chronic  . PAD (peripheral artery disease) ([place]) 1997   Bilateral SFA PTA in 1997  . Peripheral neuropathy    4 toes left foot great and small  toe, right foot great and small toe  . Renal disorder   . Type 2 diabetes mellitus ([place])    type 2 x 28  yrs    Past Surgical History:  Procedure Laterality Date  . BI-VENTRICULAR IMPLANTABLE CARDIOVERTER DEFIBRILLATOR N/A 01/08/2013   Procedure: BI-VENTRICULAR IMPLANTABLE CARDIOVERTER DEFIBRILLATOR  (CRT-D);  Surgeon: Evans Lance, MD;  Location: [facility] CATH LAB;  Service: Cardiovascular;  Laterality: N/A;  . BI-VENTRICULAR IMPLANTABLE CARDIOVERTER DEFIBRILLATOR  (CRT-D)  01-08-2013   STJ CRTD implanted by Dr Lovena Le for ischemic cardiomyopathy, CHF, and LBBB  . CORONARY ARTERY BYPASS GRAFT  03/02/1996   x7, LIMA-LAD, SVG-OM1, SVG-OM2, SVG-OM3, SVG-PDA, SVG-PLA  . KNEE ARTHROSCOPY Right 03/07/2015   Procedure: ARTHROSCOPY RIGHT KNEE WITH DEBRIDEMENT;  Surgeon: Paralee Cancel, MD;  Location: [facility] ORS;  Service: Orthopedics;  Laterality: Right;  . PENILE PROSTHESIS IMPLANT N/A 04/12/2017   Procedure: PENILE PROTHESIS INFLATABLE;  Surgeon: Kathie Rhodes, MD;  Location: [facility] ORS;  Service: Urology;  Laterality: N/A;  . PERIPHERAL VASCULAR INTERVENTION  06/1996   Bilateral SFA PTA  . PERIPHERAL VASCULAR INTERVENTION  06/30/1996   R SFA-high grade 95% focal stenosis crossed w/ a 5x61m Cordis "Opti-5" balloon dilated at 5atm-56sec, 7atm-90sec, and 5atm-120sec    Family History  Problem Relation Age of Onset  . CAD Mother   . Diabetes Mother   . CAD Father   . Diabetes Father   . CAD Brother     Social History:  reports that  he quit smoking about 21 years ago. He has a 30.00 pack-year smoking history. he has never used smokeless tobacco. He reports that he drinks alcohol. He reports that he does not use drugs.  Allergies:  Allergies  Allergen Reactions  . Potassium Chloride Other (See Comments)    Kidney failure & heart EF issues - in large doses  . Potassium-Containing Compounds Other (See Comments)    Kidney failure: Specifically Potassium Chloride - in large doses  . Metformin And Related  Other (See Comments)    Bothered liver     Medications:  I have reviewed the patient's current medications. Prior to Admission:  Medications Prior to Admission  Medication Sig Dispense Refill Last Dose  . allopurinol (ZYLOPRIM) 100 MG tablet Take 100 mg by mouth 2 (two) times daily.    Past Week at Unknown time  . amoxicillin-clavulanate (AUGMENTIN) 875-125 MG tablet Take 1 tablet by mouth 2 (two) times daily for 10 days. 14 tablet 0 04/16/2017 at Unknown time  . atorvastatin (LIPITOR) 20 MG tablet Take 1 tablet (20 mg total) by mouth daily at 6 PM. (Patient taking differently: Take 20 mg by mouth daily. ) 30 tablet 0 04/16/2017 at Unknown time  . calcitRIOL (ROCALTROL) 0.25 MCG capsule Take 0.25 mcg by mouth every Monday, Wednesday, and Friday.    Past Week at Unknown time  . clonazePAM (KLONOPIN) 1 MG tablet Take 1 mg by mouth at bedtime.   04/16/2017 at Unknown time  . colchicine 0.6 MG tablet Take 0.6 mg by mouth daily as needed (for flare ups).    Past Week at Unknown time  . diltiazem (CARDIZEM) 60 MG tablet Take 60 mg by mouth 2 (two) times daily.   Past Week at Unknown time  . furosemide (LASIX) 40 MG tablet Take 40 mg by mouth 2 (two) times daily.   Past Week at Unknown time  . glimepiride (AMARYL) 2 MG tablet Take 2-4 mg by mouth See admin instructions. Take 2 mg by mouth in the morning and take 4 mg by mouth at bedtime   04/16/2017 at Unknown time  . guaiFENesin (MUCINEX) 600 MG 12 hr tablet Take 1,200 mg by mouth 2 (two) times daily as needed for cough.   04/16/2017 at Unknown time  . Insulin Glargine (BASAGLAR KWIKPEN) 100 UNIT/ML SOPN Inject 16-18 Units into the skin See admin instructions. Inject 18 units SQ in the morning and inject 16 units SQ in the evening   04/16/2017 at Unknown time  . losartan (COZAAR) 50 MG tablet Take 50 mg by mouth every evening.    04/16/2017 at Unknown time  . metoprolol (LOPRESSOR) 50 MG tablet Take 1 tablet (50 mg total) by mouth 2 (two) times daily. 180  tablet 3 04/16/2017 at 7pm  . metoprolol tartrate (LOPRESSOR) 25 MG tablet TAKE 3 TABLETS (75 MG TOTAL) BY MOUTH 2 (TWO) TIMES DAILY. (Patient taking differently: Take 25 mg by mouth twice daily. Take with 50 mg to = 75 mg) 180 tablet 5 04/16/2017 at 7pm  . naproxen sodium (ALEVE) 220 MG tablet Take 220 mg by mouth daily as needed (for pain or headache).   04/16/2017 at Unknown time  . Oxycodone HCl 10 MG TABS Take 1 tablet (10 mg total) by mouth every 4 (four) hours as needed. (Patient taking differently: Take 10 mg by mouth every 4 (four) hours as needed (pain). ) 20 tablet 0 04/16/2017 at Unknown time  . Phenylephrine-Acetaminophen (VICKS DAYQUIL SINUS PO) Take 30 mLs by  mouth every 8 (eight) hours as needed. Cough   Past Week at Unknown time  . sodium polystyrene (KAYEXALATE) 15 GM/60ML suspension Take 15 g by mouth every Wednesday.    Past Week at Unknown time  . triamcinolone cream (KENALOG) 0.1 % Apply 1 application topically every other day.   Past Week at Unknown time  . warfarin (COUMADIN) 5 MG tablet TAKE ONE TABLET BY MOUTH DAILY OR AS DIRECTED BY COUMADIN CLINIC (Patient taking differently: Take 7.5 mg by mouth daily on Sunday and Thursday. Take 5 mg by mouth daily on all other days) 90 tablet 0 Past Month at Unknown time  . Liniments (SALONPAS EX) Apply 1 patch topically daily as needed (for pain).   More than a month at Unknown time    Results for orders placed or performed during the hospital encounter of 04/30/2017 (from the past 48 hour(s))  ABO/Rh     Status: None   Collection Time: 04/30/2017  3:35 AM  Result Value Ref Range   ABO/RH(D)      A POS Performed at [facility] [address], [place], [place] [address]   Comprehensive metabolic panel     Status: Abnormal   Collection Time: 30-Apr-2017  3:52 AM  Result Value Ref Range   Sodium 134 (L) 135 - 145 mmol/L   Potassium 7.3 (HH) 3.5 - 5.1 mmol/L    Comment: NO VISIBLE HEMOLYSIS CRITICAL RESULT CALLED TO,  READ BACK BY AND VERIFIED WITH: OXIDINE,J RN 2017/04/30 '@[id number]'$  ZANDO,C    Chloride 96 (L) 101 - 111 mmol/L   CO2 22 22 - 32 mmol/L   Glucose, Bld 203 (H) 65 - 99 mg/dL   BUN 189 (H) 6 - 20 mg/dL    Comment: RESULTS CONFIRMED BY MANUAL DILUTION   Creatinine, Ser 7.13 (H) 0.61 - 1.24 mg/dL   Calcium 8.1 (L) 8.9 - 10.3 mg/dL   Total Protein 6.4 (L) 6.5 - 8.1 g/dL   Albumin 3.5 3.5 - 5.0 g/dL   AST 864 (H) 15 - 41 U/L   ALT 493 (H) 17 - 63 U/L   Alkaline Phosphatase 90 38 - 126 U/L   Total Bilirubin 1.2 0.3 - 1.2 mg/dL   GFR calc non Af Amer 7 (L) >60 mL/min   GFR calc Af Amer 8 (L) >60 mL/min    Comment: (NOTE) The eGFR has been calculated using the CKD EPI equation. This calculation has not been validated in all clinical situations. eGFR's persistently <60 mL/min signify possible Chronic Kidney Disease.    Anion gap 16 (H) 5 - 15    Comment: Performed at [facility], [place] [address] [place], [place] [address]  CBC with Differential/Platelet     Status: Abnormal   Collection Time: 04-30-17  3:52 AM  Result Value Ref Range   WBC 19.1 (H) 4.0 - 10.5 K/uL   RBC 3.76 (L) 4.22 - 5.81 MIL/uL   Hemoglobin 12.7 (L) 13.0 - 17.0 g/dL   HCT 40.1 39.0 - 52.0 %   MCV 106.6 (H) 78.0 - 100.0 fL   MCH 33.8 26.0 - 34.0 pg   MCHC 31.7 30.0 - 36.0 g/dL   RDW 15.1 11.5 - 15.5 %   Platelets 216 150 - 400 K/uL   Neutrophils Relative % 89 %   Neutro Abs 16.9 (H) 1.7 - 7.7 K/uL   Lymphocytes Relative 7 %   Lymphs Abs 1.4 0.7 - 4.0 K/uL   Monocytes Relative 4 %  Monocytes Absolute 0.8 0.1 - 1.0 K/uL   Eosinophils Relative 0 %   Eosinophils Absolute 0.0 0.0 - 0.7 K/uL   Basophils Relative 0 %   Basophils Absolute 0.0 0.0 - 0.1 K/uL    Comment: Performed at [facility], [place] [address], [place], [place] [address]  Protime-INR     Status: Abnormal   Collection Time: 04-18-17  3:52 AM  Result Value Ref Range   Prothrombin Time 21.7 (H) 11.4 - 15.2 seconds    INR 1.91     Comment: Performed at [facility], [place] [address], [place], [place] [address]  Lipase, blood     Status: Abnormal   Collection Time: 18-Apr-2017  3:52 AM  Result Value Ref Range   Lipase 152 (H) 11 - 51 U/L    Comment: Performed at [facility], [place] [address], [place], [place] [address]  CBG monitoring, ED     Status: Abnormal   Collection Time: 04/18/17  4:04 AM  Result Value Ref Range   Glucose-Capillary 186 (H) 65 - 99 mg/dL  Type and screen     Status: None   Collection Time: April 18, 2017  4:08 AM  Result Value Ref Range   ABO/RH(D) A POS    Antibody Screen NEG    Sample Expiration      04/20/2017 Performed at [facility], [place] [address] [place], [place] [address]   I-stat troponin, ED     Status: Abnormal   Collection Time: 18-Apr-2017  4:11 AM  Result Value Ref Range   Troponin i, poc 0.20 (HH) 0.00 - 0.08 ng/mL   Comment NOTIFIED PHYSICIAN    Comment 3            Comment: Due to the release kinetics of cTnI, a negative result within the first hours of the onset of symptoms does not rule out myocardial infarction with certainty. If myocardial infarction is still suspected, repeat the test at appropriate intervals.   I-Stat CG4 Lactic Acid, ED     Status: Abnormal   Collection Time: 04/18/17  4:13 AM  Result Value Ref Range   Lactic Acid, Venous 2.76 (HH) 0.5 - 1.9 mmol/L   Comment NOTIFIED PHYSICIAN   I-stat chem 8, ed     Status: Abnormal   Collection Time: 18-Apr-2017  4:13 AM  Result Value Ref Range   Sodium 131 (L) 135 - 145 mmol/L   Potassium 7.1 (HH) 3.5 - 5.1 mmol/L   Chloride 98 (L) 101 - 111 mmol/L   BUN >140 (H) 6 - 20 mg/dL   Creatinine, Ser 6.70 (H) 0.61 - 1.24 mg/dL   Glucose, Bld 185 (H) 65 - 99 mg/dL   Calcium, Ion 1.00 (L) 1.15 - 1.40 mmol/L   TCO2 24 22 - 32 mmol/L   Hemoglobin 13.9 13.0 - 17.0 g/dL   HCT 41.0 39.0 - 52.0 %   Comment NOTIFIED PHYSICIAN   Blood gas, arterial      Status: Abnormal   Collection Time: 2017-04-18  5:50 AM  Result Value Ref Range   O2 Content 4.0 L/min   Delivery systems NASAL CANNULA    LHR 18 resp/min   pH, Arterial 7.156 (LL) 7.350 - 7.450    Comment: CRITICAL RESULT CALLED TO, READ BACK BY AND VERIFIED WITH: DR. DONALD WICKLINE AT 0554 ON Apr 18, 2017 BY PATRICK SWEENEY RRT, RCP    pCO2 arterial 56.2 (H) 32.0 - 48.0 mmHg   pO2, Arterial 114 (H) 83.0 -  108.0 mmHg   Bicarbonate 19.2 (L) 20.0 - 28.0 mmol/L   Acid-base deficit 9.7 (H) 0.0 - 2.0 mmol/L   O2 Saturation 96.3 %   Patient temperature 97.4    Collection site LEFT BRACHIAL    Drawn by [id number]    Sample type ARTERIAL    Allens test (pass/fail) PASS PASS    Comment: Performed at [facility], [place] [address], [place], [place] [address]  Procalcitonin     Status: None   Collection Time: 04-18-2017  8:45 AM  Result Value Ref Range   Procalcitonin 1.34 ng/mL    Comment:        Interpretation: PCT > 0.5 ng/mL and <= 2 ng/mL: Systemic infection (sepsis) is possible, but other conditions are known to elevate PCT as well. (NOTE)       Sepsis PCT Algorithm           Lower Respiratory Tract                                      Infection PCT Algorithm    ----------------------------     ----------------------------         PCT < 0.25 ng/mL                PCT < 0.10 ng/mL         Strongly encourage             Strongly discourage   discontinuation of antibiotics    initiation of antibiotics    ----------------------------     -----------------------------       PCT 0.25 - 0.50 ng/mL            PCT 0.10 - 0.25 ng/mL               OR       >80% decrease in PCT            Discourage initiation of                                            antibiotics      Encourage discontinuation           of antibiotics    ----------------------------     -----------------------------         PCT >= 0.50 ng/mL              PCT 0.26 - 0.50 ng/mL                AND       <80%  decrease in PCT             Encourage initiation of                                             antibiotics       Encourage continuation           of antibiotics    ----------------------------     -----------------------------        PCT >= 0.50 ng/mL                  PCT > 0.50 ng/mL  AND         increase in PCT                  Strongly encourage                                      initiation of antibiotics    Strongly encourage escalation           of antibiotics                                     -----------------------------                                           PCT <= 0.25 ng/mL                                                 OR                                        > 80% decrease in PCT                                     Discontinue / Do not initiate                                             antibiotics Performed at [facility] [address] [place], [place] [address]   Troponin I     Status: Abnormal   Collection Time: 04-23-17  8:45 AM  Result Value Ref Range   Troponin I 0.26 (HH) <0.03 ng/mL    Comment: CRITICAL RESULT CALLED TO, READ BACK BY AND VERIFIED WITH: [facility]. RN AT 1023 April 23, 2017 MULLINS,T Performed at [facility], [place] [address], [place], [place] [address]   I-stat chem 8, ed     Status: Abnormal   Collection Time: 04/23/17  8:51 AM  Result Value Ref Range   Sodium 134 (L) 135 - 145 mmol/L   Potassium 6.7 (HH) 3.5 - 5.1 mmol/L   Chloride 102 101 - 111 mmol/L   BUN >140 (H) 6 - 20 mg/dL   Creatinine, Ser 6.70 (H) 0.61 - 1.24 mg/dL   Glucose, Bld 150 (H) 65 - 99 mg/dL   Calcium, Ion 1.04 (L) 1.15 - 1.40 mmol/L   TCO2 25 22 - 32 mmol/L   Hemoglobin 12.2 (L) 13.0 - 17.0 g/dL   HCT 36.0 (L) 39.0 - 52.0 %   Comment NOTIFIED PHYSICIAN   Lactic acid, plasma     Status: None   Collection Time: 04/23/17  9:20 AM  Result Value Ref Range   Lactic Acid, Venous 1.1 0.5 - 1.9 mmol/L    Comment:  Performed at [facility], [place] [address] [place], [place]  [address]  Ammonia     Status: Abnormal   Collection Time: May 16, 2017  9:20 AM  Result Value Ref Range   Ammonia 48 (H) 9 - 35 umol/L    Comment: Performed at [facility], [place] [address] [place], [place] [address]  Potassium     Status: Abnormal   Collection Time: May 16, 2017  9:20 AM  Result Value Ref Range   Potassium 6.5 (HH) 3.5 - 5.1 mmol/L    Comment: RESULTS VERIFIED VIA RECOLLECT NO VISIBLE HEMOLYSIS CRITICAL RESULT CALLED TO, READ BACK BY AND VERIFIED WITH: Paralee Cancel RN [facility] AT 1019 05-16-17 MULLINS,T Performed at [facility], [place] [address] [place], [place] [address]   Urinalysis, Routine w reflex microscopic     Status: Abnormal   Collection Time: May 16, 2017  9:41 AM  Result Value Ref Range   Color, Urine AMBER (A) YELLOW    Comment: BIOCHEMICALS MAY BE AFFECTED BY COLOR   APPearance CLOUDY (A) CLEAR   Specific Gravity, Urine 1.016 1.005 - 1.030   pH 5.0 5.0 - 8.0   Glucose, UA 50 (A) NEGATIVE mg/dL   Hgb urine dipstick MODERATE (A) NEGATIVE   Bilirubin Urine NEGATIVE NEGATIVE   Ketones, ur 5 (A) NEGATIVE mg/dL   Protein, ur 100 (A) NEGATIVE mg/dL   Nitrite NEGATIVE NEGATIVE   Leukocytes, UA NEGATIVE NEGATIVE   RBC / HPF 6-30 0 - 5 RBC/hpf   WBC, UA 0-5 0 - 5 WBC/hpf   Bacteria, UA RARE (A) NONE SEEN   Squamous Epithelial / LPF NONE SEEN NONE SEEN   Mucus PRESENT    Hyaline Casts, UA PRESENT     Comment: Performed at [facility], [place] [address], [place], [place] [address]  POC CBG, ED     Status: Abnormal   Collection Time: 05-16-17 11:01 AM  Result Value Ref Range   Glucose-Capillary 151 (H) 65 - 99 mg/dL  Glucose, capillary     Status: Abnormal   Collection Time: 2017/05/16 11:49 AM  Result Value Ref Range   Glucose-Capillary 211 (H) 65 - 99 mg/dL   Comment 1 Capillary Specimen     Ct Abdomen Pelvis Wo Contrast  Result  Date: May 16, 2017 CLINICAL DATA:  67 year old male with abdominal distention and pain. EXAM: CT ABDOMEN AND PELVIS WITHOUT CONTRAST TECHNIQUE: Multidetector CT imaging of the abdomen and pelvis was performed following the standard protocol without IV contrast. COMPARISON:  Renal ultrasound dated 05/20/2012 FINDINGS: Evaluation of this exam is limited in the absence of intravenous contrast. Lower chest: The visualized lung bases are clear. There is hypoattenuation of the cardiac blood pool suggestive of a degree of anemia. Clinical correlation is recommended. Partially visualized pacemaker wires. No intra-abdominal free air.  Small ascites. Hepatobiliary: There is slight irregularity of the liver contour which may represent early changes of cirrhosis. Correlation with clinical exam and liver function tests recommended. No intrahepatic biliary ductal dilatation. High attenuating content within the gallbladder may represent sludge or stones. There is mild thickened and hazy appearance of the gallbladder wall which may be partly related to underdistention. Acute cholecystitis is not excluded. Further evaluation with ultrasound is recommended. Pancreas: There is minimal haziness of the peripancreatic fat. Correlation with pancreatic enzymes recommended to exclude pancreatitis. Punctate calcific density in the uncinate process of the pancreas likely sequela of chronic inflammation. Spleen: Normal in size without focal abnormality. Adrenals/Urinary Tract: The adrenal glands are unremarkable. Mild bilateral renal parenchyma atrophy. There is no hydronephrosis or nephrolithiasis on either  side. The visualized ureters and urinary bladder appear unremarkable. Stomach/Bowel: There is extensive sigmoid diverticulosis with muscular hypertrophy. No definite active inflammatory changes. Scattered colonic diverticula noted. There is no bowel obstruction or active inflammation. Normal appendix. Vascular/Lymphatic: Advanced aortoiliac  atherosclerotic disease. The IVC appears unremarkable on this noncontrast CT. No portal venous gas. There is no adenopathy. Reproductive: The prostate and seminal vesicles are grossly unremarkable. Penile implant with reservoir in the right hemipelvis. Other: Mild diffuse subcutaneous edema. Musculoskeletal: Degenerative changes of the spine. Moderate right hip arthritic changes. No acute osseous pathology. IMPRESSION: 1. Probable gallstone. Further evaluation with ultrasound recommended to exclude acute cholecystitis. 2. Mild peripancreatic haziness possibly related to ascites. Correlation with pancreatic enzymes recommended to exclude pancreatitis. 3. Extensive colonic diverticulosis. No bowel obstruction or active inflammation. Normal appendix. 4. Small ascites. 5. Slight irregularity of the liver contour suspicious for early changes of cirrhosis. Clinical correlation is recommended. 6. Advanced Aortic Atherosclerosis (ICD10-I70.0). Electronically Signed   By: Anner Crete M.D.   On: 04/30/2017 06:01   Ct Head Wo Contrast  Result Date: Apr 30, 2017 CLINICAL DATA:  67 year old male with altered level of consciousness. Dizziness for the past 24 hours. EXAM: CT HEAD WITHOUT CONTRAST TECHNIQUE: Contiguous axial images were obtained from the base of the skull through the vertex without intravenous contrast. COMPARISON:  None. FINDINGS: Brain: Patchy areas of decreased attenuation are noted throughout the deep and periventricular white matter of the cerebral hemispheres bilaterally, compatible with mild chronic microvascular ischemic disease. No evidence of acute infarction, hemorrhage, hydrocephalus, extra-axial collection or mass lesion/mass effect. Vascular: No hyperdense vessel or unexpected calcification. Skull: Normal. Negative for fracture or focal lesion. Sinuses/Orbits: No acute finding. Other: None. IMPRESSION: 1. No significant incidental noncardiac findings are noted. 2. Mild chronic microvascular  ischemic changes in the cerebral white matter, as above. Electronically Signed   By: Vinnie Langton M.D.   On: 30-Apr-2017 09:31   Dg Chest Portable 1 View  Result Date: 04-30-17 CLINICAL DATA:  Central line placement. EXAM: PORTABLE CHEST 1 VIEW 10:51 a.m. COMPARISON:  30-Apr-2017 at 3:35 a.m. FINDINGS: Left IJ catheter has been inserted. The tip appears in good position in the superior vena cava region at the level of the azygos vein. No pneumothorax. Heart size and pulmonary vascularity are normal. CABG. AICD in place. Lungs are clear. No acute bone abnormality. Aortic atherosclerosis. IMPRESSION: New central line appears in good position. No acute abnormalities. Aortic Atherosclerosis (ICD10-I70.0). Electronically Signed   By: Lorriane Shire M.D.   On: 04-30-2017 11:11   Dg Chest Port 1 View  Result Date: 04-30-2017 CLINICAL DATA:  67 year old male with shortness of breath. EXAM: PORTABLE CHEST 1 VIEW COMPARISON:  Chest radiograph dated 05/23/2012 FINDINGS: The lungs are clear. There is no pleural effusion or pneumothorax. Borderline cardiomegaly. Median sternotomy wires and left pectoral pacemaker device. No acute osseous pathology. IMPRESSION: No acute cardiopulmonary process. Electronically Signed   By: Anner Crete M.D.   On: 30-Apr-2017 04:13   US Abdomen Limited Ruq  Result Date: Apr 30, 2017 CLINICAL DATA:  Abnormal gallbladder on CT earlier today. EXAM: ULTRASOUND ABDOMEN LIMITED RIGHT UPPER QUADRANT COMPARISON:  CT abdomen pelvis Apr 30, 2017. FINDINGS: Gallbladder: Layering echogenic sludge and stones. Gallbladder wall measures 8 mm. No pericholecystic fluid. Negative sonographic Murphy sign. Common bile duct: Diameter: 3 mm, within normal limits. Liver: Diffusely increased in echogenicity. Small perihepatic fluid. Portal vein is patent on color Doppler imaging with normal direction of blood flow towards the liver. IMPRESSION: 1. Gallbladder wall thickening with  sludge and gallstones.  Findings can be seen with chronic cholecystitis. 2. Liver appears steatotic. 3. Small perihepatic ascites. Electronically Signed   By: Lorin Picket M.D.   On: 05-01-2017 10:14    ROS Blood pressure 99/65, pulse 68, temperature (!) 97.3 F (36.3 C), temperature source Oral, resp. rate 15, SpO2 96 %. Physical Exam Physical Examination: General appearance - obese, pale acutely ill, plethoric face Mental status - alert, oriented to person, place, and time, mentation is slow and holes in hx Eyes - DM retinal dz Mouth - mucous membranes moist, pharynx normal without lesions Neck - adenopathy noted PCL Lymphatics - posterior cervical nodes Chest - scattered rhonchi, decreased bs Heart - irreg, Gr r 2/6 M,  Pacer L [place] Abdomen - distended, decreased bs,  Liver down 6 cm,   GU Male - massive bruising of penis, scrotum and swelling.  Extremities - pedal edema 1-2 +, cool , trophic changes,  Skin - bruises, trophic changes on feet,   Assessment/Plan: 1 AKI low bp , poor intake, in setting of ARB,  NSAID.  Chronic ^ K and now worse, .Baseline CKD 4.  And not sure why on NSAIDs. .  Vol xs, acidemia mild, severe uremia and ^^K.  2 CKD 4 DM and HTN 3 Hypertension: not an issue 4. Anemia not an issue 5. Metabolic Bone Disease: need to check 6 DM  7 PVD 8 CAD 9 CM P CRRT, 0K, resuscitate with vol ,U/S to make sure cath in bladder.    Jeneen Rinks Jillianna Stanek 01-May-2017, 12:02 PM

## 2017-04-19 NOTE — Progress Notes (Signed)
Patient refused ABG; Dr. Vassie Loll PCCM MD made aware.

## 2017-04-19 NOTE — Progress Notes (Signed)
PHARMACY NOTE:  ANTIMICROBIAL RENAL DOSAGE ADJUSTMENT  Current antimicrobial regimen includes a mismatch between antimicrobial dosage and estimated renal function.  As per policy approved by the Pharmacy & Therapeutics and Medical Executive Committees, the antimicrobial dosage will be adjusted accordingly.  Current antimicrobial dosage:  Zosyn 2.25 gm every 6 hours  Indication: Intra-abdominal infection  Renal Function:  Estimated Creatinine Clearance: 11.9 mL/min (A) (by C-G formula based on SCr of 6.7 mg/dL (H)). []      On intermittent HD, scheduled: [x]      On CRRT    Antimicrobial dosage has been changed to: Zosyn 3.375 gm every 6 hours over 30 minutes  Additional comments:   Thank you for allowing pharmacy to be a part of this patient's care.  Sharin Mons, PharmD, BCPS PGY2 Infectious Diseases Pharmacy Resident Pager: [phone]  2017-04-21 12:07 PM

## 2017-04-19 NOTE — H&P (Addendum)
HISTORY & PHYSICAL  Patient Name: Maxwell Fitzgerald MRN: [id number] DOB: 10/24/1950    ADMISSION DATE:  Apr 23, 2017 DATE OF SERVICE:  04/23/2017  REFERRING MD: Dr. Zadie Rhine  CHIEF COMPLAINT: Weakness, altered mental status (word searching)  HISTORY OF PRESENT ILLNESS  This 67 y.o. obese Caucasian male reformed smoker presented to the [facility] Emergency Department via EMS with complaints of generalized weakness, left-sided abdominal pain, slurred speech, word searching, poor oral intake.  The patient is awake, alert and oriented to time person and place.  However, the patient struggles with word searching.  Consequently, clinical history is obtained from the patient's fianc who is at the bedside.  Patient underwent surgery for placement of a penile implant on Friday (4 days ago).  The patient's fianc notes that he has had poor oral intake since surgery.  Also, she has been concerned with what she perceived to be slurred speech and confusion.  Most notably, the patient has been struggling with word searching.  In the emergency department, the patient was found to have hyperkalemia (7.3).  Also, he has been noted to be in acute on chronic renal failure.  Based on discussion with the nursing staff, the patient apparently had a brief episode where he appeared to be apneic.  He was supported with bag mask ventilation and promptly returned to his presenting baseline mental status.  Consequently, the patient was not intubated.  He never lost pulse or pressure.  He did receive temporizing measure treatment measures for hyperkalemia (insulin, D50, calcium gluconate, 1 amp of sodium bicarbonate).  REVIEW OF SYSTEMS  Constitutional: Anorexia.  No weight loss. No night sweats. No fever. No chills. No fatigue. HEENT: No headaches, dysphagia, sore throat, otalgia, nasal congestion, PND CV:  No chest pain, orthopnea, PND, swelling in lower extremities, palpitations GI: Left-sided abdominal pain.   Diarrhea.  No nausea, vomiting. Resp: No DOE, rest dyspnea, cough, mucus, hemoptysis, wheezing  GU: no dysuria, change in color of urine, no urgency or frequency.  No flank pain. MS:  No joint pain or swelling. No myalgias,  No decreased range of motion.  Psych: Word searching.  No memory loss.  No change in mood or affect. Skin: no rash or lesions.   PAST MEDICAL/SURGICAL/SOCIAL/FAMILY HISTORIES   Past Medical History:  Diagnosis Date  . AICD (automatic cardioverter/defibrillator) present   . Anxiety   . Atrial fibrillation, permanent (HCC)    on warfarin; recurred after DCCV  . Biventricular ICD (implantable cardioverter-defibrillator) in place    implanted by Dr. Ladona Ridgel November 2014  . Cardiomyopathy, EF 30-35% by echo 03/2012 05/19/2012   Decreased from 2009 Echo, 40-45%  . CHF (congestive heart failure) (HCC)   . Chronic anticoagulation    on coumadin  . CKD (chronic kidney disease) stage 3, GFR 30-59 ml/min (HCC)    Baseline BUN/Cr 09/2011 - 36/1.44  . Cold    with cough clear mucous since 04-07-17  . Coronary artery disease    s/p CABG 1998; Myoview 2008 mild septal ischemia -no cath,  . Hypertension   . LBBB (left bundle branch block)    chronic  . PAD (peripheral artery disease) (HCC) 1997   Bilateral SFA PTA in 1997  . Peripheral neuropathy    4 toes left foot great and small toe, right foot great and small toe  . Renal disorder   . Type 2 diabetes mellitus (HCC)    type 2 x 28  yrs    Past Surgical History:  Procedure Laterality Date  . BI-VENTRICULAR IMPLANTABLE CARDIOVERTER DEFIBRILLATOR N/A 01/08/2013   Procedure: BI-VENTRICULAR IMPLANTABLE CARDIOVERTER DEFIBRILLATOR  (CRT-D);  Surgeon: Marinus Maw, MD;  Location: [facility] CATH LAB;  Service: Cardiovascular;  Laterality: N/A;  . BI-VENTRICULAR IMPLANTABLE CARDIOVERTER DEFIBRILLATOR  (CRT-D)  01-08-2013   STJ CRTD implanted by Dr Ladona Ridgel for ischemic cardiomyopathy, CHF, and LBBB  . CORONARY ARTERY BYPASS GRAFT   03/02/1996   x7, LIMA-LAD, SVG-OM1, SVG-OM2, SVG-OM3, SVG-PDA, SVG-PLA  . KNEE ARTHROSCOPY Right 03/07/2015   Procedure: ARTHROSCOPY RIGHT KNEE WITH DEBRIDEMENT;  Surgeon: Durene Romans, MD;  Location: [facility] ORS;  Service: Orthopedics;  Laterality: Right;  . PENILE PROSTHESIS IMPLANT N/A 04/12/2017   Procedure: PENILE PROTHESIS INFLATABLE;  Surgeon: Ihor Gully, MD;  Location: [facility] ORS;  Service: Urology;  Laterality: N/A;  . PERIPHERAL VASCULAR INTERVENTION  06/1996   Bilateral SFA PTA  . PERIPHERAL VASCULAR INTERVENTION  06/30/1996   R SFA-high grade 95% focal stenosis crossed w/ a 5x46mm Cordis "Opti-5" balloon dilated at 5atm-56sec, 7atm-90sec, and 5atm-120sec    Social History   Tobacco Use  . Smoking status: Former Smoker    Packs/day: 1.00    Years: 30.00    Pack years: 30.00    Last attempt to quit: 05/20/1995    Years since quitting: 21.9  . Smokeless tobacco: Never Used  Substance Use Topics  . Alcohol use: Yes    Comment: 2 days per week 2 drinks    Family History  Problem Relation Age of Onset  . CAD Mother   . Diabetes Mother   . CAD Father   . Diabetes Father   . CAD Brother      Allergies  Allergen Reactions  . Potassium Chloride Other (See Comments)    Kidney failure & heart EF issues - in large doses  . Potassium-Containing Compounds Other (See Comments)    Kidney failure: Specifically Potassium Chloride - in large doses  . Metformin And Related Other (See Comments)    Bothered liver      Prior to Admission medications   Medication Sig Start Date End Date Taking? Authorizing Provider  allopurinol (ZYLOPRIM) 100 MG tablet Take 100 mg by mouth 2 (two) times daily.    Yes [provider]  amoxicillin-clavulanate (AUGMENTIN) 875-125 MG tablet Take 1 tablet by mouth 2 (two) times daily for 10 days. 04/12/17 04/22/17 Yes Ihor Gully, MD  atorvastatin (LIPITOR) 20 MG tablet Take 1 tablet (20 mg total) by mouth daily at 6 PM. Patient taking differently:  Take 20 mg by mouth daily.  05/21/12  Yes Elease Etienne, MD  calcitRIOL (ROCALTROL) 0.25 MCG capsule Take 0.25 mcg by mouth every Monday, Wednesday, and Friday.  07/10/13  Yes [provider]  clonazePAM (KLONOPIN) 1 MG tablet Take 1 mg by mouth at bedtime.   Yes [provider]  colchicine 0.6 MG tablet Take 0.6 mg by mouth daily as needed (for flare ups).    Yes [provider]  diltiazem (CARDIZEM) 60 MG tablet Take 60 mg by mouth 2 (two) times daily.   Yes [provider]  furosemide (LASIX) 40 MG tablet Take 40 mg by mouth 2 (two) times daily.   Yes [provider]  glimepiride (AMARYL) 2 MG tablet Take 2-4 mg by mouth See admin instructions. Take 2 mg by mouth in the morning and take 4 mg by mouth at bedtime   Yes [provider]  guaiFENesin (MUCINEX) 600 MG 12 hr tablet Take 1,200 mg by  mouth 2 (two) times daily as needed for cough.   Yes [provider]  Insulin Glargine (BASAGLAR KWIKPEN) 100 UNIT/ML SOPN Inject 16-18 Units into the skin See admin instructions. Inject 18 units SQ in the morning and inject 16 units SQ in the evening   Yes [provider]  losartan (COZAAR) 50 MG tablet Take 50 mg by mouth every evening.  01/09/13  Yes Marinus Maw, MD  metoprolol (LOPRESSOR) 50 MG tablet Take 1 tablet (50 mg total) by mouth 2 (two) times daily. 04/09/14  Yes Marinus Maw, MD  metoprolol tartrate (LOPRESSOR) 25 MG tablet TAKE 3 TABLETS (75 MG TOTAL) BY MOUTH 2 (TWO) TIMES DAILY. Patient taking differently: Take 25 mg by mouth twice daily. Take with 50 mg to = 75 mg 09/06/15  Yes Runell Gess, MD  naproxen sodium (ALEVE) 220 MG tablet Take 220 mg by mouth daily as needed (for pain or headache).   Yes [provider]  Oxycodone HCl 10 MG TABS Take 1 tablet (10 mg total) by mouth every 4 (four) hours as needed. Patient taking differently: Take 10 mg by mouth every 4 (four) hours as needed (pain).  04/12/17   Yes Ihor Gully, MD  Phenylephrine-Acetaminophen (VICKS DAYQUIL SINUS PO) Take 30 mLs by mouth every 8 (eight) hours as needed. Cough   Yes [provider]  sodium polystyrene (KAYEXALATE) 15 GM/60ML suspension Take 15 g by mouth every Wednesday.    Yes [provider]  triamcinolone cream (KENALOG) 0.1 % Apply 1 application topically every other day.   Yes [provider]  warfarin (COUMADIN) 5 MG tablet TAKE ONE TABLET BY MOUTH DAILY OR AS DIRECTED BY COUMADIN CLINIC Patient taking differently: Take 7.5 mg by mouth daily on Sunday and Thursday. Take 5 mg by mouth daily on all other days 12/17/16  Yes Runell Gess, MD  Liniments [facility] EX) Apply 1 patch topically daily as needed (for pain).    [provider]    Current Facility-Administered Medications  Medication Dose Route Frequency Provider Last Rate Last Dose  . albuterol (PROVENTIL, VENTOLIN) (5 MG/ML) 0.5% continuous inhalation solution           . piperacillin-tazobactam (ZOSYN) IVPB 2.25 g  2.25 g Intravenous Q6H Zadie Rhine, MD      . sodium chloride 0.9 % bolus 1,000 mL  1,000 mL Intravenous Once Zadie Rhine, MD       Current Outpatient Medications  Medication Sig Dispense Refill  . allopurinol (ZYLOPRIM) 100 MG tablet Take 100 mg by mouth 2 (two) times daily.     Marland Kitchen amoxicillin-clavulanate (AUGMENTIN) 875-125 MG tablet Take 1 tablet by mouth 2 (two) times daily for 10 days. 14 tablet 0  . atorvastatin (LIPITOR) 20 MG tablet Take 1 tablet (20 mg total) by mouth daily at 6 PM. (Patient taking differently: Take 20 mg by mouth daily. ) 30 tablet 0  . calcitRIOL (ROCALTROL) 0.25 MCG capsule Take 0.25 mcg by mouth every Monday, Wednesday, and Friday.     . clonazePAM (KLONOPIN) 1 MG tablet Take 1 mg by mouth at bedtime.    . colchicine 0.6 MG tablet Take 0.6 mg by mouth daily as needed (for flare ups).     . diltiazem (CARDIZEM) 60 MG tablet Take 60 mg by mouth 2 (two) times daily.     . furosemide (LASIX) 40 MG tablet Take 40 mg by mouth 2 (two) times daily.    Marland Kitchen glimepiride (AMARYL) 2 MG  tablet Take 2-4 mg by mouth See admin instructions. Take 2 mg by mouth in the morning and take 4 mg by mouth at bedtime    . guaiFENesin (MUCINEX) 600 MG 12 hr tablet Take 1,200 mg by mouth 2 (two) times daily as needed for cough.    . Insulin Glargine (BASAGLAR KWIKPEN) 100 UNIT/ML SOPN Inject 16-18 Units into the skin See admin instructions. Inject 18 units SQ in the morning and inject 16 units SQ in the evening    . losartan (COZAAR) 50 MG tablet Take 50 mg by mouth every evening.     . metoprolol (LOPRESSOR) 50 MG tablet Take 1 tablet (50 mg total) by mouth 2 (two) times daily. 180 tablet 3  . metoprolol tartrate (LOPRESSOR) 25 MG tablet TAKE 3 TABLETS (75 MG TOTAL) BY MOUTH 2 (TWO) TIMES DAILY. (Patient taking differently: Take 25 mg by mouth twice daily. Take with 50 mg to = 75 mg) 180 tablet 5  . naproxen sodium (ALEVE) 220 MG tablet Take 220 mg by mouth daily as needed (for pain or headache).    . Oxycodone HCl 10 MG TABS Take 1 tablet (10 mg total) by mouth every 4 (four) hours as needed. (Patient taking differently: Take 10 mg by mouth every 4 (four) hours as needed (pain). ) 20 tablet 0  . Phenylephrine-Acetaminophen (VICKS DAYQUIL SINUS PO) Take 30 mLs by mouth every 8 (eight) hours as needed. Cough    . sodium polystyrene (KAYEXALATE) 15 GM/60ML suspension Take 15 g by mouth every Wednesday.     . triamcinolone cream (KENALOG) 0.1 % Apply 1 application topically every other day.    . warfarin (COUMADIN) 5 MG tablet TAKE ONE TABLET BY MOUTH DAILY OR AS DIRECTED BY COUMADIN CLINIC (Patient taking differently: Take 7.5 mg by mouth daily on Sunday and Thursday. Take 5 mg by mouth daily on all other days) 90 tablet 0  . Liniments (SALONPAS EX) Apply 1 patch topically daily as needed (for pain).       VITAL SIGNS: BP 111/63 (BP Location: Left Arm)   Pulse 72   Temp (!) 97.4 F (36.3  C) (Oral)   Resp 13   SpO2 94%   HEMODYNAMICS:    VENTILATOR SETTINGS:    INTAKE / OUTPUT: I/O last 3 completed shifts: In: 1000 [IV Piggyback:1000] Out: -   PHYSICAL EXAMINATION: GENERAL: alert, oriented to person, place, and time. Pleasant. Well-developed. Cooperative. No acute distress. HEAD: normocephalic, atraumatic EYE: PERRLA, EOM intact, no scleral icterus, no pallor. NOSE: nares are patent. No exudate. THROAT/ORAL CAVITY: Normal dentition. No oral thrush. No exudate. Mucous membranes are moist. No tonsillar enlargement. Mallampati class III (soft and hard palate and base of uvula visible) airway. NECK: supple, no thyromegaly, no JVD, no lymphadenopathy. Trachea midline. CHEST/LUNG: symmetric in development and expansion. Good air entry.  No crackles.  Bilateral rhonchi. HEART: Regular S1 and S2 without murmur, rub or gallop.  Frequent ectopy. ABDOMEN: Soft, distended. Normoactive bowel sounds. No rebound. No guarding. No hepatosplenomegaly. EXTREMITIES: Edema: 1+ and pitting. No cyanosis.  No clubbing. 2+ DP pulses LYMPHATIC: no cervical/axiallary/inguinal lymph nodes appreciated MUSCULOSKELETAL: no joint tenderness, deformity or swelling. SKIN: Pelvic/perineal bruising secondary to recent surgery. NEUROLOGIC: No lateralizing signs.  Cranial nerves II-XII are grossly symmetric and physiologic. Babinski absent bilaterally. No sensory deficit. Motor: 5/5 @ RUE, 5/5 @ LUE, 5/5 @ RLL,  5/5 @ LLL.  DTR: 2+ @ R biceps, 2+ @ L biceps, 2+ @ R patellar,  2+ @  L patellar. No cerebellar signs. Gait was not assessed.   LABS:  BASIC METABOLIC PROFILE Recent Labs  Lab 04/12/17 1013 2017-04-26 0352 04/26/2017 0413  NA 141 134* 131*  K 5.4* 7.3* 7.1*  CL 101 96* 98*  CO2 27 22  --   BUN 117* 189* >140*  CREATININE 3.33* 7.13* 6.70*  GLUCOSE 144* 203* 185*  CALCIUM 8.4* 8.1*  --     Glucose Recent Labs  Lab 04/12/17 2051 04/13/17 0009 04/13/17 0419 04/13/17 0735  04/13/17 1133 2017/04/26 0404  GLUCAP 240* 207* 191* 156* 190* 186*    Liver Enzymes Recent Labs  Lab April 26, 2017 0352  AST 864*  ALT 493*  ALKPHOS 90  BILITOT 1.2  ALBUMIN 3.5    CBC Recent Labs  Lab 04/12/17 0658 April 26, 2017 0352 04/26/17 0413  WBC  --  19.1*  --   HGB 12.2* 12.7* 13.9  HCT 36.0* 40.1 41.0  PLT  --  216  --     COAGULATION STUDIES Recent Labs  Lab 04/12/17 1013 2017-04-26 0352  INR 1.46 1.91    SEPSIS MARKERS Recent Labs  Lab Apr 26, 2017 0413  LATICACIDVEN 2.76*    ABG Recent Labs  Lab 04-26-17 0550  PHART 7.156*  PCO2ART 56.2*  PO2ART 114*    Cardiac Enzymes No results for input(s): TROPONINI, PROBNP in the last 168 hours.  Imaging Ct Abdomen Pelvis Wo Contrast  Result Date: 04/26/2017 CLINICAL DATA:  67 year old male with abdominal distention and pain. EXAM: CT ABDOMEN AND PELVIS WITHOUT CONTRAST TECHNIQUE: Multidetector CT imaging of the abdomen and pelvis was performed following the standard protocol without IV contrast. COMPARISON:  Renal ultrasound dated 05/20/2012 FINDINGS: Evaluation of this exam is limited in the absence of intravenous contrast. Lower chest: The visualized lung bases are clear. There is hypoattenuation of the cardiac blood pool suggestive of a degree of anemia. Clinical correlation is recommended. Partially visualized pacemaker wires. No intra-abdominal free air.  Small ascites. Hepatobiliary: There is slight irregularity of the liver contour which may represent early changes of cirrhosis. Correlation with clinical exam and liver function tests recommended. No intrahepatic biliary ductal dilatation. High attenuating content within the gallbladder may represent sludge or stones. There is mild thickened and hazy appearance of the gallbladder wall which may be partly related to underdistention. Acute cholecystitis is not excluded. Further evaluation with ultrasound is recommended. Pancreas: There is minimal haziness of the  peripancreatic fat. Correlation with pancreatic enzymes recommended to exclude pancreatitis. Punctate calcific density in the uncinate process of the pancreas likely sequela of chronic inflammation. Spleen: Normal in size without focal abnormality. Adrenals/Urinary Tract: The adrenal glands are unremarkable. Mild bilateral renal parenchyma atrophy. There is no hydronephrosis or nephrolithiasis on either side. The visualized ureters and urinary bladder appear unremarkable. Stomach/Bowel: There is extensive sigmoid diverticulosis with muscular hypertrophy. No definite active inflammatory changes. Scattered colonic diverticula noted. There is no bowel obstruction or active inflammation. Normal appendix. Vascular/Lymphatic: Advanced aortoiliac atherosclerotic disease. The IVC appears unremarkable on this noncontrast CT. No portal venous gas. There is no adenopathy. Reproductive: The prostate and seminal vesicles are grossly unremarkable. Penile implant with reservoir in the right hemipelvis. Other: Mild diffuse subcutaneous edema. Musculoskeletal: Degenerative changes of the spine. Moderate right hip arthritic changes. No acute osseous pathology. IMPRESSION: 1. Probable gallstone. Further evaluation with ultrasound recommended to exclude acute cholecystitis. 2. Mild peripancreatic haziness possibly related to ascites. Correlation with pancreatic enzymes recommended to exclude pancreatitis. 3. Extensive colonic diverticulosis. No bowel obstruction or active inflammation. Normal  appendix. 4. Small ascites. 5. Slight irregularity of the liver contour suspicious for early changes of cirrhosis. Clinical correlation is recommended. 6. Advanced Aortic Atherosclerosis (ICD10-I70.0). Electronically Signed   By: Elgie Collard M.D.   On: 2017-05-11 06:01   Dg Chest Port 1 View  Result Date: 05/11/2017 CLINICAL DATA:  67 year old male with shortness of breath. EXAM: PORTABLE CHEST 1 VIEW COMPARISON:  Chest radiograph dated  05/23/2012 FINDINGS: The lungs are clear. There is no pleural effusion or pneumothorax. Borderline cardiomegaly. Median sternotomy wires and left pectoral pacemaker device. No acute osseous pathology. IMPRESSION: No acute cardiopulmonary process. Electronically Signed   By: Elgie Collard M.D.   On: May 11, 2017 04:13     STUDIES:  -  CULTURES: Results for orders placed or performed during the hospital encounter of 01/08/13  Surgical pcr screen     Status: None   Collection Time: 01/08/13  6:42 AM  Result Value Ref Range Status   MRSA, PCR NEGATIVE NEGATIVE Final   Staphylococcus aureus NEGATIVE NEGATIVE Final    Comment:        The Xpert SA Assay (FDA approved for NASAL specimens in patients over 81 years of age), is one component of a comprehensive surveillance program.  Test performance has been validated by [employer] for patients greater than or equal to 38 year old. It is not intended to diagnose infection nor to guide or monitor treatment.    ANTIBIOTICS: -  SIGNIFICANT EVENTS: 2/27: Per nursing report, brief respiratory arrest, successfully resuscitated with bag mask ventilation.  Not intubated.  No loss of pulse or pressure.  LINES/TUBES: -   ASSESSMENT / PLAN: Principal Problem:   Acute kidney injury superimposed on chronic kidney disease (HCC) Active Problems:   Hyperkalemia   High anion gap metabolic acidosis   Uremic encephalopathy   Chronic combined systolic and diastolic heart failure, NYHA class 3 (HCC)   Type 2 diabetes mellitus with renal complication (HCC)   Cardiomyopathy, EF 30-35% by echo 03/2012   Nonsustained ventricular tachycardia (HCC)   Speech abnormality   Abnormal LFTs   Hypertension   Gallstones  In light of anticipated need for dialysis support, this patient needs to be admitted to [facility]. Admit to ICU under my service (Attending: Marcelle Smiling, MD) with the diagnoses highlighted above in the active  Hospital Problem List (ASSESSMENT).  By systems:  RENAL  Acute on chronic kidney disease  Anion gap metabolic acidosis, lactic and uremic  Hyperkalemia Consult nephrology reason: Acute on chronic kidney failure, malignant hyperkalemia.  I suspect he will not need dialysis support. Start bicarbonate gtt (D5W + 3 amps bicarb @ 50 cc/hr).  Repeat ABG in 1 hour.  CARDIOVASCULAR  Nonsustained ventricular tachycardia  Hypertension  Chronic atrial fibrillation, although the monitor appears to show a sinus rhythm at this time Check cardiac enzymes. 12-lead EKG. Check INR.  The patient takes Coumadin 7.5 mg q. Sunday, Thursday and 5 mg on the other days of the week. Review of his home medications suggests that his antihypertensive regimen is: Lopressor 75 mg p.o. twice daily Cozaar 50 mg p.o. daily, Lasix 40 mg p.o. twice daily, Cardizem 60 mg p.o. twice daily.  GASTROINTESTINAL  Gallstones  Abnormal LFTs Ultrasound right upper quadrant Check troponin  ENDOCRINE  Type 2 diabetes mellitus with renal manifestations Sliding scale insulin  NEUROLOGIC  Uremic encephalopathy  Abnormal speech/altered mental status CT head to rule out stroke. Check ammonia level.  INFECTIOUS  No acute issues at  this time. In light of his leukocytosis, gallstones and elevated lactate, continue Zosyn for now. Check procalcitonin. Trend lactate. Blood cultures x2. Respiratory culture  PULMONARY  No acute issues at this time Titrate supplemental oxygen to maintain SpO2 93+%.   NUTRITION: Diabetic/renal DVT PROPHYLAXIS: Warfarin GI PROPHYLAXIS: Protonix  FAMILY  - Updates: Fiance at the bedside  - Inter-disciplinary family meet or Palliative Care meeting due by: 04/24/2017   Marcelle Smiling, MD Board Certified by the ABIM, Pulmonary Diseases & Critical Care Medicine  [facility] Pager: [phone]  04-22-2017, 7:40 AM   Critical care time: 90 minutes. The  treatment and management of the patient's condition was required based on the threat of imminent deterioration. This time reflects time spent by the physician evaluating, providing care and managing the critically ill patient's care. The time was spent at the immediate bedside (or on the same floor/unit and dedicated to this patient's care). Time involved in separately billable procedures is NOT included int he critical care time indicated above. Family meeting and update time may be included above if and only if the patient is unable/incompetent to participate in clinical interview and/or decision making, and the discussion was necessary to determining treatment decisions.  Marcelle Smiling, MD

## 2017-04-19 NOTE — ED Provider Notes (Signed)
  Physical Exam  BP 99/65   Pulse 68   Temp (!) 97.4 F (36.3 C) (Oral)   Resp 15   SpO2 96%   Physical Exam  ED Course/Procedures     Procedures  MDM   67 year old male being admitted to the hospital for acute renal failure, and questionable cholecystitis. Dr. Bebe Shaggy primarily saw the patient and has consulted critical care, general surgery, urology and nephrology.  Nephrology had no further recommendations. Patient had received initial hyperkalemia antidote in the ED. potassium was greater than 7, with EKG changes.  CCM is admitting the patient, general surgery will be putting in a consult note.  Patient has already received Zosyn.  [facility] just notified me that patient went into a wide-complex tachycardic rhythm.  I confirmed the tachycardia on the telemetry monitor, and we will give bicarb and calcium gluconate.  Patient is on bicarb drip, I called pharmacy and they will be tubing down the bicarb drip as soon as possible. Repeat potassium ordered and is at 6.7. I spoke with Dr. Tyson Dense, critical care.  Patient is being transferred to [facility]  He will notified nephrology about seeing patient. He is going to reassess the patient, and likely send someone to the ED to put in a dialysis catheter.  On reassessment, patient is confused.  Repeat EKG shows no acute changes.  CRITICAL CARE Performed by: Mclane Arora   Total critical care time: 37 minutes  Critical care time was exclusive of separately billable procedures and treating other patients.  Critical care was necessary to treat or prevent imminent or life-threatening deterioration.  Critical care was time spent personally by me on the following activities: development of treatment plan with patient and/or surrogate as well as nursing, discussions with consultants, evaluation of patient's response to treatment, examination of patient, obtaining history from patient or surrogate, ordering and performing treatments  and interventions, ordering and review of laboratory studies, ordering and review of radiographic studies, pulse oximetry and re-evaluation of patient's condition.         Derwood Kaplan, MD 05/07/2017 [phone]

## 2017-04-19 NOTE — Code Documentation (Signed)
  Patient Name: Maxwell Fitzgerald   MRN: [id number]   Date of Birth/ Sex: Jul 03, 1950 , male      Admission Date: May 15, 2017  Attending Provider: Marcelle Smiling, MD  Primary Diagnosis: Gallstone [K80.20] Hyperkalemia [E87.5] Metabolic acidosis [E87.2] AKI (acute kidney injury) (HCC) [N17.9]   Indication: Pt was in his usual state of health until this PM, when he was noted to be unresponsive and in PEA arrest. Code blue was subsequently called. At the time of arrival on scene, ACLS protocol was underway.   Technical Description:  - CPR performance duration:  18 minutes  - Was defibrillation or cardioversion used? Yes  - Was external pacer placed? No  - Was patient intubated pre/post CPR? Yes   Medications Administered: Y = Yes; Blank = No Amiodarone    Atropine    Calcium  Y  Epinephrine  Y  Lidocaine    Magnesium    Norepinephrine    Phenylephrine    Sodium bicarbonate  Y  Vasopressin    Other    Post CPR evaluation:  - Final Status - Was patient successfully resuscitated ? No   Miscellaneous Information:  - Time of death:  6:54 PM  - Primary team notified?  Yes  - Family Notified? Yes     Scherrie Gerlach, MD   2017/05/15, 6:51 PM

## 2017-04-19 NOTE — Progress Notes (Signed)
   04-20-2017 1600  Clinical Encounter Type  Visited With Patient;Patient and family together  Visit Type Initial  Referral From Nurse  Consult/Referral To Chaplain  Spiritual Encounters  Spiritual Needs Emotional  Stress Factors  Patient Stress Factors Exhausted   This was a page for 67 year old caucasian male who underwent a medical emergency. One family member was on-site. One daughter lives in [place] and the nurse managed to get her over the phone. Girlfriend on-site and very tearful. I provided emotional support and compassionate presence.   Catilyn Boggus a Water quality scientist, [employer]

## 2017-04-19 NOTE — Procedures (Signed)
CPR Note:  PEA, bicarb, amio, epi, atropine, ca, D50 and insulin given.    NSR after on 50 mcg of norepi  See code sheet for details.  Alyson Reedy, M.D. [facility] Pulmonary/Critical Care Medicine. Pager: [phone]. After hours pager: [phone].

## 2017-04-19 NOTE — Progress Notes (Addendum)
PCCM Interim progress note:   Pt arrived to [facility] from [facility] earlier today for renal assistance and CVVHD.  Had episode of increased confusion, hypotension and subsequent PEA arrest with brief CPR (<5 mins) presumed r/t hyperkalemia/acidosis in setting acute on CKD +/- sepsis.  Responded to epi, Ca, Insulin, HCO3.  Post arrest with stable BP on levophed gtt.    PLAN -  Cmet, cbc, pt/inr, lactic acid, cortisol  Central line placed  Monitor CVP  Stat ABG, CXR  Vanc, zosyn  D/w urology, nephrology    Girlfriend updated throughout at bedside, daughter updated via phone   Dirk Dress, NP 05-11-17  5:00 PM Pager: [phone] or [phone]  Attending Note:  67 year old male that had a penile implant placed and the entire groin appears red and warm.  Patient became agonal and was not breathing.  Patient was intubated and developed PEA after intubation.  Arrested.  Subsequently in septic shock requiring levo.  Did not wake up and completely obtunded on exam.  TLC, a-line and ETT placed and all verified by CXR.    Spoke with daughter over the phone, Gaston from [place], [place].  After discussion, she informed us that patient has a living will and that he would not want further CPR but continue aggressive medical therapy otherwise.  Will change code status to LCB with no CPR.  Patient has an ICD.  When more stable will get a head CT  The patient is critically ill with multiple organ systems failure and requires high complexity decision making for assessment and support, frequent evaluation and titration of therapies, application of advanced monitoring technologies and extensive interpretation of multiple databases.   Critical Care Time devoted to patient care services described in this note is  100  Minutes. This time reflects time of care of this signee Dr Koren Bound. This critical care time does not reflect procedure time, or teaching time or supervisory time of PA/NP/Med  student/Med Resident etc but could involve care discussion time.  Alyson Reedy, M.D. [facility] Pulmonary/Critical Care Medicine. Pager: [phone]. After hours pager: [phone].

## 2017-04-19 NOTE — Progress Notes (Signed)
Upon my arrival,Pt just came to our unit. Pt was confused, did not follow commands. CRRT started, but within 30 min filter clotted. Fillter changed, but again, filter clotted. New order for Heparin -prime asked for from Nephrology. Before priming, Pt became more confused, stated Abdomen painy, beni.  Immidietly MD was called at the bedside. Pt was in a state of gasping of air. Emergently intubated. During the intubation, pt coded. Code blue was called. CPR started.  ACLS in process. Pt return to the rhythm. Emergently Central and arterial line was placed at the bedside. Levo gtt started emergently. Labs taken. Girlfriend at the bedside and updated.  Daughter called by Dr. Molli Knock. See new orders.

## 2017-04-19 NOTE — ED Notes (Signed)
BLADDER SCAN PT HAD TOTAL OF 

## 2017-04-19 NOTE — Procedures (Signed)
Arterial Catheter Insertion Procedure Note JAZIR LICHTENWALNER [id number] 05/01/1950  Procedure: Insertion of Arterial Catheter  Indications: Blood pressure monitoring and Frequent blood sampling  Procedure Details Consent: Unable to obtain consent because of emergent medical necessity. Time Out: Verified patient identification, verified procedure, site/side was marked, verified correct patient position, special equipment/implants available, medications/allergies/relevent history reviewed, required imaging and test results available.  Performed  Maximum sterile technique was used including antiseptics, cap, gloves, gown, hand hygiene, mask and sheet. Skin prep: Chlorhexidine; local anesthetic administered 20 gauge catheter was inserted into right radial artery using the Seldinger technique.  Evaluation Blood flow good; BP tracing good. Complications: No apparent complications.   Koren Bound May 06, 2017

## 2017-04-19 NOTE — ED Notes (Signed)
Bed: OY77 Expected date:  Expected time:  Means of arrival:  Comments: EMS 67 yo male penile implant Friday-pain

## 2017-04-19 NOTE — ED Notes (Signed)
Delay in calling report due to multiple orders pending.

## 2017-04-19 NOTE — Progress Notes (Signed)
Another code blue was called. ACLS  Medications started. Girlfriend is at the bedside.  Pupils non reactive and dilated.  Pt was pronounced dead at 1855. 2 RNs verified no lung or heat sounds.  Magnet placed over Defibrillator. Chaplin in the room. Emotional support was given. Family called and notified.

## 2017-04-19 NOTE — Progress Notes (Signed)
Shunt was pronounced dead at 6:55 PM on Apr 24, 2017 was called for a code at the time I got here the code team was done with 20 minutes of ACLS 6 rounds of epi were patient he had a partial code withchest compressions but I advised them to stop the code which was stopped at 6050 5 PM on 2017/04/24 patient was pronounced at that paperwork to be completed discharge summary and death summary and the certificate to be completed by the primary team.

## 2017-04-19 NOTE — Progress Notes (Signed)
Inpatient Diabetes Program Recommendations  AACE/ADA: New Consensus Statement on Inpatient Glycemic Control (2015)  Target Ranges:  Prepandial:   less than 140 mg/dL      Peak postprandial:   less than 180 mg/dL (1-2 hours)      Critically ill patients:  140 - 180 mg/dL   Lab Results  Component Value Date   GLUCAP 211 (H) 04/29/17   HGBA1C 9.3 (H) 04/09/2017    Review of Glycemic Control  Diabetes history: DM2 Outpatient Diabetes medications: Basaglar 18 units am + 16 units pm + Amaryl 2 mg am + 4 mg pm Current orders for Inpatient glycemic control: No orders  Inpatient Diabetes Program Recommendations:   -Glycemic control order set with Novolog sensitive correction q 4 hrs.  Thank you, Maxwell Fitzgerald. Anyelin Mogle, RN, MSN, CDE  Diabetes Coordinator Inpatient Glycemic Control Team Team Pager [phone] (8am-5pm) 29-Apr-2017 3:09 PM

## 2017-04-19 NOTE — ED Triage Notes (Signed)
Pt comes to ed, via ems, c/o of abdominal pain, pain score 4 out 10. No nausea and vomiting. Epigastric center area, lower groin. Abdominal distention, v/s on arrival 110/50, hr 60, rr16, spo2 100.  Alert x 4, walks, on home pain meds.  Post surgical procedure from Friday, urinary [facility] placed here at [facility].  Wife claims he is confused.

## 2017-04-19 NOTE — Progress Notes (Signed)
Call from DR NAnavati of ER - patient waiting bed to go to [facility] and is curently in [place] ER  Unclear if patient making urine  Having persistent hyperkalemia and rhythm issues needing termporizing bic and calcium Bic gtt not started yet; going to be imminently  Plan  - d/w Dr Allena Katz - renal - he will come by to see but said if patient making urine and wen can fluid resusictate and no rhtym issues - likely will not need HD. However, if rhythm issues +, anuric then will  Need HD.  - Place foley: so have asked ER to look into foely placement (per Dr Rhunette Croft, urology approved it)  - 2L LR bolus and then 125cc/h - start bic gtt asap - Dr Tennis Must CCM MD app to consider HD cath baed on above    Dr. Kalman Shan, M.D., [facility].C.P Pulmonary and Critical Care Medicine Staff Physician, [facility] Center Director - Interstitial Lung Disease  Program  Pulmonary Fibrosis [facility] at [facility] [place], [place], [address]  Pager: [phone], If no answer or between  15:00h - 7:00h: call [phone] Telephone: [phone]

## 2017-04-19 NOTE — Procedures (Signed)
Central Venous dialysis Catheter Insertion Procedure Note Maxwell Fitzgerald [id number] 1951-01-08  Procedure: Insertion of Central Venous Catheter Indications: possible dialysis   Procedure Details Consent: Unable to obtain consent because of altered level of consciousness. Time Out: Verified patient identification, verified procedure, site/side was marked, verified correct patient position, special equipment/implants available, medications/allergies/relevent history reviewed, required imaging and test results available.  Performed Real time [place] used to ID and cannulate vessel  Maximum sterile technique was used including antiseptics, cap, gloves, gown, hand hygiene, mask and sheet. Skin prep: Chlorhexidine; local anesthetic administered A antimicrobial bonded/coated triple lumen catheter was placed in the left internal jugular vein using the Seldinger technique.  Evaluation Blood flow good Complications: No apparent complications Patient did tolerate procedure well. Chest X-ray ordered to verify placement.  CXR: pending.  [name] 04/27/2017, 10:48 AM  [name] ACNP-BC [facility] Pulmonary/Critical Care Pager # [phone] OR # [phone] if no answer

## 2017-04-19 NOTE — Procedures (Signed)
Central Venous Catheter Insertion Procedure Note Maxwell Fitzgerald [id number] 01-Nov-1950  Procedure: Insertion of Central Venous Catheter Indications: Assessment of intravascular volume, Drug and/or fluid administration and Frequent blood sampling  Procedure Details Consent: Unable to obtain consent because of emergent medical necessity. Time Out: Verified patient identification, verified procedure, site/side was marked, verified correct patient position, special equipment/implants available, medications/allergies/relevent history reviewed, required imaging and test results available.  Performed  Maximum sterile technique was used including antiseptics, cap, gloves, gown, hand hygiene, mask and sheet. Skin prep: Chlorhexidine; local anesthetic administered A antimicrobial bonded/coated triple lumen catheter was placed in the right subclavian vein using the Seldinger technique.  Evaluation Blood flow good Complications: No apparent complications Patient did tolerate procedure well. Chest X-ray ordered to verify placement.  CXR: pending.  Maxwell Fitzgerald 05-04-17, 4:55 PM

## 2017-04-19 NOTE — Progress Notes (Signed)
Pharmacy Antibiotic Note  Maxwell Fitzgerald is a 67 y.o. male admitted on May 14, 2017 with sepsis.  Pharmacy has been consulted for vancomycin and zosyn dosing. Pt with AoCKD, starting CRRT  Plan: Vancomycin 1500 mg IV loading dose, then 1000 mg IV Q 24 hts Continue zosyn 3.375g IV Q 6 hrs as already ordered.  F/u CRRT plan Vancomycin trough at steady state.     Temp (24hrs), Avg:97.4 F (36.3 C), Min:97.3 F (36.3 C), Max:97.4 F (36.3 C)  Recent Labs  Lab 04/12/17 1013 2017-05-14 0352 05-14-17 0413 2017/05/14 0851 2017/05/14 0920 2017-05-14 1517  WBC  --  19.1*  --   --   --   --   CREATININE 3.33* 7.13* 6.70* 6.70*  --  6.30*  LATICACIDVEN  --   --  2.76*  --  1.1  --     Estimated Creatinine Clearance: 12.7 mL/min (A) (by C-G formula based on SCr of 6.3 mg/dL (H)).    Allergies  Allergen Reactions  . Potassium Chloride Other (See Comments)    Kidney failure & heart EF issues - in large doses  . Potassium-Containing Compounds Other (See Comments)    Kidney failure: Specifically Potassium Chloride - in large doses  . Metformin And Related Other (See Comments)    Bothered liver     Antimicrobials this admission: Vancomycin 2/27 >>  Zosyn 2/27  >>   Dose adjustments this admission:  Microbiology results: 2/27 BCx:  2/27 UCx:   2/27 MRSA PCR: neg  Thank you for allowing pharmacy to be a part of this patient's care.  Bayard Hugger, PharmD, BCPS  Clinical Pharmacist  Pager: [phone]   05/14/2017 4:54 PM

## 2017-04-19 NOTE — Consult Note (Addendum)
Reason for Consult: Abdominal pain Referring Physician: D Rane Blitch is an 67 y.o. male.  HPI: Patient is a 67 year old male with multiple medical issues who presented to the ED this morning around 2:30 PM with abdominal pain nausea or vomiting.  Pain was midepigastric area some abdominal distention vital signs are stable on admission.  He was hospitalized 2/2-2/23/19, after placement of a 3 piece inflatable prosthesis for erectile dysfunction.  Reported problems started 2 days prior to admission.  Generalized severe pain associated with nausea.  There is also some associated groin pain.  Workup in the ED shows she is afebrile, blood pressure was 141/113 on admission.  Admission labs shows a potassium of 7.3, a glucose of 203, BUN 189, and a creatinine of 7.1 degrees.  Lipase was 152, AST 864, ALT 493,Bilirubin 1.2.  WBC 19.1.  Hemoglobin 12.7, hematocrit 40.1, left shift on differential.  Troponin 0.2.  Repeat potassium was 7.1.  Blood gases at 5:50 AM shows pH 7.15, PCO2 of 56, PO2 of 114, bicarb of 19.2.  CT scan without contrast shows a probable gallstone, some mild peripancreatic haziness related to ascites.  Extensive colonic diverticulosis, no bowel obstruction or active inflammatory disease with a normal appendix.  Irregularity of the liver contour suspicious for early cirrhosis.  Patient has one episode where he was not arousable he was bagged and treated with bicarb and calcium carbonate.  We are asked to see.  Past Medical History:  Diagnosis Date  . AICD (automatic cardioverter/defibrillator) present   . Anxiety   . Atrial fibrillation, permanent ([place])    on warfarin; recurred after DCCV  . Biventricular ICD (implantable cardioverter-defibrillator) in place    implanted by Dr. Lovena Le November 2014  . Cardiomyopathy, EF 30-35% by echo 03/2012 05/19/2012   Decreased from 2009 Echo, 40-45%  . CHF (congestive heart failure) ([place])   . Chronic anticoagulation    on coumadin  .  CKD (chronic kidney disease) stage 3, GFR 30-59 ml/min (HCC)    Baseline BUN/Cr 09/2011 - 36/1.44  . Cold    with cough clear mucous since 04-07-17  . Coronary artery disease    s/p CABG 1998; Myoview 2008 mild septal ischemia -no cath,  . Hypertension   . LBBB (left bundle branch block)    chronic  . PAD (peripheral artery disease) ([place]) 1997   Bilateral SFA PTA in 1997  . Peripheral neuropathy    4 toes left foot great and small toe, right foot great and small toe  . Renal disorder   . Type 2 diabetes mellitus ([place])    type 2 x 28  yrs    Past Surgical History:  Procedure Laterality Date  . BI-VENTRICULAR IMPLANTABLE CARDIOVERTER DEFIBRILLATOR N/A 01/08/2013   Procedure: BI-VENTRICULAR IMPLANTABLE CARDIOVERTER DEFIBRILLATOR  (CRT-D);  Surgeon: Evans Lance, MD;  Location[facility] CATH LAB;  Service: Cardiovascular;  Laterality: N/A;  . BI-VENTRICULAR IMPLANTABLE CARDIOVERTER DEFIBRILLATOR  (CRT-D)  01-08-2013   STJ CRTD implanted by Dr Lovena Le for ischemic cardiomyopathy, CHF, and LBBB  . CORONARY ARTERY BYPASS GRAFT  03/02/1996   x7, LIMA-LAD, SVG-OM1, SVG-OM2, SVG-OM3, SVG-PDA, SVG-PLA  . KNEE ARTHROSCOPY Right 03/07/2015   Procedure: ARTHROSCOPY RIGHT KNEE WITH DEBRIDEMENT;  Surgeon: Paralee Cancel, MD;  Location: [facility] ORS;  Service: Orthopedics;  Laterality: Right;  . PENILE PROSTHESIS IMPLANT N/A 04/12/2017   Procedure: PENILE PROTHESIS INFLATABLE;  Surgeon: Kathie Rhodes, MD;  Location: [facility] ORS;  Service: Urology;  Laterality: N/A;  . PERIPHERAL VASCULAR INTERVENTION  06/1996   Bilateral SFA PTA  . PERIPHERAL VASCULAR INTERVENTION  06/30/1996   R SFA-high grade 95% focal stenosis crossed w/ a 5x82m Cordis "Opti-5" balloon dilated at 5atm-56sec, 7atm-90sec, and 5atm-120sec    Family History  Problem Relation Age of Onset  . CAD Mother   . Diabetes Mother   . CAD Father   . Diabetes Father   . CAD Brother     Social History:  reports that he quit smoking about 21 years ago. He has  a 30.00 pack-year smoking history. he has never used smokeless tobacco. He reports that he drinks alcohol. He reports that he does not use drugs.  Allergies:  Allergies  Allergen Reactions  . Potassium Chloride Other (See Comments)    Kidney failure & heart EF issues - in large doses  . Potassium-Containing Compounds Other (See Comments)    Kidney failure: Specifically Potassium Chloride - in large doses  . Metformin And Related Other (See Comments)    Bothered liver     Prior to Admission medications   Medication Sig Start Date End Date Taking? Authorizing Provider  allopurinol (ZYLOPRIM) 100 MG tablet Take 100 mg by mouth 2 (two) times daily.    Yes [provider]  amoxicillin-clavulanate (AUGMENTIN) 875-125 MG tablet Take 1 tablet by mouth 2 (two) times daily for 10 days. 04/12/17 04/22/17 Yes OKathie Rhodes MD  atorvastatin (LIPITOR) 20 MG tablet Take 1 tablet (20 mg total) by mouth daily at 6 PM. Patient taking differently: Take 20 mg by mouth daily.  05/21/12  Yes HModena Jansky MD  calcitRIOL (ROCALTROL) 0.25 MCG capsule Take 0.25 mcg by mouth every Monday, Wednesday, and Friday.  07/10/13  Yes [provider]  clonazePAM (KLONOPIN) 1 MG tablet Take 1 mg by mouth at bedtime.   Yes [provider]  colchicine 0.6 MG tablet Take 0.6 mg by mouth daily as needed (for flare ups).    Yes [provider]  diltiazem (CARDIZEM) 60 MG tablet Take 60 mg by mouth 2 (two) times daily.   Yes [provider]  furosemide (LASIX) 40 MG tablet Take 40 mg by mouth 2 (two) times daily.   Yes [provider]  glimepiride (AMARYL) 2 MG tablet Take 2-4 mg by mouth See admin instructions. Take 2 mg by mouth in the morning and take 4 mg by mouth at bedtime   Yes [provider]  guaiFENesin (MUCINEX) 600 MG 12 hr tablet Take 1,200 mg by mouth 2 (two) times daily as needed for cough.   Yes [provider]  Insulin Glargine (BASAGLAR  KWIKPEN) 100 UNIT/ML SOPN Inject 16-18 Units into the skin See admin instructions. Inject 18 units SQ in the morning and inject 16 units SQ in the evening   Yes [provider]  losartan (COZAAR) 50 MG tablet Take 50 mg by mouth every evening.  01/09/13  Yes TEvans Lance MD  metoprolol (LOPRESSOR) 50 MG tablet Take 1 tablet (50 mg total) by mouth 2 (two) times daily. 04/09/14  Yes TEvans Lance MD  metoprolol tartrate (LOPRESSOR) 25 MG tablet TAKE 3 TABLETS (75 MG TOTAL) BY MOUTH 2 (TWO) TIMES DAILY. Patient taking differently: Take 25 mg by mouth twice daily. Take with 50 mg to = 75 mg 09/06/15  Yes BLorretta Harp MD  naproxen sodium (ALEVE) 220 MG tablet Take 220 mg by mouth daily as needed (for pain or headache).   Yes [provider]  Oxycodone HCl 10  MG TABS Take 1 tablet (10 mg total) by mouth every 4 (four) hours as needed. Patient taking differently: Take 10 mg by mouth every 4 (four) hours as needed (pain).  04/12/17  Yes Kathie Rhodes, MD  Phenylephrine-Acetaminophen (VICKS DAYQUIL SINUS PO) Take 30 mLs by mouth every 8 (eight) hours as needed. Cough   Yes [provider]  sodium polystyrene (KAYEXALATE) 15 GM/60ML suspension Take 15 g by mouth every Wednesday.    Yes [provider]  triamcinolone cream (KENALOG) 0.1 % Apply 1 application topically every other day.   Yes [provider]  warfarin (COUMADIN) 5 MG tablet TAKE ONE TABLET BY MOUTH DAILY OR AS DIRECTED BY COUMADIN CLINIC Patient taking differently: Take 7.5 mg by mouth daily on Sunday and Thursday. Take 5 mg by mouth daily on all other days 12/17/16  Yes Lorretta Harp, MD  [facility] EX) Apply 1 patch topically daily as needed (for pain).    [provider]     Results for orders placed or performed during the hospital encounter of 05-16-17 (from the past 48 hour(s))  ABO/Rh     Status: None   Collection Time: May 16, 2017  3:35 AM  Result Value Ref  Range   ABO/RH(D)      A POS Performed at [facility] [address] [place], [place] [address]   Comprehensive metabolic panel     Status: Abnormal   Collection Time: 05/16/17  3:52 AM  Result Value Ref Range   Sodium 134 (L) 135 - 145 mmol/L   Potassium 7.3 (HH) 3.5 - 5.1 mmol/L    Comment: NO VISIBLE HEMOLYSIS CRITICAL RESULT CALLED TO, READ BACK BY AND VERIFIED WITH: OXIDINE,J RN May 16, 2017 '@[id number]'  ZANDO,C    Chloride 96 (L) 101 - 111 mmol/L   CO2 22 22 - 32 mmol/L   Glucose, Bld 203 (H) 65 - 99 mg/dL   BUN 189 (H) 6 - 20 mg/dL    Comment: RESULTS CONFIRMED BY MANUAL DILUTION   Creatinine, Ser 7.13 (H) 0.61 - 1.24 mg/dL   Calcium 8.1 (L) 8.9 - 10.3 mg/dL   Total Protein 6.4 (L) 6.5 - 8.1 g/dL   Albumin 3.5 3.5 - 5.0 g/dL   AST 864 (H) 15 - 41 U/L   ALT 493 (H) 17 - 63 U/L   Alkaline Phosphatase 90 38 - 126 U/L   Total Bilirubin 1.2 0.3 - 1.2 mg/dL   GFR calc non Af Amer 7 (L) >60 mL/min   GFR calc Af Amer 8 (L) >60 mL/min    Comment: (NOTE) The eGFR has been calculated using the CKD EPI equation. This calculation has not been validated in all clinical situations. eGFR's persistently <60 mL/min signify possible Chronic Kidney Disease.    Anion gap 16 (H) 5 - 15    Comment: Performed at [facility], [place] [address], [place], [place] [address]  CBC with Differential/Platelet     Status: Abnormal   Collection Time: 05-16-2017  3:52 AM  Result Value Ref Range   WBC 19.1 (H) 4.0 - 10.5 K/uL   RBC 3.76 (L) 4.22 - 5.81 MIL/uL   Hemoglobin 12.7 (L) 13.0 - 17.0 g/dL   HCT 40.1 39.0 - 52.0 %   MCV 106.6 (H) 78.0 - 100.0 fL   MCH 33.8 26.0 - 34.0 pg   MCHC 31.7 30.0 - 36.0 g/dL   RDW 15.1 11.5 - 15.5 %   Platelets 216 150 - 400 K/uL  Neutrophils Relative % 89 %   Neutro Abs 16.9 (H) 1.7 - 7.7 K/uL   Lymphocytes Relative 7 %   Lymphs Abs 1.4 0.7 - 4.0 K/uL   Monocytes Relative 4 %   Monocytes Absolute 0.8 0.1 - 1.0 K/uL   Eosinophils  Relative 0 %   Eosinophils Absolute 0.0 0.0 - 0.7 K/uL   Basophils Relative 0 %   Basophils Absolute 0.0 0.0 - 0.1 K/uL    Comment: Performed at [facility], [place] [address], [place], [place] [address]  Protime-INR     Status: Abnormal   Collection Time: 2017-04-22  3:52 AM  Result Value Ref Range   Prothrombin Time 21.7 (H) 11.4 - 15.2 seconds   INR 1.91     Comment: Performed at [facility], [place] [address] [place], [place] [address]  Lipase, blood     Status: Abnormal   Collection Time: 2017-04-22  3:52 AM  Result Value Ref Range   Lipase 152 (H) 11 - 51 U/L    Comment: Performed at [facility], [place] [address], [place], [place] [address]  CBG monitoring, ED     Status: Abnormal   Collection Time: 04-22-2017  4:04 AM  Result Value Ref Range   Glucose-Capillary 186 (H) 65 - 99 mg/dL  Type and screen     Status: None   Collection Time: 22-Apr-2017  4:08 AM  Result Value Ref Range   ABO/RH(D) A POS    Antibody Screen NEG    Sample Expiration      04/20/2017 Performed at [facility], [place] [address], [place], [place] [address]   I-stat troponin, ED     Status: Abnormal   Collection Time: April 22, 2017  4:11 AM  Result Value Ref Range   Troponin i, poc 0.20 (HH) 0.00 - 0.08 ng/mL   Comment NOTIFIED PHYSICIAN    Comment 3            Comment: Due to the release kinetics of cTnI, a negative result within the first hours of the onset of symptoms does not rule out myocardial infarction with certainty. If myocardial infarction is still suspected, repeat the test at appropriate intervals.   I-Stat CG4 Lactic Acid, ED     Status: Abnormal   Collection Time: 04/22/17  4:13 AM  Result Value Ref Range   Lactic Acid, Venous 2.76 (HH) 0.5 - 1.9 mmol/L   Comment NOTIFIED PHYSICIAN   I-stat chem 8, ed     Status: Abnormal   Collection Time: Apr 22, 2017  4:13 AM  Result Value Ref Range   Sodium 131 (L) 135 - 145 mmol/L    Potassium 7.1 (HH) 3.5 - 5.1 mmol/L   Chloride 98 (L) 101 - 111 mmol/L   BUN >140 (H) 6 - 20 mg/dL   Creatinine, Ser 6.70 (H) 0.61 - 1.24 mg/dL   Glucose, Bld 185 (H) 65 - 99 mg/dL   Calcium, Ion 1.00 (L) 1.15 - 1.40 mmol/L   TCO2 24 22 - 32 mmol/L   Hemoglobin 13.9 13.0 - 17.0 g/dL   HCT 41.0 39.0 - 52.0 %   Comment NOTIFIED PHYSICIAN   Blood gas, arterial     Status: Abnormal   Collection Time: April 22, 2017  5:50 AM  Result Value Ref Range   O2 Content 4.0 L/min   Delivery systems NASAL CANNULA    LHR 18 resp/min   pH, Arterial 7.156 (LL) 7.350 - 7.450    Comment: CRITICAL RESULT  CALLED TO, READ BACK BY AND VERIFIED WITH: DR. DONALD WICKLINE AT 1610 ON 2017/05/08 BY PATRICK SWEENEY RRT, RCP    pCO2 arterial 56.2 (H) 32.0 - 48.0 mmHg   pO2, Arterial 114 (H) 83.0 - 108.0 mmHg   Bicarbonate 19.2 (L) 20.0 - 28.0 mmol/L   Acid-base deficit 9.7 (H) 0.0 - 2.0 mmol/L   O2 Saturation 96.3 %   Patient temperature 97.4    Collection site LEFT BRACHIAL    Drawn by [id number]    Sample type ARTERIAL    Allens test (pass/fail) PASS PASS    Comment: Performed at [facility], [place] [address] [place], [place] [address]    Ct Abdomen Pelvis Wo Contrast  Result Date: 05-08-2017 CLINICAL DATA:  67 year old male with abdominal distention and pain. EXAM: CT ABDOMEN AND PELVIS WITHOUT CONTRAST TECHNIQUE: Multidetector CT imaging of the abdomen and pelvis was performed following the standard protocol without IV contrast. COMPARISON:  Renal ultrasound dated 05/20/2012 FINDINGS: Evaluation of this exam is limited in the absence of intravenous contrast. Lower chest: The visualized lung bases are clear. There is hypoattenuation of the cardiac blood pool suggestive of a degree of anemia. Clinical correlation is recommended. Partially visualized pacemaker wires. No intra-abdominal free air.  Small ascites. Hepatobiliary: There is slight irregularity of the liver contour which may represent early  changes of cirrhosis. Correlation with clinical exam and liver function tests recommended. No intrahepatic biliary ductal dilatation. High attenuating content within the gallbladder may represent sludge or stones. There is mild thickened and hazy appearance of the gallbladder wall which may be partly related to underdistention. Acute cholecystitis is not excluded. Further evaluation with ultrasound is recommended. Pancreas: There is minimal haziness of the peripancreatic fat. Correlation with pancreatic enzymes recommended to exclude pancreatitis. Punctate calcific density in the uncinate process of the pancreas likely sequela of chronic inflammation. Spleen: Normal in size without focal abnormality. Adrenals/Urinary Tract: The adrenal glands are unremarkable. Mild bilateral renal parenchyma atrophy. There is no hydronephrosis or nephrolithiasis on either side. The visualized ureters and urinary bladder appear unremarkable. Stomach/Bowel: There is extensive sigmoid diverticulosis with muscular hypertrophy. No definite active inflammatory changes. Scattered colonic diverticula noted. There is no bowel obstruction or active inflammation. Normal appendix. Vascular/Lymphatic: Advanced aortoiliac atherosclerotic disease. The IVC appears unremarkable on this noncontrast CT. No portal venous gas. There is no adenopathy. Reproductive: The prostate and seminal vesicles are grossly unremarkable. Penile implant with reservoir in the right hemipelvis. Other: Mild diffuse subcutaneous edema. Musculoskeletal: Degenerative changes of the spine. Moderate right hip arthritic changes. No acute osseous pathology. IMPRESSION: 1. Probable gallstone. Further evaluation with ultrasound recommended to exclude acute cholecystitis. 2. Mild peripancreatic haziness possibly related to ascites. Correlation with pancreatic enzymes recommended to exclude pancreatitis. 3. Extensive colonic diverticulosis. No bowel obstruction or active  inflammation. Normal appendix. 4. Small ascites. 5. Slight irregularity of the liver contour suspicious for early changes of cirrhosis. Clinical correlation is recommended. 6. Advanced Aortic Atherosclerosis (ICD10-I70.0). Electronically Signed   By: Anner Crete M.D.   On: 05-08-17 06:01   Dg Chest Port 1 View  Result Date: 05-08-17 CLINICAL DATA:  67 year old male with shortness of breath. EXAM: PORTABLE CHEST 1 VIEW COMPARISON:  Chest radiograph dated 05/23/2012 FINDINGS: The lungs are clear. There is no pleural effusion or pneumothorax. Borderline cardiomegaly. Median sternotomy wires and left pectoral pacemaker device. No acute osseous pathology. IMPRESSION: No acute cardiopulmonary process. Electronically Signed   By: Anner Crete M.D.   On: 05-08-17 04:13  Review of Systems  Constitutional: Negative for chills, diaphoresis, fever, malaise/fatigue and weight loss.       His girlfriend says he has been having episodes of confusion  ever since his discharge on 04/13/17.  HENT: Negative.   Eyes: Negative.   Respiratory: Positive for cough, shortness of breath and wheezing.   Cardiovascular: Positive for claudication and PND. Negative for chest pain, palpitations, orthopnea and leg swelling.  Gastrointestinal: Positive for abdominal pain, blood in stool (Is a history of fissures so occasionally blood in his stool.), constipation (His girlfriend gave him some MiraLAX for constipation but had a bowel movement yesterday.) and nausea. Negative for diarrhea, heartburn, melena and vomiting.  Genitourinary: Negative for dysuria, flank pain, frequency, hematuria and urgency.  Musculoskeletal: Negative.   Skin: Negative for itching and rash.       He has ecchymosis in the right lower quadrant left lower quadrant.  Perineum penis and scrotum are black and blue with ecchymosis.  Neurological: Negative for weakness.       Patient was confused at home.  Here in the ED he has had one episode  of apnea.  While I was talking to him he was speaking and answering coherently in a clear voice, and then his voice just got softer he was confused.  When I shook him he became alert again and spoke again in the full loud voice with confusion improved.  Psychiatric/Behavioral:       His girlfriend Katharine Look is with him and says he has been confused intermittently ever since he went home after his prosthesis implant.   Blood pressure 110/66, pulse 70, temperature (!) 97.4 F (36.3 C), temperature source Oral, resp. rate 11, SpO2 94 %. Physical Exam  Constitutional: He appears well-nourished. No distress.  Elderly appearing male, in resuscitation  A bay in the ED.  He was initially alert but had an episode where he became confused, and then came out of it fairly quickly. He is on telemetry and appears to be V paced.  O2 saturations are diminished but it does not appear to be tracking very well.  HENT:  Head: Normocephalic and atraumatic.  Mouth/Throat: No oropharyngeal exudate.  Eyes: Right eye exhibits no discharge. Left eye exhibits no discharge. No scleral icterus.  Pupils are equal  Neck: Normal range of motion. Neck supple. No JVD present. No tracheal deviation present. No thyromegaly present.  Cardiovascular: Normal rate, regular rhythm and normal heart sounds.  No murmur heard. Is a midline sternotomy incision and a permanent transvenous pacemaker left upper chest.  I did not feel distal pulses.  Respiratory: Effort normal. No respiratory distress. He has wheezes. He has rales. He exhibits no tenderness.  As noted above he has midline sternotomy incisions well-healed.  GI: Soft. He exhibits no mass. There is tenderness. There is no rebound and no guarding.  He has a large abdomen when you ask him where his pain is he complains of tenderness in the left upper quadrant.  He is somewhat tender over the right upper quadrant.  He has ecchymosis in areas both on the right and left sides of the  lower abdomen.  He does not have peritonitis.  Patient is morbidly obese but does not appear overly distended.  Genitourinary: Penile tenderness present.  Genitourinary Comments: He has a dark blue to black ecchymosis in the perineum, penis and scrotum.  Surgical incision appears to be intact.  Musculoskeletal: He exhibits edema (Trace lower extremity edema.).  Lymphadenopathy:    He  has no cervical adenopathy.  Neurological: He is alert. No cranial nerve deficit.  Patient was initially alert and oriented and discussing a loud voice.  Then he had an episode where her voice became soft and he was confused.  I watched him do this for a few seconds and then shook him to see if he was fully awake. He immediately resumed his normal speech and was once again alert.  His girlfriend says he has been having these episodes where he gets confused at home ever since discharge after his penile implant.  Skin: Skin is warm and dry. No rash noted. He is not diaphoretic. No erythema. No pallor.  Psychiatric:  Please note above.  He has had episodes of confusion since his discharge on 04/13/17.    Assessment/Plan: Abdominal pain, with possible cholelithiasis on noncontrast CT. Acute renal failure on chronic renal disease. Hyperkalemia Acute liver failure Lactic acidosis Hypoxia Coronary artery disease Congestive heart failure with EF 30-35% AICD/permanent transvenous pacemaker/permanent left bundle branch block Type 2 diabetes Peripheral neuropathy referral vascular disease  Plan: From our standpoint his gallbladder/cholelithiasis is fairly low on his problem list currently.  Once his renal disease and hyperkalemia have been resolved.  We would get a abdominal ultrasound, and possibly even a HIDA once he is stable.  At this point the most we would offer would be a IR drain if his gallbladder is the source of his abdominal discomfort.    He is awaiting renal consult and we will follow with you either here at  [facility] long or at [facility] once the care plan is completed.  JENNINGS,WILLARD 2017-04-23, 7:20 AM   Agree with above. He does not have a surgical abdomen. He is going to be transferred to [facility] because of probable need for dialysis.  Alphonsa Overall, MD, [facility] Surgery Pager: [phone] Office phone:  [phone]

## 2017-04-19 NOTE — ED Notes (Signed)
Pt started to not breathe and was not arousal able,  Charge informed  Charge called code and doctor was called  Pt was bagged and provider took over code  Pt was administered Bicarb order  Pt was administered calcium carbonate iv push  pt was given 1000 nacl bolus

## 2017-04-19 NOTE — ED Provider Notes (Signed)
[facility]-EMERGENCY DEPT Provider Note   CSN: [id number] Arrival date & time: 01-May-2017  0231     History   Chief Complaint Chief Complaint  Patient presents with  . Abdominal Pain  . Groin Pain    post surgery friday     HPI Maxwell Fitzgerald is a 67 y.o. male.  The history is provided by the patient and a significant other. The history is limited by the condition of the patient.  Abdominal Pain   This is a new problem. The current episode started more than 2 days ago. The problem occurs daily. The problem has been gradually worsening. The pain is located in the generalized abdominal region. The pain is severe. Associated symptoms include nausea. Pertinent negatives include fever and vomiting. The symptoms are aggravated by certain positions. Nothing relieves the symptoms.  Groin Pain  Associated symptoms include abdominal pain.  Patient with History of atrial for ablation, has ICD in place, cardiomyopathy presents with abdominal pain. He underwent a penile prosthesis implant on February 22.  No complications noted.  However over the past several days he began having worsening abdominal pain.  He reports feeling nauseous.  He reports feeling weakness.  His significant other reports that he has also been confused.  He reports he has been producing urine output.  Past Medical History:  Diagnosis Date  . AICD (automatic cardioverter/defibrillator) present   . Anxiety   . Atrial fibrillation, permanent (HCC)    on warfarin; recurred after DCCV  . Biventricular ICD (implantable cardioverter-defibrillator) in place    implanted by Dr. Ladona Ridgel November 2014  . Cardiomyopathy, EF 30-35% by echo 03/2012 05/19/2012   Decreased from 2009 Echo, 40-45%  . CHF (congestive heart failure) (HCC)   . Chronic anticoagulation    on coumadin  . CKD (chronic kidney disease) stage 3, GFR 30-59 ml/min (HCC)    Baseline BUN/Cr 09/2011 - 36/1.44  . Cold    with cough clear mucous  since 04-07-17  . Coronary artery disease    s/p CABG 1998; Myoview 2008 mild septal ischemia -no cath,  . Hypertension   . LBBB (left bundle branch block)    chronic  . PAD (peripheral artery disease) (HCC) 1997   Bilateral SFA PTA in 1997  . Peripheral neuropathy    4 toes left foot great and small toe, right foot great and small toe  . Renal disorder   . Type 2 diabetes mellitus (HCC)    type 2 x 28  yrs    Patient Active Problem List   Diagnosis Date Noted  . 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 Biventricular ICD (implantable cardioverter-defibrillator) in place 01/07/2015  . Chronic systolic heart failure (HCC) 12/18/2012  . Renal failure, acute on chronic (HCC) 05/21/2012  . LBBB (left bundle branch block) 05/20/2012  . PAD (peripheral artery disease) (HCC) 05/20/2012  . Chronic anticoagulation 05/20/2012  . Dyslipidemia 05/20/2012  . ARF (acute renal failure) (HCC) 05/19/2012  . Hyperkalemia 05/19/2012  . Cardiomyopathy, EF 30-35% by echo 03/2012 05/19/2012  . Cardiorenal syndrome with renal failure - acute on chronic renal failure with worsening HF. 05/19/2012  . Atrial fibrillation with rapid ventricular response (HCC) 05/19/2012  . Acute on chronic right-sided HF (heart failure) 05/19/2012  . Acute on chronic combined systolic and diastolic congestive heart failure, NYHA class 3 (HCC)   . Type 2 diabetes mellitus (HCC)   . Coronary artery disease, hx of CABG 1998, LIMA-LAD;VG-OM1-OM2-OM3; VG-PDA/PLA   .  Atrial fibrillation, permanent   . Hypertension   . CKD (chronic kidney disease) stage 3, GFR 30-59 ml/min (HCC)     Past Surgical History:  Procedure Laterality Date  . BI-VENTRICULAR IMPLANTABLE CARDIOVERTER DEFIBRILLATOR N/A 01/08/2013   Procedure: BI-VENTRICULAR IMPLANTABLE CARDIOVERTER DEFIBRILLATOR  (CRT-D);  Surgeon: Marinus Maw, MD;  Location: [facility] CATH LAB;  Service: Cardiovascular;  Laterality: N/A;  . BI-VENTRICULAR IMPLANTABLE  CARDIOVERTER DEFIBRILLATOR  (CRT-D)  01-08-2013   STJ CRTD implanted by Dr Ladona Ridgel for ischemic cardiomyopathy, CHF, and LBBB  . CORONARY ARTERY BYPASS GRAFT  03/02/1996   x7, LIMA-LAD, SVG-OM1, SVG-OM2, SVG-OM3, SVG-PDA, SVG-PLA  . KNEE ARTHROSCOPY Right 03/07/2015   Procedure: ARTHROSCOPY RIGHT KNEE WITH DEBRIDEMENT;  Surgeon: Durene Romans, MD;  Location: [facility] ORS;  Service: Orthopedics;  Laterality: Right;  . PENILE PROSTHESIS IMPLANT N/A 04/12/2017   Procedure: PENILE PROTHESIS INFLATABLE;  Surgeon: Ihor Gully, MD;  Location: [facility] ORS;  Service: Urology;  Laterality: N/A;  . PERIPHERAL VASCULAR INTERVENTION  06/1996   Bilateral SFA PTA  . PERIPHERAL VASCULAR INTERVENTION  06/30/1996   R SFA-high grade 95% focal stenosis crossed w/ a 5x58mm Cordis "Opti-5" balloon dilated at 5atm-56sec, 7atm-90sec, and 5atm-120sec       Home Medications    Prior to Admission medications   Medication Sig Start Date End Date Taking? Authorizing Provider  allopurinol (ZYLOPRIM) 100 MG tablet Take 100 mg by mouth 2 (two) times daily.    Yes [provider]  amoxicillin-clavulanate (AUGMENTIN) 875-125 MG tablet Take 1 tablet by mouth 2 (two) times daily for 10 days. 04/12/17 04/22/17 Yes Ihor Gully, MD  atorvastatin (LIPITOR) 20 MG tablet Take 1 tablet (20 mg total) by mouth daily at 6 PM. Patient taking differently: Take 20 mg by mouth daily.  05/21/12  Yes Elease Etienne, MD  calcitRIOL (ROCALTROL) 0.25 MCG capsule Take 0.25 mcg by mouth every Monday, Wednesday, and Friday.  07/10/13  Yes [provider]  clonazePAM (KLONOPIN) 1 MG tablet Take 1 mg by mouth at bedtime.   Yes [provider]  colchicine 0.6 MG tablet Take 0.6 mg by mouth daily as needed (for flare ups).    Yes [provider]  diltiazem (CARDIZEM) 60 MG tablet Take 60 mg by mouth 2 (two) times daily.   Yes [provider]  furosemide (LASIX) 40 MG tablet Take 40 mg by mouth 2 (two) times daily.   Yes  [provider]  glimepiride (AMARYL) 2 MG tablet Take 2-4 mg by mouth See admin instructions. Take 2 mg by mouth in the morning and take 4 mg by mouth at bedtime   Yes [provider]  guaiFENesin (MUCINEX) 600 MG 12 hr tablet Take 1,200 mg by mouth 2 (two) times daily as needed for cough.   Yes [provider]  Insulin Glargine (BASAGLAR KWIKPEN) 100 UNIT/ML SOPN Inject 16-18 Units into the skin See admin instructions. Inject 18 units SQ in the morning and inject 16 units SQ in the evening   Yes [provider]  losartan (COZAAR) 50 MG tablet Take 50 mg by mouth every evening.  01/09/13  Yes Marinus Maw, MD  metoprolol (LOPRESSOR) 50 MG tablet Take 1 tablet (50 mg total) by mouth 2 (two) times daily. 04/09/14  Yes Marinus Maw, MD  metoprolol tartrate (LOPRESSOR) 25 MG tablet TAKE 3 TABLETS (75 MG TOTAL) BY MOUTH 2 (TWO) TIMES DAILY. Patient taking differently: Take 25 mg by mouth twice daily. Take with 50 mg to =  75 mg 09/06/15  Yes Runell Gess, MD  naproxen sodium (ALEVE) 220 MG tablet Take 220 mg by mouth daily as needed (for pain or headache).   Yes [provider]  Oxycodone HCl 10 MG TABS Take 1 tablet (10 mg total) by mouth every 4 (four) hours as needed. Patient taking differently: Take 10 mg by mouth every 4 (four) hours as needed (pain).  04/12/17  Yes Ihor Gully, MD  Phenylephrine-Acetaminophen (VICKS DAYQUIL SINUS PO) Take 30 mLs by mouth every 8 (eight) hours as needed. Cough   Yes [provider]  sodium polystyrene (KAYEXALATE) 15 GM/60ML suspension Take 15 g by mouth every Wednesday.    Yes [provider]  triamcinolone cream (KENALOG) 0.1 % Apply 1 application topically every other day.   Yes [provider]  warfarin (COUMADIN) 5 MG tablet TAKE ONE TABLET BY MOUTH DAILY OR AS DIRECTED BY COUMADIN CLINIC Patient taking differently: Take 7.5 mg by mouth daily on Sunday and Thursday. Take 5 mg by  mouth daily on all other days 12/17/16  Yes Runell Gess, MD  [facility] EX) Apply 1 patch topically daily as needed (for pain).    [provider]    Family History Family History  Problem Relation Age of Onset  . CAD Mother   . Diabetes Mother   . CAD Father   . Diabetes Father   . CAD Brother     Social History Social History   Tobacco Use  . Smoking status: Former Smoker    Packs/day: 1.00    Years: 30.00    Pack years: 30.00    Last attempt to quit: 05/20/1995    Years since quitting: 21.9  . Smokeless tobacco: Never Used  Substance Use Topics  . Alcohol use: Yes    Comment: 2 days per week 2 drinks  . Drug use: No    Comment: quit in 1972     Allergies   Potassium chloride; Potassium-containing compounds; and Metformin and related   Review of Systems Review of Systems  Constitutional: Negative for fever.  Gastrointestinal: Positive for abdominal pain and nausea. Negative for vomiting.  Genitourinary: Negative for difficulty urinating.  Psychiatric/Behavioral: Positive for confusion.  All other systems reviewed and are negative.    Physical Exam Updated Vital Signs BP (!) 141/113 Comment: informed DR Bebe Shaggy  Pulse 70   Temp (!) 97.4 F (36.3 C) (Oral)   Resp 19   SpO2 95%   Physical Exam CONSTITUTIONAL: Elderly, ill-appearing HEAD: Normocephalic/atraumatic EYES: EOMI/PERRL ENMT: Mucous membranes dry NECK: supple no meningeal signs CV: S1/S2 noted LUNGS: Lungs are clear to auscultation bilaterally, no apparent distress ABDOMEN: soft, distended, diffuse moderate tenderness GU: Bruising noted throughout scrotum, but no tenderness, no crepitus.  Male nurse chaperone present for exam NEURO: Pt is awake/alert/appropriate, moves all extremitiesx4.  EXTREMITIES: Femoral pulses normal/equal, full ROM SKIN: Pale PSYCH: Anxious  ED Treatments / Results  Labs (all labs ordered are listed, but only abnormal results are  displayed) Labs Reviewed  COMPREHENSIVE METABOLIC PANEL - Abnormal; Notable for the following components:      Result Value   Sodium 134 (*)    Potassium 7.3 (*)    Chloride 96 (*)    Glucose, Bld 203 (*)    BUN 189 (*)    Creatinine, Ser 7.13 (*)    Calcium 8.1 (*)    Total Protein 6.4 (*)    AST 864 (*)    ALT 493 (*)  GFR calc non Af Amer 7 (*)    GFR calc Af Amer 8 (*)    Anion gap 16 (*)    All other components within normal limits  CBC WITH DIFFERENTIAL/PLATELET - Abnormal; Notable for the following components:   WBC 19.1 (*)    RBC 3.76 (*)    Hemoglobin 12.7 (*)    MCV 106.6 (*)    Neutro Abs 16.9 (*)    All other components within normal limits  PROTIME-INR - Abnormal; Notable for the following components:   Prothrombin Time 21.7 (*)    All other components within normal limits  BLOOD GAS, ARTERIAL - Abnormal; Notable for the following components:   pH, Arterial 7.156 (*)    pCO2 arterial 56.2 (*)    pO2, Arterial 114 (*)    Bicarbonate 19.2 (*)    Acid-base deficit 9.7 (*)    All other components within normal limits  LIPASE, BLOOD - Abnormal; Notable for the following components:   Lipase 152 (*)    All other components within normal limits  I-STAT CG4 LACTIC ACID, ED - Abnormal; Notable for the following components:   Lactic Acid, Venous 2.76 (*)    All other components within normal limits  I-STAT CHEM 8, ED - Abnormal; Notable for the following components:   Sodium 131 (*)    Potassium 7.1 (*)    Chloride 98 (*)    BUN >140 (*)    Creatinine, Ser 6.70 (*)    Glucose, Bld 185 (*)    Calcium, Ion 1.00 (*)    All other components within normal limits  I-STAT TROPONIN, ED - Abnormal; Notable for the following components:   Troponin i, poc 0.20 (*)    All other components within normal limits  CBG MONITORING, ED - Abnormal; Notable for the following components:   Glucose-Capillary 186 (*)    All other components within normal limits  URINE CULTURE    URINALYSIS, ROUTINE W REFLEX MICROSCOPIC  I-STAT CHEM 8, ED  I-STAT CHEM 8, ED  TYPE AND SCREEN  ABO/RH    EKG  EKG Interpretation  Date/Time:  Wednesday May 13, 2017 03:52:52 EST Ventricular Rate:  70 PR Interval:    QRS Duration: 220 QT Interval:  543 QTC Calculation: 587 R Axis:   -102 Text Interpretation:  Atrial fibrillation Ventricular premature complex Right bundle branch block Abnormal ekg Confirmed by Zadie Rhine ([id number]) on 05-13-17 4:07:15 AM       Radiology Ct Abdomen Pelvis Wo Contrast  Result Date: 2017/05/13 CLINICAL DATA:  67 year old male with abdominal distention and pain. EXAM: CT ABDOMEN AND PELVIS WITHOUT CONTRAST TECHNIQUE: Multidetector CT imaging of the abdomen and pelvis was performed following the standard protocol without IV contrast. COMPARISON:  Renal ultrasound dated 05/20/2012 FINDINGS: Evaluation of this exam is limited in the absence of intravenous contrast. Lower chest: The visualized lung bases are clear. There is hypoattenuation of the cardiac blood pool suggestive of a degree of anemia. Clinical correlation is recommended. Partially visualized pacemaker wires. No intra-abdominal free air.  Small ascites. Hepatobiliary: There is slight irregularity of the liver contour which may represent early changes of cirrhosis. Correlation with clinical exam and liver function tests recommended. No intrahepatic biliary ductal dilatation. High attenuating content within the gallbladder may represent sludge or stones. There is mild thickened and hazy appearance of the gallbladder wall which may be partly related to underdistention. Acute cholecystitis is not excluded. Further evaluation with ultrasound is recommended. Pancreas: There is minimal  haziness of the peripancreatic fat. Correlation with pancreatic enzymes recommended to exclude pancreatitis. Punctate calcific density in the uncinate process of the pancreas likely sequela of chronic inflammation.  Spleen: Normal in size without focal abnormality. Adrenals/Urinary Tract: The adrenal glands are unremarkable. Mild bilateral renal parenchyma atrophy. There is no hydronephrosis or nephrolithiasis on either side. The visualized ureters and urinary bladder appear unremarkable. Stomach/Bowel: There is extensive sigmoid diverticulosis with muscular hypertrophy. No definite active inflammatory changes. Scattered colonic diverticula noted. There is no bowel obstruction or active inflammation. Normal appendix. Vascular/Lymphatic: Advanced aortoiliac atherosclerotic disease. The IVC appears unremarkable on this noncontrast CT. No portal venous gas. There is no adenopathy. Reproductive: The prostate and seminal vesicles are grossly unremarkable. Penile implant with reservoir in the right hemipelvis. Other: Mild diffuse subcutaneous edema. Musculoskeletal: Degenerative changes of the spine. Moderate right hip arthritic changes. No acute osseous pathology. IMPRESSION: 1. Probable gallstone. Further evaluation with ultrasound recommended to exclude acute cholecystitis. 2. Mild peripancreatic haziness possibly related to ascites. Correlation with pancreatic enzymes recommended to exclude pancreatitis. 3. Extensive colonic diverticulosis. No bowel obstruction or active inflammation. Normal appendix. 4. Small ascites. 5. Slight irregularity of the liver contour suspicious for early changes of cirrhosis. Clinical correlation is recommended. 6. Advanced Aortic Atherosclerosis (ICD10-I70.0). Electronically Signed   By: Elgie Collard M.D.   On: 12-May-2017 06:01   Dg Chest Port 1 View  Result Date: May 12, 2017 CLINICAL DATA:  67 year old male with shortness of breath. EXAM: PORTABLE CHEST 1 VIEW COMPARISON:  Chest radiograph dated 05/23/2012 FINDINGS: The lungs are clear. There is no pleural effusion or pneumothorax. Borderline cardiomegaly. Median sternotomy wires and left pectoral pacemaker device. No acute osseous pathology.  IMPRESSION: No acute cardiopulmonary process. Electronically Signed   By: Elgie Collard M.D.   On: 05-12-17 04:13    Procedures Procedures  CRITICAL CARE Performed by: Joya Gaskins Total critical care time: 60 minutes Critical care time was exclusive of separately billable procedures and treating other patients. Critical care was necessary to treat or prevent imminent or life-threatening deterioration. Critical care was time spent personally by me on the following activities: development of treatment plan with patient and/or surrogate as well as nursing, discussions with consultants, evaluation of patient's response to treatment, examination of patient, obtaining history from patient or surrogate, ordering and performing treatments and interventions, ordering and review of laboratory studies, ordering and review of radiographic studies, pulse oximetry and re-evaluation of patient's condition. Patient with hyperkalemia requiring IV calcium.  Patient with metabolic acidosis.  Patient hasrequired multiple consult.  He will be admitted to ICU Medications Ordered in ED Medications  albuterol (PROVENTIL, VENTOLIN) (5 MG/ML) 0.5% continuous inhalation solution (  Not Given 05/12/2017 0552)  piperacillin-tazobactam (ZOSYN) IVPB 2.25 g (not administered)  fentaNYL (SUBLIMAZE) injection 50 mcg (50 mcg Intravenous Given May 12, 2017 0438)  sodium chloride 0.9 % bolus 1,000 mL (1,000 mLs Intravenous New Bag/Given 12-May-2017 0438)  insulin aspart (novoLOG) injection 10 Units (10 Units Intravenous Given 12-May-2017 0451)  dextrose 50 % solution 50 mL (50 mLs Intravenous Given 05/12/2017 0441)  sodium bicarbonate injection 50 mEq (50 mEq Intravenous Given 12-May-2017 0452)  calcium gluconate 1 g in sodium chloride 0.9 % 100 mL IVPB (1 g Intravenous Not Given May 12, 2017 0527)  albuterol (PROVENTIL) (2.5 MG/3ML) 0.083% nebulizer solution 5 mg (5 mg Nebulization Given May 12, 2017 0501)     Initial Impression / Assessment and  Plan / ED Course  I have reviewed the triage vital signs and the nursing notes.  Pertinent  labs & imaging results that were available during my care of the patient were reviewed by me and considered in my medical decision making (see chart for details).     4:25 AM Patient increasing abdominal pain since this recent penile implant surgery.  He is ill-appearing at this time.  He has diffuse abdominal tenderness.  His initial Chem-8 reveals severe renal failure with hyperkalemia greater than 7.  Review of labs reveal his creatinine baseline is around 3. Due to urgency of situation I have ordered calcium/insulin/dextrose/bicarb.  He will also received IV fluids After this, he will need CT imaging. Patient is critically ill at this time 5:11 AM Was called to room because patient had an apneic episode. He never lost pulses, but became hypotensive.  He never required CPR  my suspicion is that this is related to his hyperkalemia.  I ordered immediate use of calcium chloride.  He was also given insulin and dextrose.  And bicarb. He is now awake and alert, improved.  Blood pressure is improved over 100. Bladder scan does not reveal significant urinary retention. Due to previous abdominal pain, still feel it is important to do CT imaging of his abdomen Significant other reports that his last dose of pain medicine was 11 PM last night.  He has had decreased p.o.'s over the past several days since surgery 6:35 AM Results noted.  Patient does report epigastric and upper abdominal pain. On reexamination, he has no suprapubic tenderness.  He has bruising throughout his groin and his suprapubic region, but no focal tenderness or crepitus to suggest acute postop complication He does have epigastric abdominal tenderness.  Exam is limited due to his obesity  Consulted critical care, Dr. Arsenio Loader, he will send ICU team to evaluate and admit patient Discussed care with Dr. Lowell Guitar, nephrology, nephrology team will  see patient Redge Gainer, he agrees with medications thus far, he does recommend repeat potassium Have also consulted general surgery due to his possible cholecystitis, will give IV antibiotics and they will see in consult Patient and significant other updated on plan.  Patient maintaining airway, resting comfortably 7:25 AM D/w Dr. Retta Diones with urology.  He requests Foley placement.urology team will follow patient  Critical Care team.bedside Pt is awake/alert  Final Clinical Impressions(s) / ED Diagnoses   Final diagnoses:  AKI (acute kidney injury) (HCC)  Hyperkalemia  Metabolic acidosis    ED Discharge Orders    None       Zadie Rhine, MD 04/30/2017 [phone]

## 2017-04-19 DEATH — deceased

## 2017-04-22 ENCOUNTER — Telehealth: Payer: Self-pay

## 2017-04-22 LAB — CULTURE, BLOOD (ROUTINE X 2)
Culture: NO GROWTH
Culture: NO GROWTH
SPECIAL REQUESTS: ADEQUATE
Special Requests: ADEQUATE

## 2017-04-22 NOTE — Telephone Encounter (Signed)
On 04/22/17 I received a d/c from [employer] (original). The d/c is for cremation. The patient is a patient of Doctor Molli Knock. The d/c will be taken to [facility] for signature.  On 04/23/17 I received the d/c back from Doctor Molli Knock. I got the d/c ready and called the funeral home to let them know the d/c is ready for pickup. I also faxed a copy to the funeral home per the funeral home request.

## 2017-05-20 NOTE — Death Summary Note (Signed)
DEATH SUMMARY   Patient Details  Name: JUDD MCCUBBIN MRN: [id number] DOB: 1950-06-15  Admission/Discharge Information   Admit Date:  2017-05-04  Date of Death:    Time of Death: Time of Death: Jul 14, 1852  Length of Stay: 1  Referring Physician: Elias Else, MD   Reason(s) for Hospitalization  Refractory septic shock  Diagnoses  Preliminary cause of death:   Refractory septic shock Secondary Diagnoses (including complications and co-morbidities):  Principal Problem:   Acute kidney injury superimposed on chronic kidney disease (HCC) Active Problems:   Chronic combined systolic and diastolic heart failure, NYHA class 3 (HCC)   Type 2 diabetes mellitus with renal complication (HCC)   Hypertension   Hyperkalemia   Cardiomyopathy, EF 30-35% by echo 03/2012   Nonsustained ventricular tachycardia (HCC)   High anion gap metabolic acidosis   Gallstones   Uremic encephalopathy   Speech abnormality   Abnormal LFTs   Cardiac arrest, cause unspecified (HCC)   Acute respiratory failure with hypoxemia (HCC)   Septic shock (HCC)   Palliative care encounter   Goals of care, counseling/discussion   Brief Hospital Course (including significant findings, care, treatment, and services provided and events leading to death)  This 67 y.o. obese Caucasian male reformed smoker presented to the [facility] Emergency Department via EMS with complaints of generalized weakness, left-sided abdominal pain, slurred speech, word searching, poor oral intake.  The patient is awake, alert and oriented to time person and place.  However, the patient struggles with word searching.  Consequently, clinical history is obtained from the patient's fianc who is at the bedside.  Patient underwent surgery for placement of a penile implant on Friday (4 days ago).  The patient's fianc notes that he has had poor oral intake since surgery.  Also, she has been concerned with what she perceived to be slurred speech  and confusion.  Most notably, the patient has been struggling with word searching.  In the emergency department, the patient was found to have hyperkalemia (7.3).  Also, he has been noted to be in acute on chronic renal failure.  Based on discussion with the nursing staff, the patient apparently had a brief episode where he appeared to be apneic.  He was supported with bag mask ventilation and promptly returned to his presenting baseline mental status.  Consequently, the patient was not intubated.  He never lost pulse or pressure.  He did receive temporizing measure treatment measures for hyperkalemia (insulin, D50, calcium gluconate, 1 amp of sodium bicarbonate).  Patient developed refractory shock and respiratory failure, was intubated then cardiac arrested.  Spoke with daughters over the phone, he had made his wishes clear prior that he did not want that level of care.  Made DNR with no further escalation of care but CRRT is ok to start.  While on CRRT patient developed PEA and expired.    Pertinent Labs and Studies  Significant Diagnostic Studies Ct Abdomen Pelvis Wo Contrast  Result Date: 05/04/2017 CLINICAL DATA:  67 year old male with abdominal distention and pain. EXAM: CT ABDOMEN AND PELVIS WITHOUT CONTRAST TECHNIQUE: Multidetector CT imaging of the abdomen and pelvis was performed following the standard protocol without IV contrast. COMPARISON:  Renal ultrasound dated 05/20/2012 FINDINGS: Evaluation of this exam is limited in the absence of intravenous contrast. Lower chest: The visualized lung bases are clear. There is hypoattenuation of the cardiac blood pool suggestive of a degree of anemia. Clinical correlation is recommended. Partially visualized pacemaker wires. No intra-abdominal free air.  Small ascites. Hepatobiliary: There is slight irregularity of the liver contour which may represent early changes of cirrhosis. Correlation with clinical exam and liver function tests recommended. No  intrahepatic biliary ductal dilatation. High attenuating content within the gallbladder may represent sludge or stones. There is mild thickened and hazy appearance of the gallbladder wall which may be partly related to underdistention. Acute cholecystitis is not excluded. Further evaluation with ultrasound is recommended. Pancreas: There is minimal haziness of the peripancreatic fat. Correlation with pancreatic enzymes recommended to exclude pancreatitis. Punctate calcific density in the uncinate process of the pancreas likely sequela of chronic inflammation. Spleen: Normal in size without focal abnormality. Adrenals/Urinary Tract: The adrenal glands are unremarkable. Mild bilateral renal parenchyma atrophy. There is no hydronephrosis or nephrolithiasis on either side. The visualized ureters and urinary bladder appear unremarkable. Stomach/Bowel: There is extensive sigmoid diverticulosis with muscular hypertrophy. No definite active inflammatory changes. Scattered colonic diverticula noted. There is no bowel obstruction or active inflammation. Normal appendix. Vascular/Lymphatic: Advanced aortoiliac atherosclerotic disease. The IVC appears unremarkable on this noncontrast CT. No portal venous gas. There is no adenopathy. Reproductive: The prostate and seminal vesicles are grossly unremarkable. Penile implant with reservoir in the right hemipelvis. Other: Mild diffuse subcutaneous edema. Musculoskeletal: Degenerative changes of the spine. Moderate right hip arthritic changes. No acute osseous pathology. IMPRESSION: 1. Probable gallstone. Further evaluation with ultrasound recommended to exclude acute cholecystitis. 2. Mild peripancreatic haziness possibly related to ascites. Correlation with pancreatic enzymes recommended to exclude pancreatitis. 3. Extensive colonic diverticulosis. No bowel obstruction or active inflammation. Normal appendix. 4. Small ascites. 5. Slight irregularity of the liver contour suspicious  for early changes of cirrhosis. Clinical correlation is recommended. 6. Advanced Aortic Atherosclerosis (ICD10-I70.0). Electronically Signed   By: Elgie Collard M.D.   On: Apr 25, 2017 06:01   Ct Head Wo Contrast  Result Date: 04/25/2017 CLINICAL DATA:  67 year old male with altered level of consciousness. Dizziness for the past 24 hours. EXAM: CT HEAD WITHOUT CONTRAST TECHNIQUE: Contiguous axial images were obtained from the base of the skull through the vertex without intravenous contrast. COMPARISON:  None. FINDINGS: Brain: Patchy areas of decreased attenuation are noted throughout the deep and periventricular white matter of the cerebral hemispheres bilaterally, compatible with mild chronic microvascular ischemic disease. No evidence of acute infarction, hemorrhage, hydrocephalus, extra-axial collection or mass lesion/mass effect. Vascular: No hyperdense vessel or unexpected calcification. Skull: Normal. Negative for fracture or focal lesion. Sinuses/Orbits: No acute finding. Other: None. IMPRESSION: 1. No significant incidental noncardiac findings are noted. 2. Mild chronic microvascular ischemic changes in the cerebral white matter, as above. Electronically Signed   By: Trudie Reed M.D.   On: 04-25-2017 09:31   Dg Chest Port 1 View  Result Date: 04/25/17 CLINICAL DATA:  Hypoxia EXAM: PORTABLE CHEST 1 VIEW COMPARISON:  Study obtained earlier in the day FINDINGS: Endotracheal tube tip is 3.0 cm above the carina. Right subclavian catheter tip is in the superior vena cava. Left jugular catheter tip is at the junction of the left innominate vein and superior vena cava. No pneumothorax. There is mild right base atelectasis. Lungs elsewhere are clear. Heart is mildly enlarged with pulmonary vascularity within normal limits. No adenopathy. No appreciable bone lesions. IMPRESSION: Tube and catheter positions as described without pneumothorax. Mild right base atelectasis. Stable cardiac prominence.  Electronically Signed   By: Bretta Bang III M.D.   On: Apr 25, 2017 17:14   Dg Chest Portable 1 View  Result Date: April 25, 2017 CLINICAL DATA:  Central line placement  EXAM: PORTABLE CHEST 1 VIEW COMPARISON:  2017/04/18, 01/22/2013 FINDINGS: Endotracheal tube tip is approximately 4.3 cm superior to carina. Post sternotomy changes. Left-sided pacing device as before. Left-sided central venous catheter tip overlies the brachiocephalic confluence. Cardiomegaly with central vascular congestion. No pleural effusion or focal consolidation. No pneumothorax. IMPRESSION: 1. Endotracheal tube tip about 4.3 cm superior to carina 2. Left-sided central venous catheter tip overlies the brachiocephalic confluence. No pneumothorax 3. Cardiomegaly with mild central congestion Electronically Signed   By: Jasmine Pang M.D.   On: 2017/04/18 17:08   Dg Chest Portable 1 View  Result Date: 04/18/2017 CLINICAL DATA:  Central line placement. EXAM: PORTABLE CHEST 1 VIEW 10:51 a.m. COMPARISON:  2017-04-18 at 3:35 a.m. FINDINGS: Left IJ catheter has been inserted. The tip appears in good position in the superior vena cava region at the level of the azygos vein. No pneumothorax. Heart size and pulmonary vascularity are normal. CABG. AICD in place. Lungs are clear. No acute bone abnormality. Aortic atherosclerosis. IMPRESSION: New central line appears in good position. No acute abnormalities. Aortic Atherosclerosis (ICD10-I70.0). Electronically Signed   By: Francene Boyers M.D.   On: 18-Apr-2017 11:11   Dg Chest Port 1 View  Result Date: 2017-04-18 CLINICAL DATA:  67 year old male with shortness of breath. EXAM: PORTABLE CHEST 1 VIEW COMPARISON:  Chest radiograph dated 05/23/2012 FINDINGS: The lungs are clear. There is no pleural effusion or pneumothorax. Borderline cardiomegaly. Median sternotomy wires and left pectoral pacemaker device. No acute osseous pathology. IMPRESSION: No acute cardiopulmonary process. Electronically Signed    By: Elgie Collard M.D.   On: 18-Apr-2017 04:13   US Abdomen Limited Ruq  Result Date: 04-18-2017 CLINICAL DATA:  Abnormal gallbladder on CT earlier today. EXAM: ULTRASOUND ABDOMEN LIMITED RIGHT UPPER QUADRANT COMPARISON:  CT abdomen pelvis April 18, 2017. FINDINGS: Gallbladder: Layering echogenic sludge and stones. Gallbladder wall measures 8 mm. No pericholecystic fluid. Negative sonographic Murphy sign. Common bile duct: Diameter: 3 mm, within normal limits. Liver: Diffusely increased in echogenicity. Small perihepatic fluid. Portal vein is patent on color Doppler imaging with normal direction of blood flow towards the liver. IMPRESSION: 1. Gallbladder wall thickening with sludge and gallstones. Findings can be seen with chronic cholecystitis. 2. Liver appears steatotic. 3. Small perihepatic ascites. Electronically Signed   By: Leanna Battles M.D.   On: Apr 18, 2017 10:14    Microbiology Recent Results (from the past 240 hour(s))  Culture, blood (routine x 2)     Status: None   Collection Time: 18-Apr-2017  8:57 AM  Result Value Ref Range Status   Specimen Description   Final    BLOOD LEFT HAND Performed at [facility], [address], [place], [place] [address]    Special Requests   Final    BOTTLES DRAWN AEROBIC AND ANAEROBIC Blood Culture adequate volume Performed at [facility], [address] [place], [place] [address]    Culture   Final    NO GROWTH 5 DAYS Performed at [facility], [address] [place], [place] [address]    Report Status 04/22/2017 FINAL  Final  Culture, blood (routine x 2)     Status: None   Collection Time: 04-18-17  8:57 AM  Result Value Ref Range Status   Specimen Description   Final    BLOOD RIGHT HAND Performed at [facility], [address], [place], [place] [address]    Special Requests   Final    BOTTLES DRAWN AEROBIC AND ANAEROBIC Blood  Culture adequate volume Performed at [facility], [address] [place], [place] [address]    Culture   Final    NO GROWTH 5 DAYS Performed at [facility], [address] [place], [place] [address]    Report Status 04/22/2017 FINAL  Final  Urine culture     Status: None   Collection Time: Apr 19, 2017  9:41 AM  Result Value Ref Range Status   Specimen Description   Final    URINE, CATHETERIZED Performed at [facility], [address] [place], [place] [address]    Special Requests   Final    NONE Performed at [facility], [address], [place], [place] [address]    Culture   Final    NO GROWTH Performed at [facility], [address] [place], [place] [address]    Report Status 04/18/2017 FINAL  Final  MRSA PCR Screening     Status: None   Collection Time: 04/19/2017 12:07 PM  Result Value Ref Range Status   MRSA by PCR NEGATIVE NEGATIVE Final    Comment:        The GeneXpert MRSA Assay (FDA approved for NASAL specimens only), is one component of a comprehensive MRSA colonization surveillance program. It is not intended to diagnose MRSA infection nor to guide or monitor treatment for MRSA infections. Performed at [facility], [address] [place], [place] [address]     Lab Basic Metabolic Panel: Recent Labs  Lab 2017/04/19 [phone] 04-19-2017 0413 2017-04-19 0851 2017/04/19 0920 2017/04/19 1517 2017/04/19 1658  NA 134* 131* 134*  --  137 137  K 7.3* 7.1* 6.7* 6.5* 6.0* 5.1  CL 96* 98* 102  --  100* 101  CO2 22  --   --   --  24 18*  GLUCOSE 203* 185* 150*  --  235* 386*  BUN 189* >140* >140*  --  160* 166*  CREATININE 7.13* 6.70* 6.70*  --  6.30* 6.37*  CALCIUM 8.1*  --   --   --  7.3* 8.7*  PHOS  --   --   --   --  8.8*  --    Liver Function Tests: Recent Labs  Lab 2017-04-19 0352 04-19-2017 1517 19-Apr-2017 1658  AST 864*  --  1,826*  ALT 493*  --  942*  ALKPHOS 90  --  114  BILITOT 1.2  --  0.9  PROT 6.4*  --  4.2*  ALBUMIN  3.5 2.8* 2.4*   Recent Labs  Lab 04-19-2017 0352  LIPASE 152*   Recent Labs  Lab 19-Apr-2017 0920  AMMONIA 48*   CBC: Recent Labs  Lab Apr 19, 2017 0352 04/19/17 0413 19-Apr-2017 0851 04/19/2017 1650  WBC 19.1*  --   --  16.6*  NEUTROABS 16.9*  --   --   --   HGB 12.7* 13.9 12.2* 10.4*  HCT 40.1 41.0 36.0* 33.5*  MCV 106.6*  --   --  107.0*  PLT 216  --   --  132*   Cardiac Enzymes: Recent Labs  Lab Apr 19, 2017 0845  TROPONINI 0.26*   Sepsis Labs: Recent Labs  Lab Apr 19, 2017 0352 April 19, 2017 0413 2017-04-19 0845 04/19/17 0920 04-19-2017 1636 Apr 19, 2017 1650  PROCALCITON  --   --  1.34  --   --   --   WBC 19.1*  --   --   --   --  16.6*  LATICACIDVEN  --  2.76*  --  1.1 6.7*  --     Procedures/Operations     Maddock Finigan 04/22/2017, 7:29 PM

## 2018-08-30 IMAGING — DX DG CHEST 1V PORT
1 series · 1 of 1 positions shown · non-contrast
Comparison: Chest radiograph dated 05/23/2012

CLINICAL DATA: 67-year-old male with shortness of breath.

EXAM:
PORTABLE CHEST 1 VIEW

[chest ap]
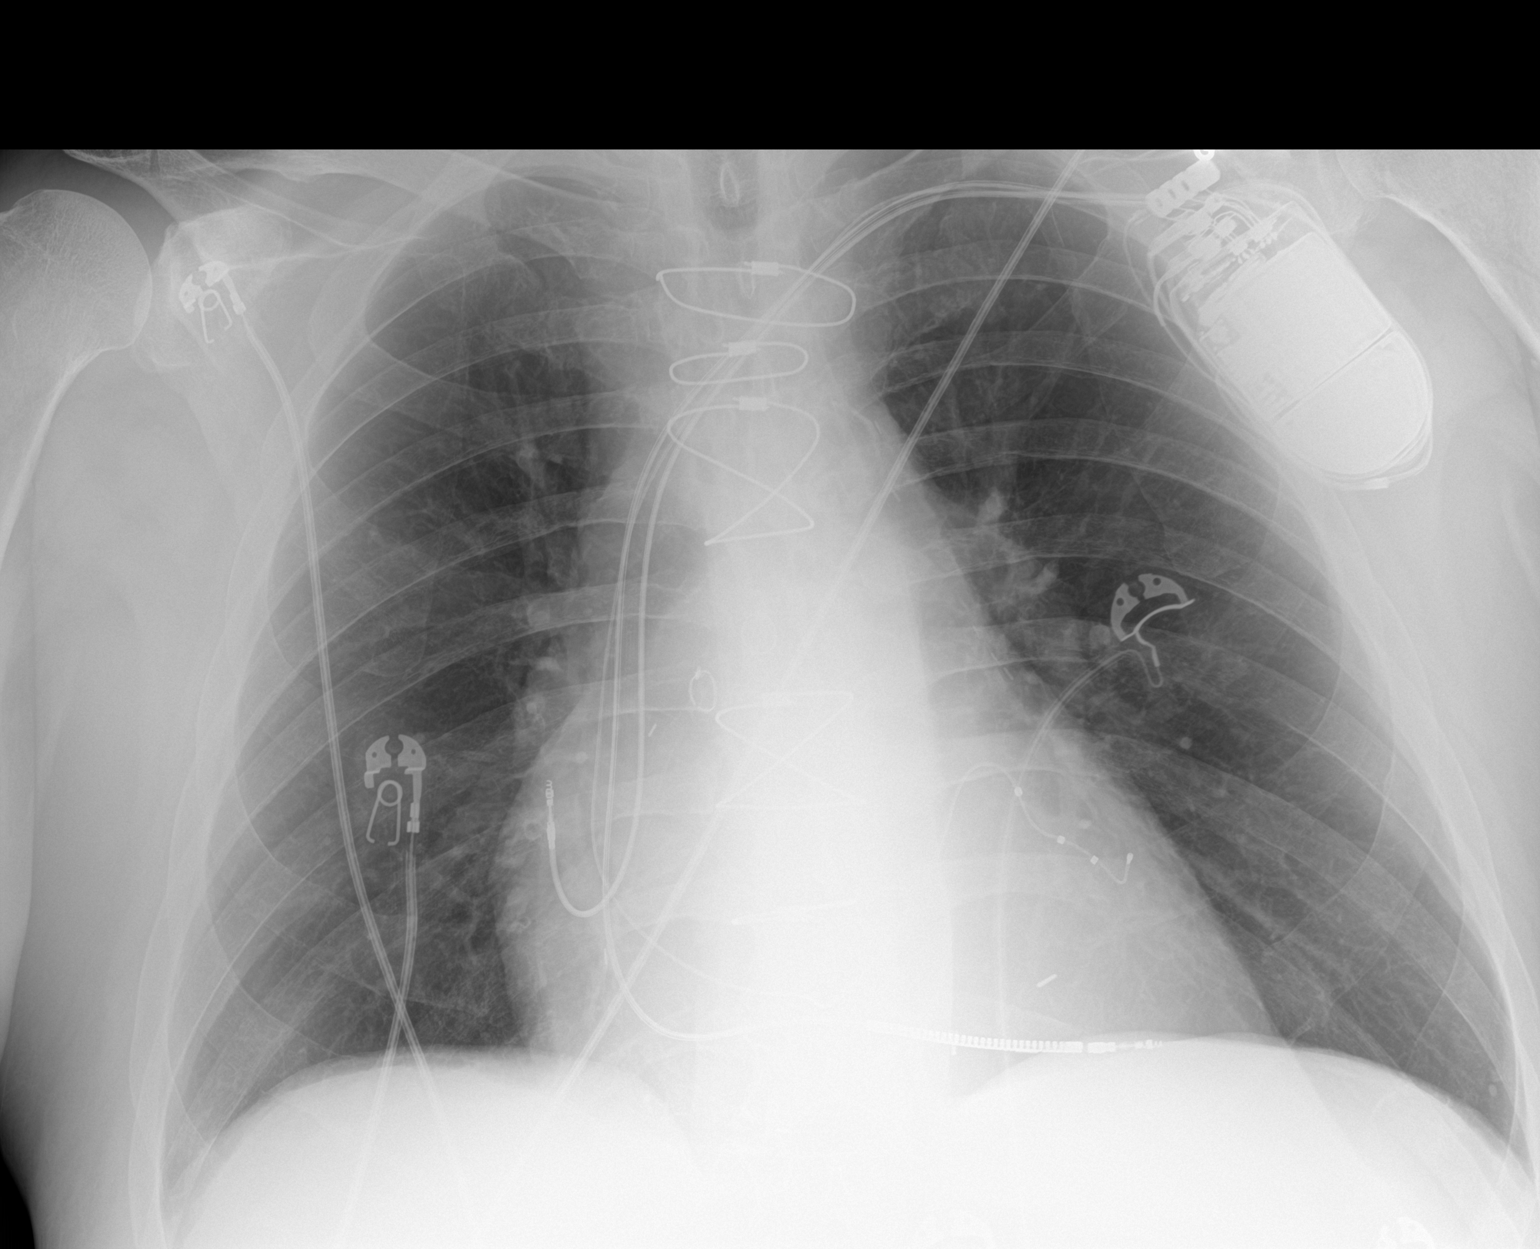

[1 of 1 positions shown; findings below may reference images not displayed]

FINDINGS: The lungs are clear. There is no pleural effusion or pneumothorax.
Borderline cardiomegaly. Median sternotomy wires and left pectoral
pacemaker device. No acute osseous pathology.
IMPRESSION: No acute cardiopulmonary process.

## 2018-08-30 IMAGING — CT CT ABD-PELV W/O CM
2 of 4 series · 15 of 46 positions shown, 17 images · non-contrast
Comparison: Renal ultrasound dated 05/20/2012

CLINICAL DATA: 59-year-old male with abdominal distention and pain.

EXAM:
CT ABDOMEN AND PELVIS WITHOUT CONTRAST
TECHNIQUE: Multidetector CT imaging of the abdomen and pelvis was performed
following the standard protocol without IV contrast.

[Series 2: axial st · axial · 0.81mm/px · z∈[+986,+1376]mm · 12 of 88 slices shown, 14 images]
[im 5/88  soft-tissue]
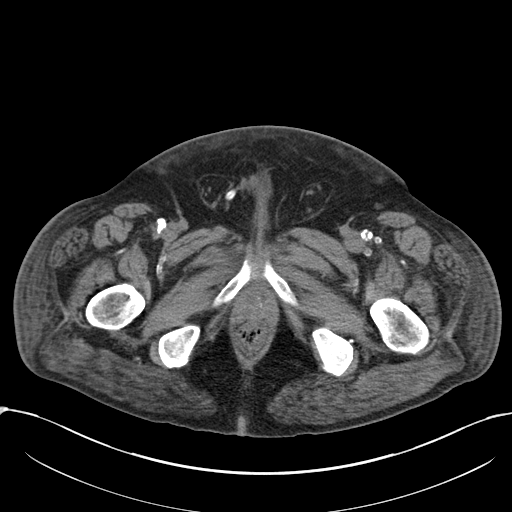
[im 5/88  bone]
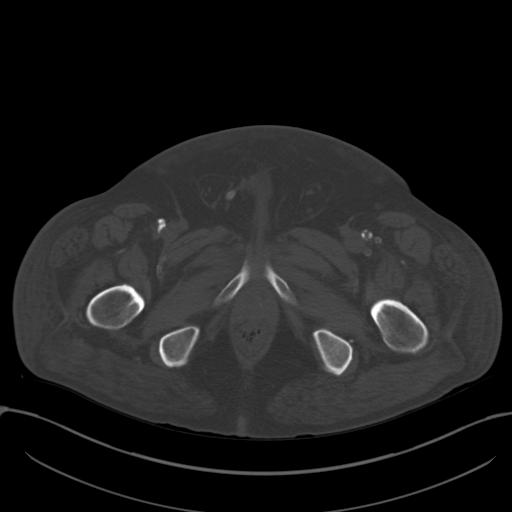
[im 13/88  soft-tissue]
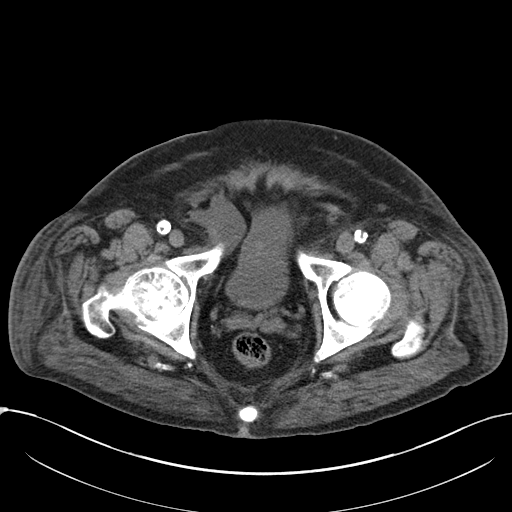
[im 21/88  soft-tissue]
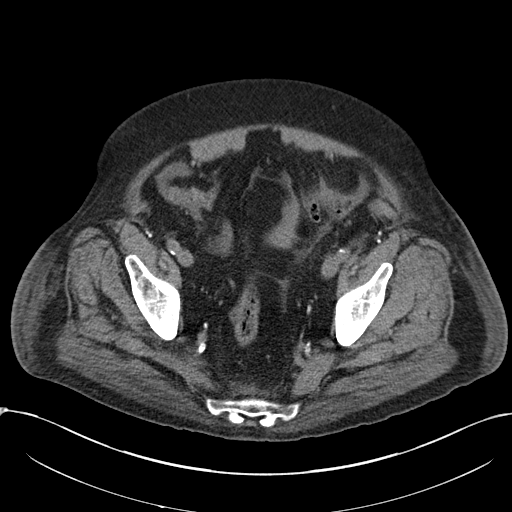
[im 25/88  soft-tissue]
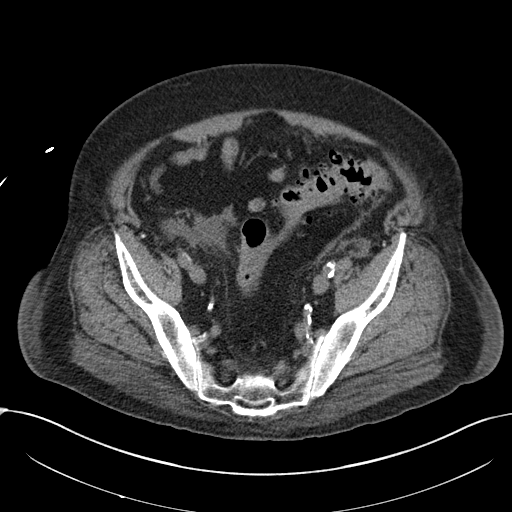
[im 34/88  soft-tissue]
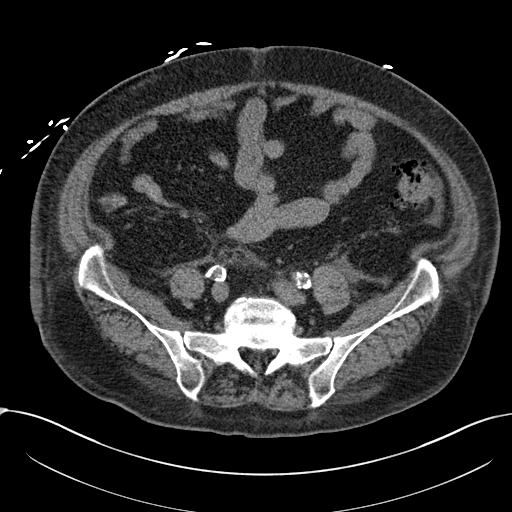
[im 42/88  soft-tissue]
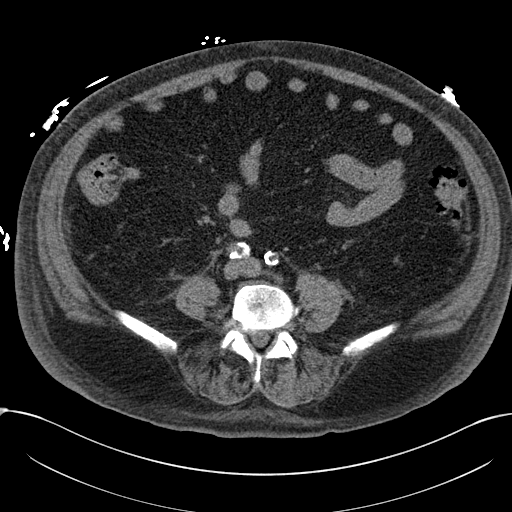
[im 46/88  soft-tissue]
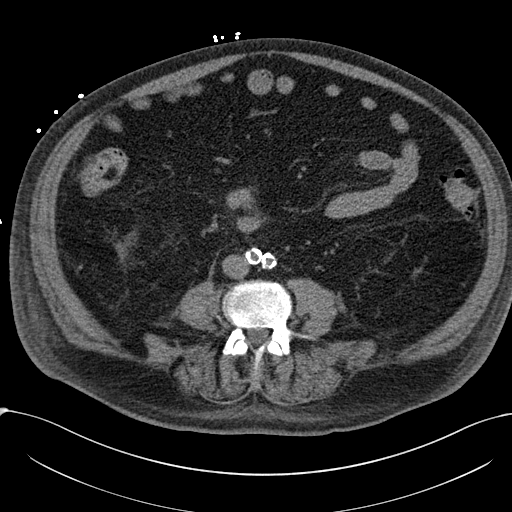
[im 54/88  soft-tissue]
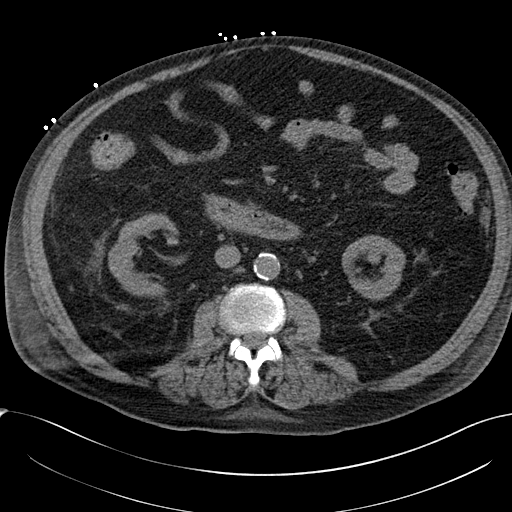
[im 63/88  soft-tissue]
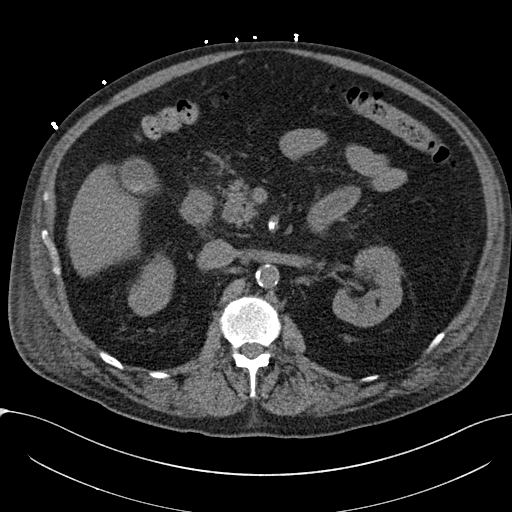
[im 63/88  bone]
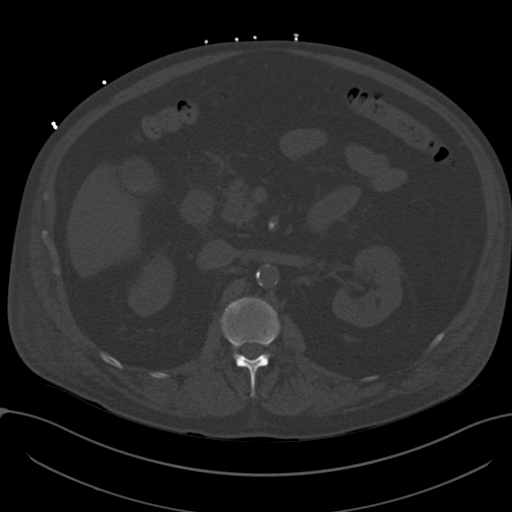
[im 67/88  soft-tissue]
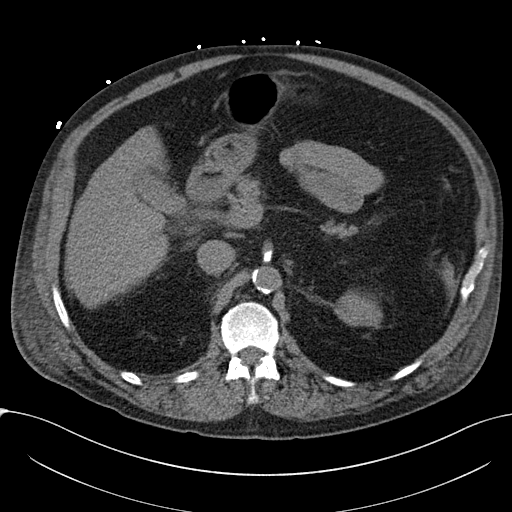
[im 75/88  soft-tissue]
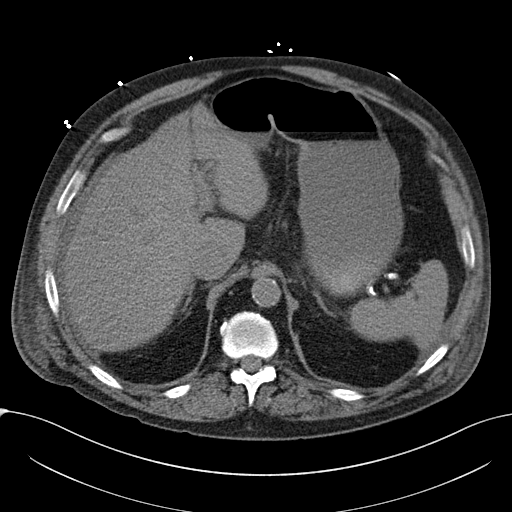
[im 83/88  soft-tissue]
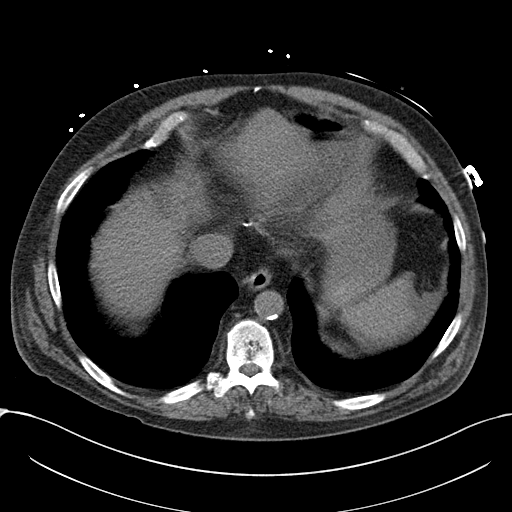

[Series 5: coronal st · coronal · 0.81mm/px · 3 of 107 slices shown]
[im 36/107  soft-tissue]
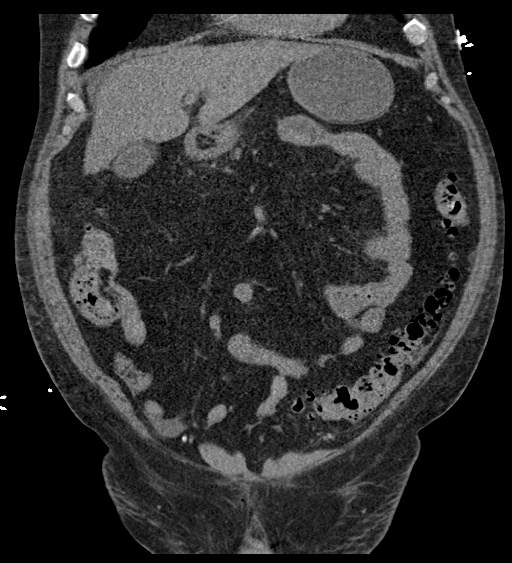
[im 48/107  soft-tissue]
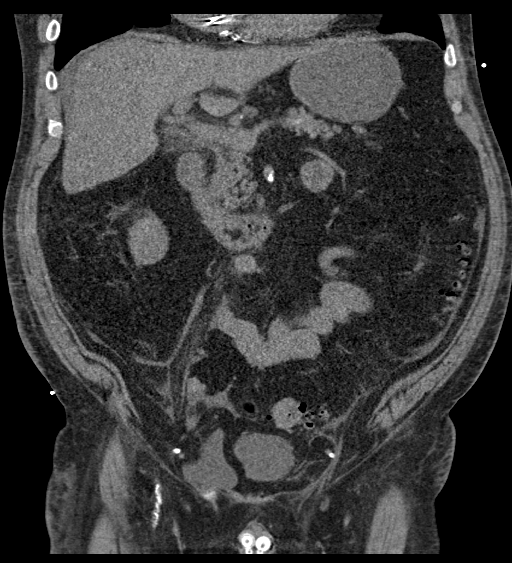
[im 59/107  soft-tissue]
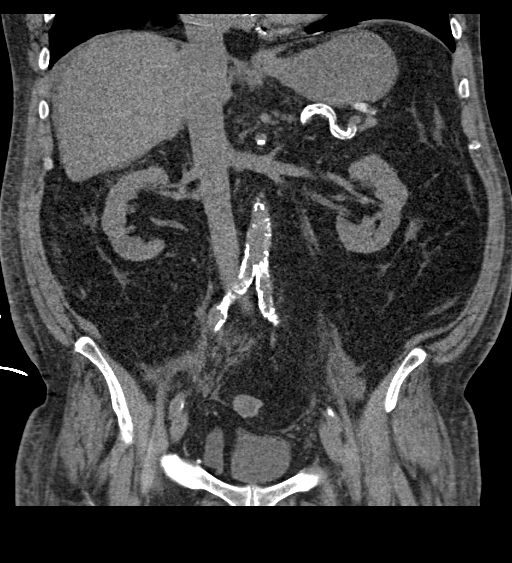

[15 of 46 positions shown; findings below may reference images not displayed]

FINDINGS: Evaluation of this exam is limited in the absence of intravenous
contrast.

Lower chest: The visualized lung bases are clear. There is
hypoattenuation of the cardiac blood pool suggestive of a degree of
anemia. Clinical correlation is recommended. Partially visualized
pacemaker wires.

No intra-abdominal free air.  Small ascites.

Hepatobiliary: There is slight irregularity of the liver contour
which may represent early changes of cirrhosis. Correlation with
clinical exam and liver function tests recommended. No intrahepatic
biliary ductal dilatation. High attenuating content within the
gallbladder may represent sludge or stones. There is mild thickened
and hazy appearance of the gallbladder wall which may be partly
related to underdistention. Acute cholecystitis is not excluded.
Further evaluation with ultrasound is recommended.

Pancreas: There is minimal haziness of the peripancreatic fat.
Correlation with pancreatic enzymes recommended to exclude
pancreatitis. Punctate calcific density in the uncinate process of
the pancreas likely sequela of chronic inflammation.

Spleen: Normal in size without focal abnormality.

Adrenals/Urinary Tract: The adrenal glands are unremarkable. Mild
bilateral renal parenchyma atrophy. There is no hydronephrosis or
nephrolithiasis on either side. The visualized ureters and urinary
bladder appear unremarkable.

Stomach/Bowel: There is extensive sigmoid diverticulosis with
muscular hypertrophy. No definite active inflammatory changes.
Scattered colonic diverticula noted. There is no bowel obstruction
or active inflammation. Normal appendix.

Vascular/Lymphatic: Advanced aortoiliac atherosclerotic disease. The
IVC appears unremarkable on this noncontrast CT. No portal venous
gas. There is no adenopathy.

Reproductive: The prostate and seminal vesicles are grossly
unremarkable. Penile implant with reservoir in the right hemipelvis.

Other: Mild diffuse subcutaneous edema.

Musculoskeletal: Degenerative changes of the spine. Moderate right
hip arthritic changes. No acute osseous pathology.
IMPRESSION: 1. Probable gallstone. Further evaluation with ultrasound
recommended to exclude acute cholecystitis.
2. Mild peripancreatic haziness possibly related to ascites.
Correlation with pancreatic enzymes recommended to exclude
pancreatitis.
3. Extensive colonic diverticulosis. No bowel obstruction or active
inflammation. Normal appendix.
4. Small ascites.
5. Slight irregularity of the liver contour suspicious for early
changes of cirrhosis. Clinical correlation is recommended.
6. Advanced Aortic Atherosclerosis ([id number]).

## 2019-12-28 IMAGING — US US ABDOMEN LIMITED
1 series · 14 of 25 positions shown · non-contrast
Comparison: CT abdomen pelvis 04/17/2017.

CLINICAL DATA: Abnormal gallbladder on CT earlier today.

EXAM:
ULTRASOUND ABDOMEN LIMITED RIGHT UPPER QUADRANT

[Series 1: us abdomen limited · 0.26mm/px · 14 of 44 slices shown]
[im 1/44]
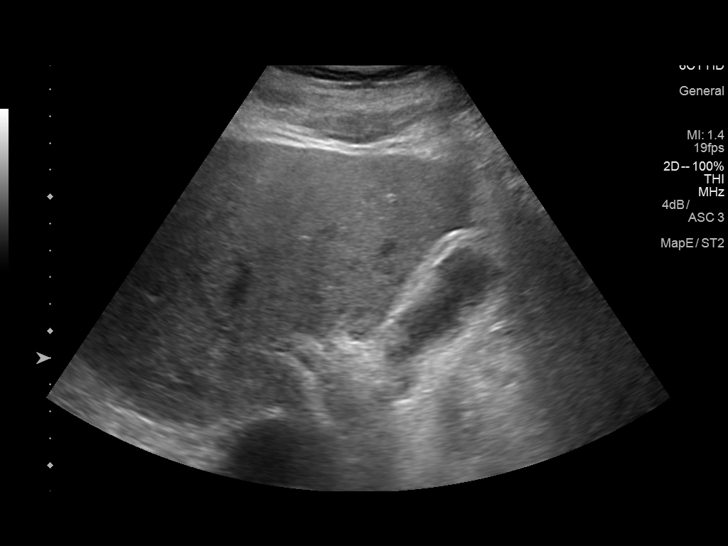
[im 4/44]
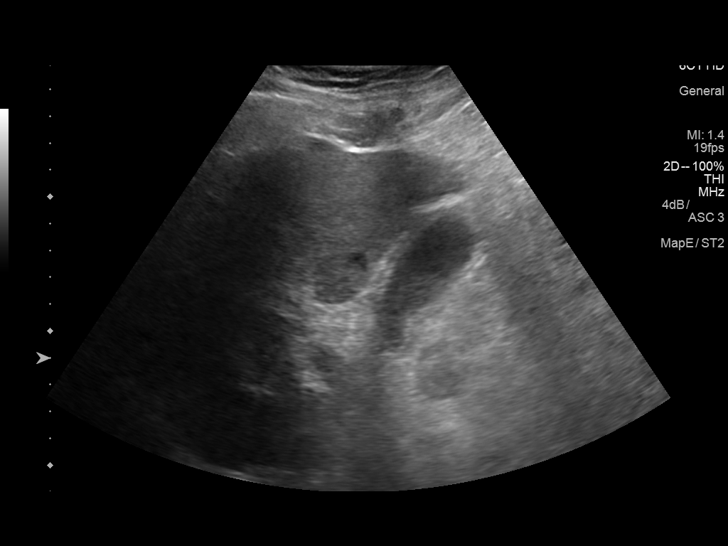
[im 8/44]
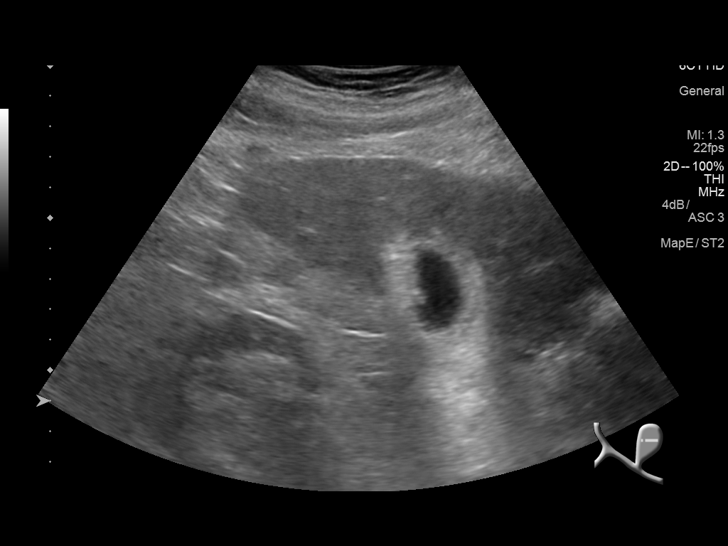
[im 11/44]
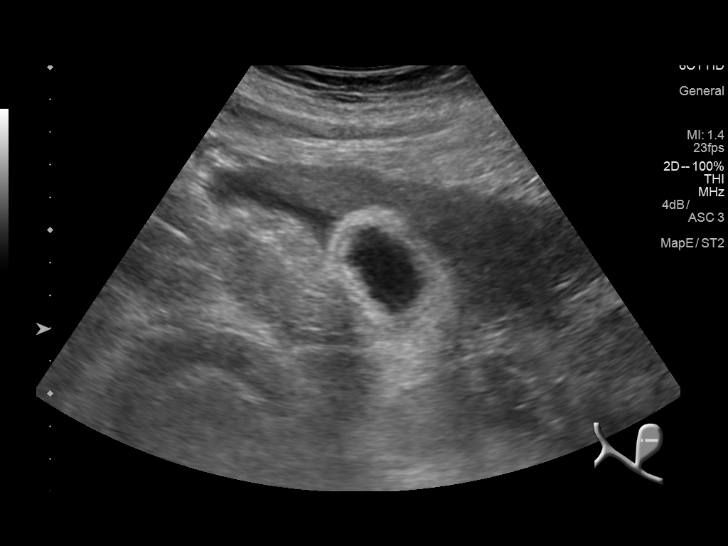
[im 15/44]
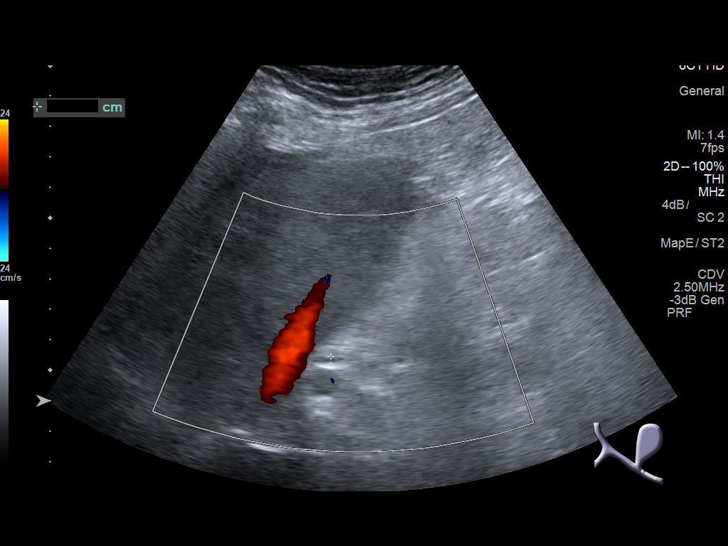
[im 17/44]
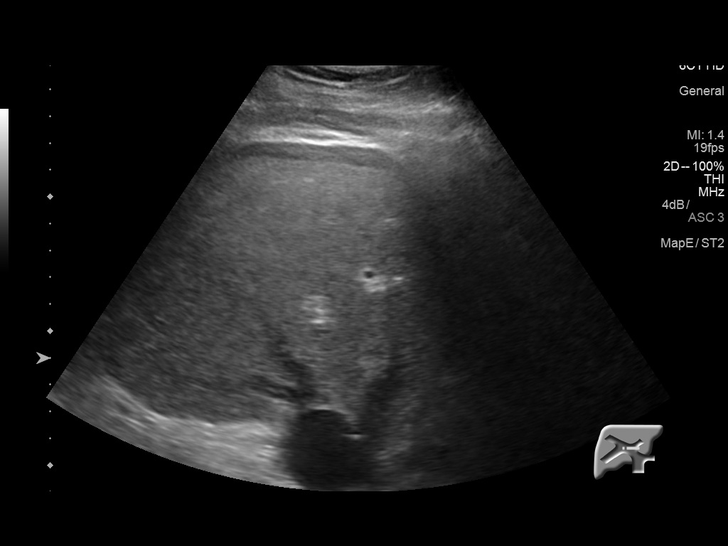
[im 20/44]
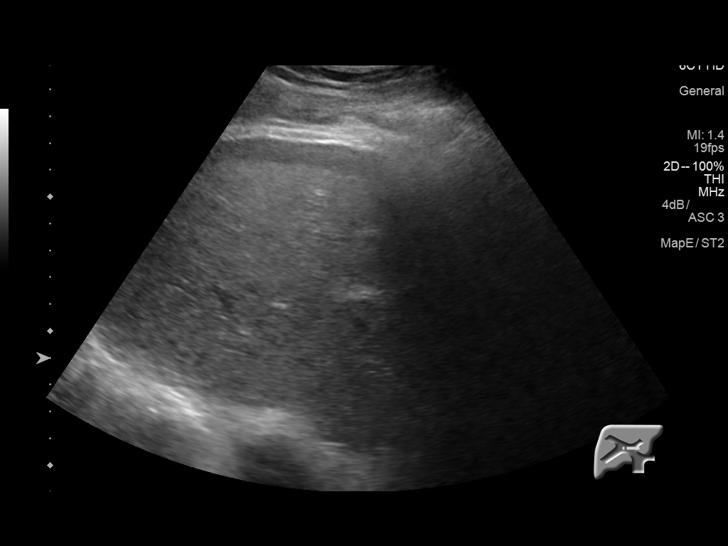
[im 24/44]
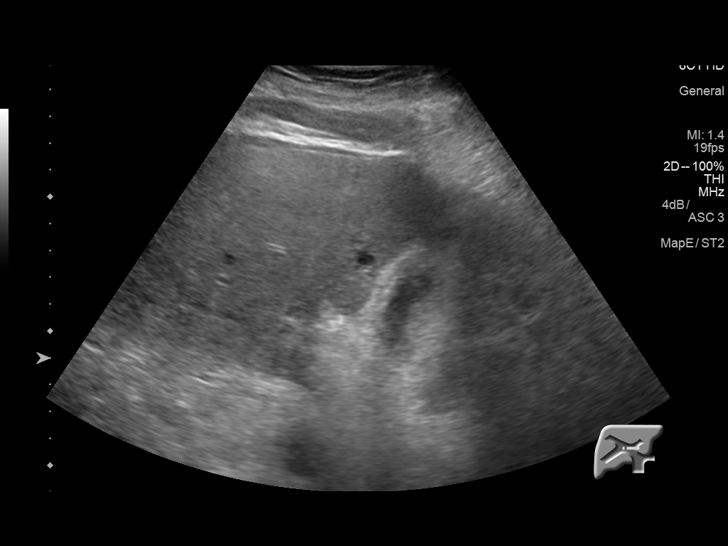
[im 27/44]
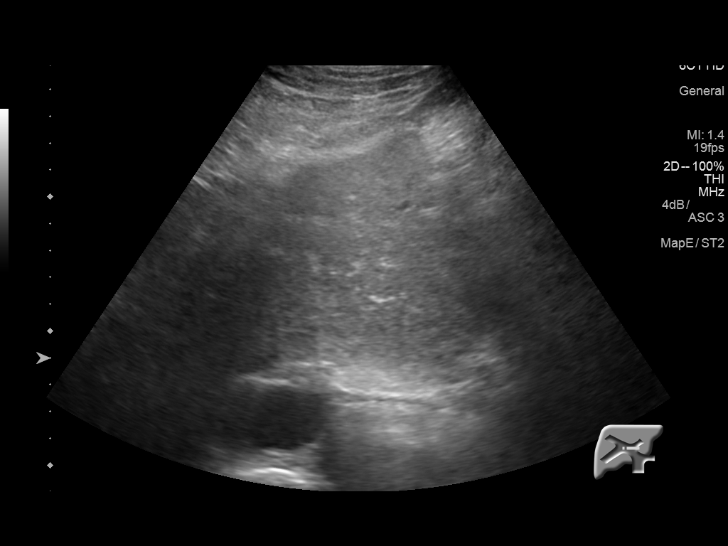
[im 29/44]
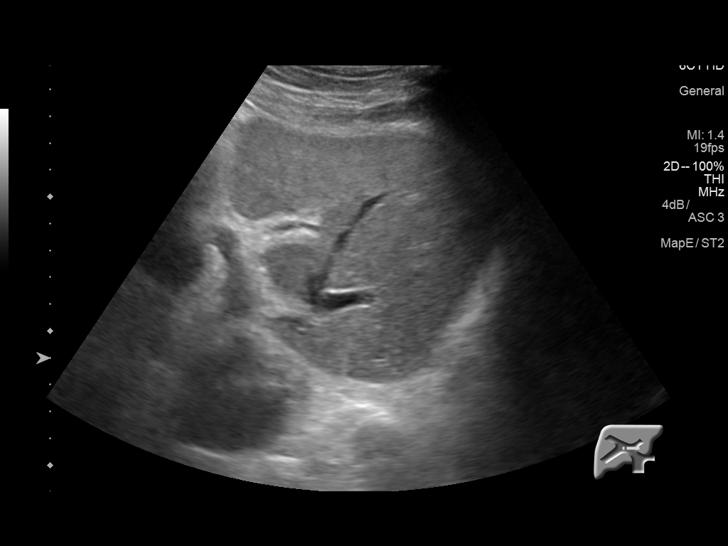
[im 33/44]
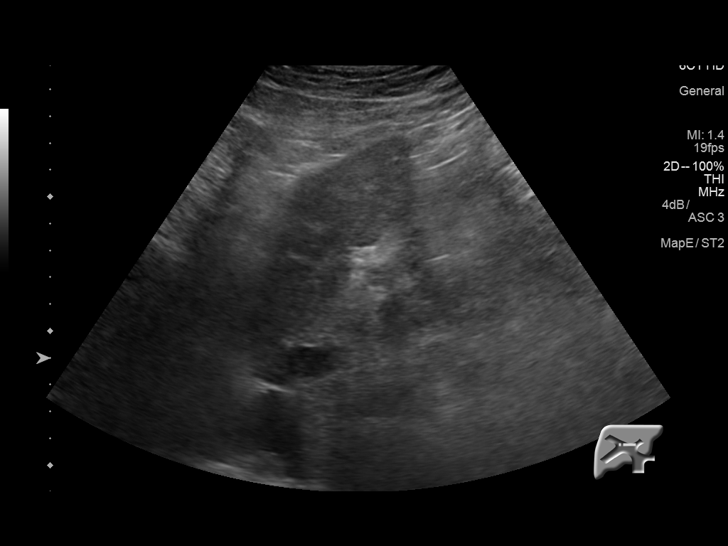
[im 36/44]
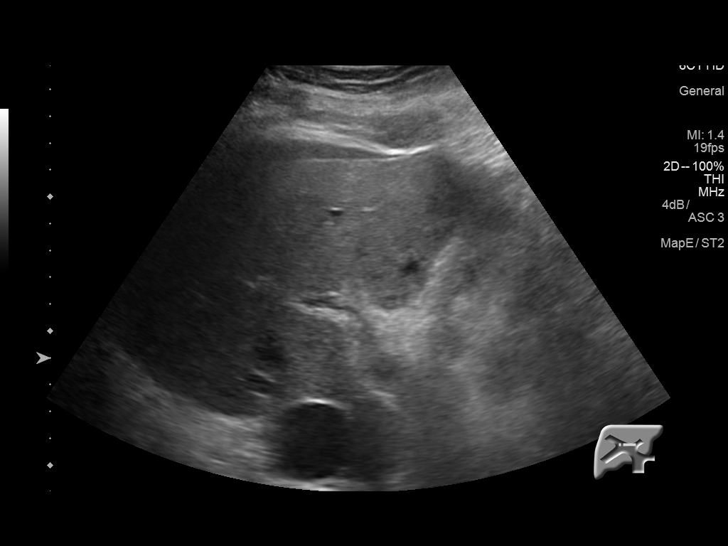
[im 40/44]
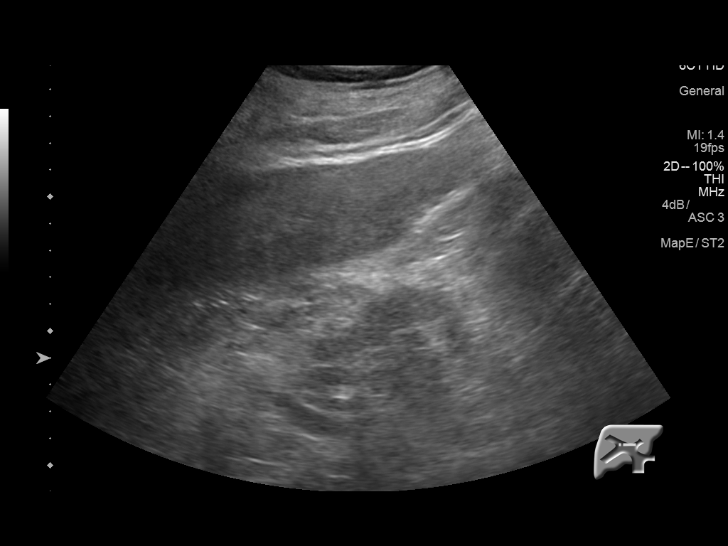
[im 44/44]
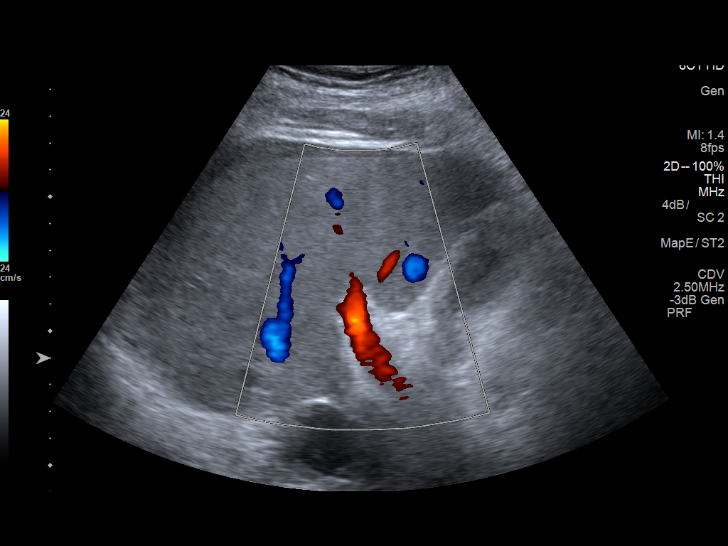

[14 of 25 positions shown; findings below may reference images not displayed]

FINDINGS: Gallbladder:

Layering echogenic sludge and stones. Gallbladder wall measures 8
mm. No pericholecystic fluid. Negative sonographic Murphy sign.

Common bile duct:

Diameter: 3 mm, within normal limits.

Liver:

Diffusely increased in echogenicity. Small perihepatic fluid. Portal
vein is patent on color Doppler imaging with normal direction of
blood flow towards the liver.
IMPRESSION: 1. Gallbladder wall thickening with sludge and gallstones. Findings
can be seen with chronic cholecystitis.
2. Liver appears steatotic.
3. Small perihepatic ascites.
